# Patient Record
Sex: Male | Born: 1970
Health system: Southern US, Community
[De-identification: ages and names within clinical notes are randomized; demographics above are authoritative.]

## PROBLEM LIST (undated history)

## (undated) DIAGNOSIS — Z8042 Family history of malignant neoplasm of prostate: Secondary | ICD-10-CM

## (undated) DIAGNOSIS — E669 Obesity, unspecified: Secondary | ICD-10-CM

## (undated) DIAGNOSIS — K429 Umbilical hernia without obstruction or gangrene: Secondary | ICD-10-CM

## (undated) DIAGNOSIS — I10 Essential (primary) hypertension: Secondary | ICD-10-CM

## (undated) DIAGNOSIS — U071 COVID-19: Secondary | ICD-10-CM

## (undated) DIAGNOSIS — E7800 Pure hypercholesterolemia, unspecified: Secondary | ICD-10-CM

## (undated) DIAGNOSIS — Z8679 Personal history of other diseases of the circulatory system: Secondary | ICD-10-CM

## (undated) DIAGNOSIS — C20 Malignant neoplasm of rectum: Secondary | ICD-10-CM

## (undated) DIAGNOSIS — E119 Type 2 diabetes mellitus without complications: Secondary | ICD-10-CM

## (undated) DIAGNOSIS — Z803 Family history of malignant neoplasm of breast: Secondary | ICD-10-CM

## (undated) DIAGNOSIS — L4050 Arthropathic psoriasis, unspecified: Secondary | ICD-10-CM

## (undated) HISTORY — DX: Family history of malignant neoplasm of breast: Z80.3

## (undated) HISTORY — DX: Family history of malignant neoplasm of prostate: Z80.42

## (undated) HISTORY — PX: OTHER SURGICAL HISTORY: SHX169

## (undated) HISTORY — DX: Obesity, unspecified: E66.9

## (undated) HISTORY — DX: Essential (primary) hypertension: I10

## (undated) HISTORY — DX: Type 2 diabetes mellitus without complications: E11.9

---

## 2014-09-03 ENCOUNTER — Encounter: Payer: BC Managed Care – PPO | Attending: Physician Assistant

## 2014-09-03 VITALS — Ht 67.5 in | Wt 282.8 lb

## 2014-09-03 DIAGNOSIS — E119 Type 2 diabetes mellitus without complications: Secondary | ICD-10-CM | POA: Insufficient documentation

## 2014-09-03 DIAGNOSIS — Z713 Dietary counseling and surveillance: Secondary | ICD-10-CM | POA: Diagnosis not present

## 2014-09-03 NOTE — Progress Notes (Signed)
Patient was seen on 09/03/14 for the first of a series of three diabetes self-management courses at the [facility].  Patient Education Plan per assessed needs and concerns is to attend four course education program for Diabetes Self Management Education.  The following learning objectives were met by the patient during this class:  Describe diabetes  State some common risk factors for diabetes  Defines the role of glucose and insulin  Identifies type of diabetes and pathophysiology  Describe the relationship between diabetes and cardiovascular risk  State the members of the Healthcare Team  States the rationale for glucose monitoring  State when to test glucose  State their individual Target Range  State the importance of logging glucose readings  Describe how to interpret glucose readings  Identifies A1C target  Explain the correlation between A1c and eAG values  State symptoms and treatment of high blood glucose  State symptoms and treatment of low blood glucose  Explain proper technique for glucose testing  Identifies proper sharps disposal  Handouts given during class include:  Living Well with Diabetes book  Carb Counting and Meal Planning book  Meal Plan Card  Carbohydrate guide  Meal planning worksheet  Low Sodium Flavoring Tips  The diabetes portion plate  G8U to eAG Conversion Chart  Diabetes Medications  Diabetes Recommended Care Schedule  Support Group  Diabetes Success Plan  Core Class Satisfaction Survey  Follow-Up Plan:  Attend core 2

## 2014-09-04 ENCOUNTER — Ambulatory Visit
Admission: RE | Admit: 2014-09-04 | Discharge: 2014-09-04 | Disposition: A | Discharge status: Home / Self Care | Payer: BC Managed Care – PPO | Source: Ambulatory Visit | Attending: Physician Assistant | Admitting: Physician Assistant

## 2014-09-04 ENCOUNTER — Other Ambulatory Visit: Payer: Self-pay | Admitting: Physician Assistant

## 2014-09-04 DIAGNOSIS — R52 Pain, unspecified: Secondary | ICD-10-CM

## 2014-09-04 IMAGING — CR DG KNEE COMPLETE 4+V*R*
4 series · 4 of 4 positions shown · non-contrast
Comparison: None.

CLINICAL DATA: Pain and swelling.  Trauma 1 month prior

EXAM:
RIGHT KNEE - COMPLETE 4+ VIEW

[t knee ap right]
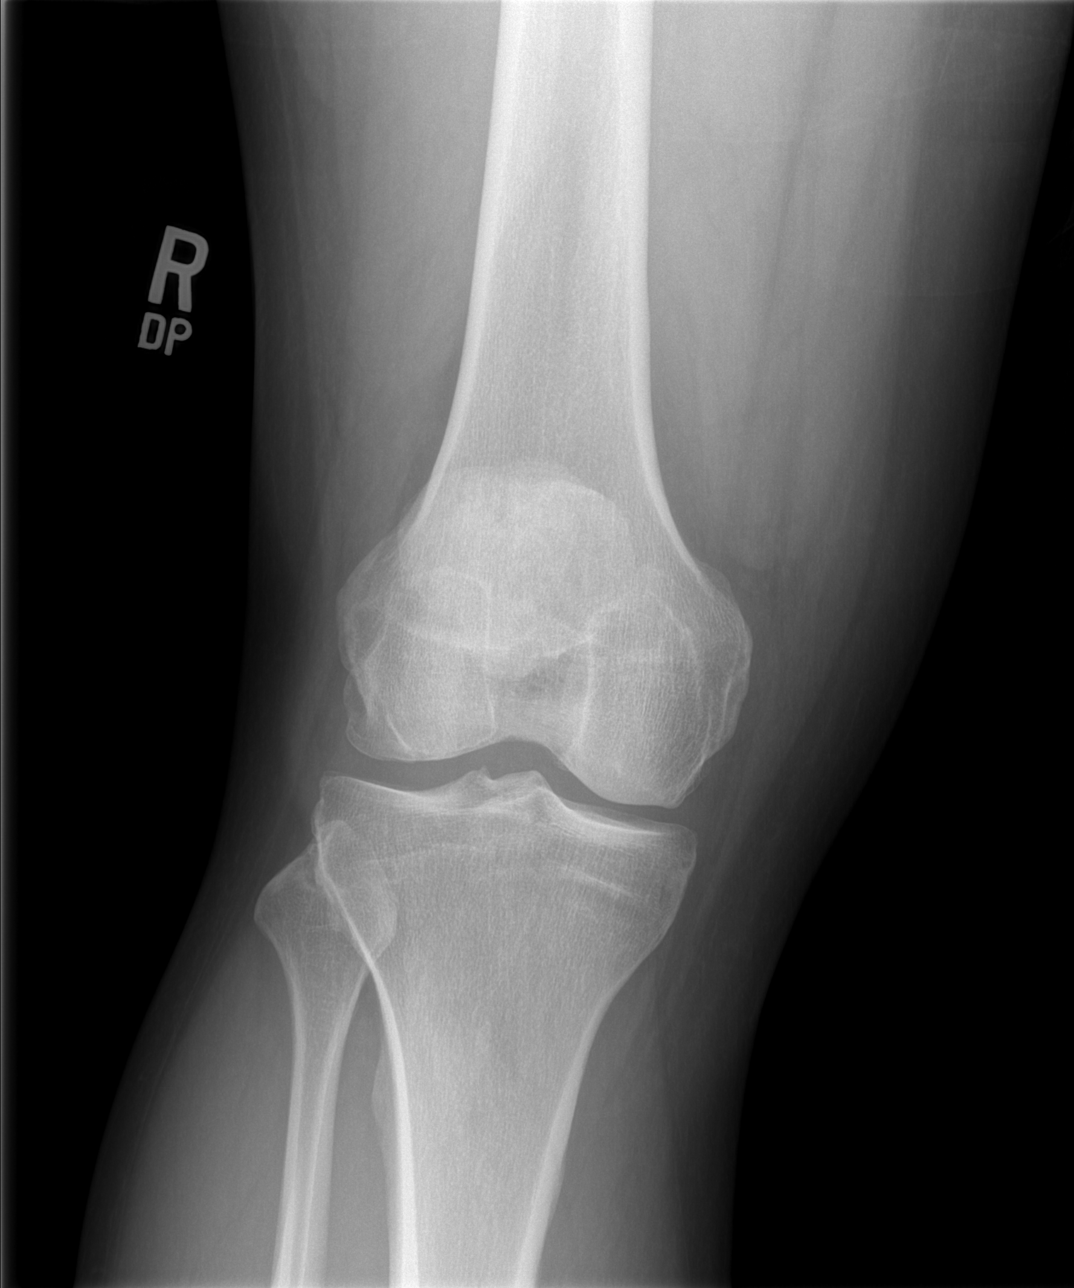

[t knee oblique right (1 of 2)]
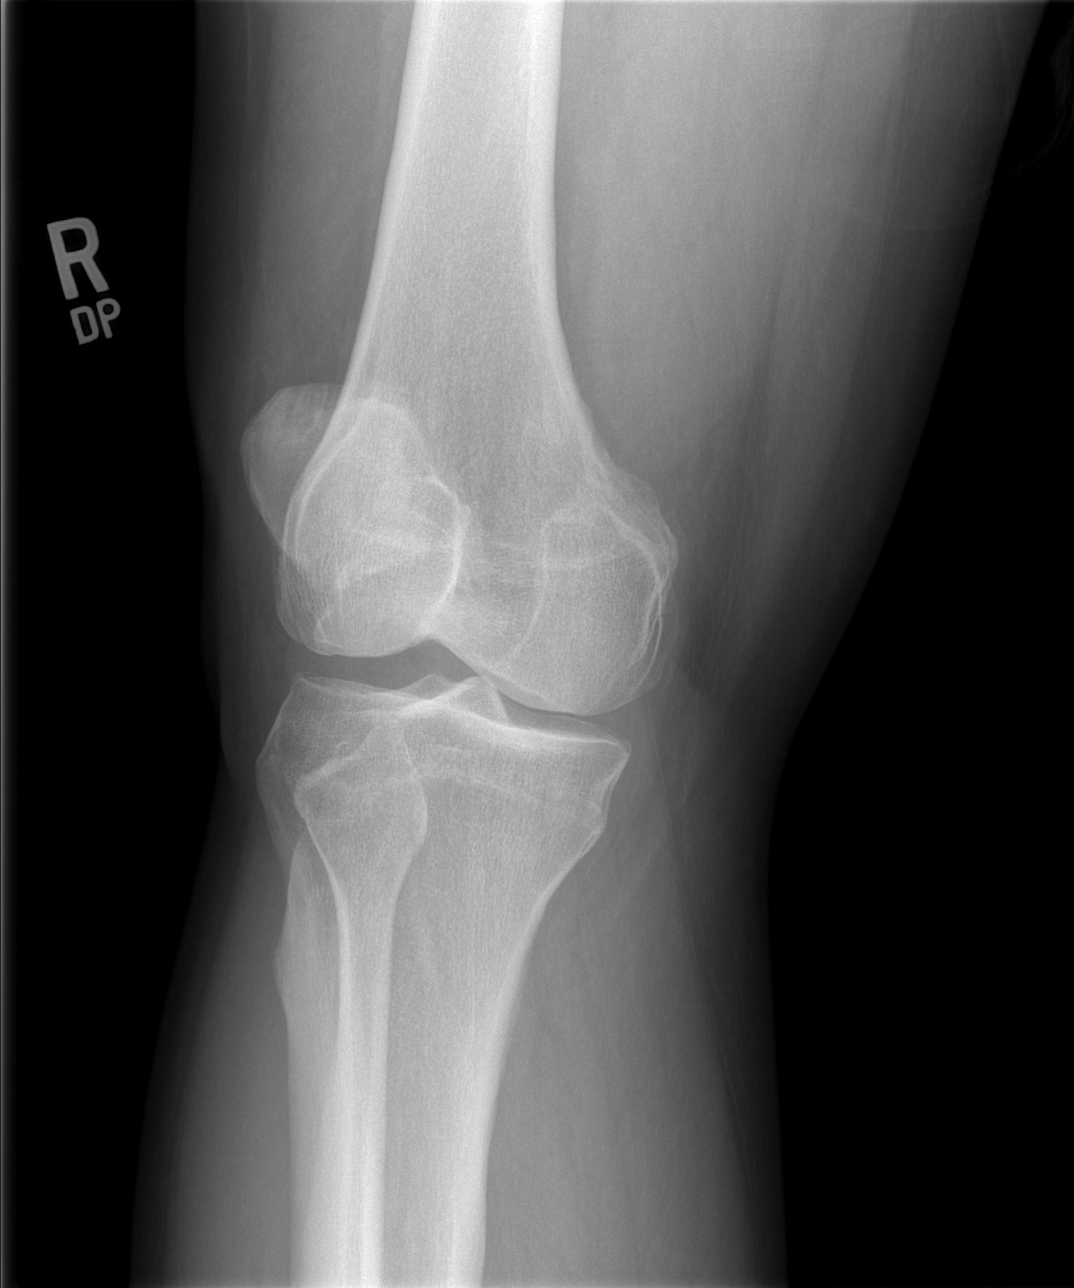

[t knee oblique right (2 of 2)]
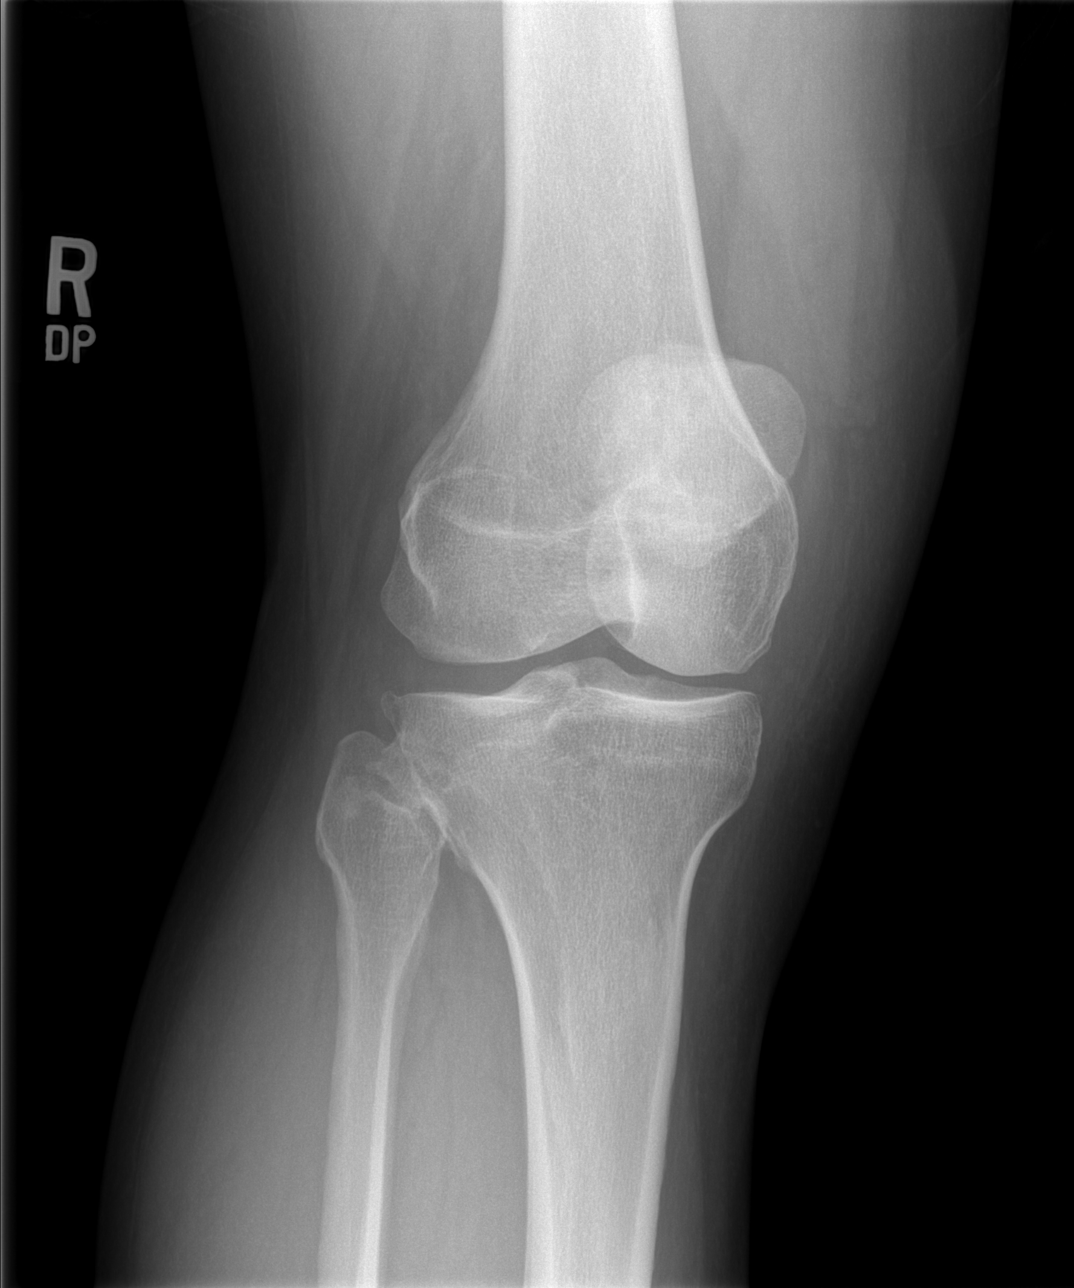

[t knee lat right]
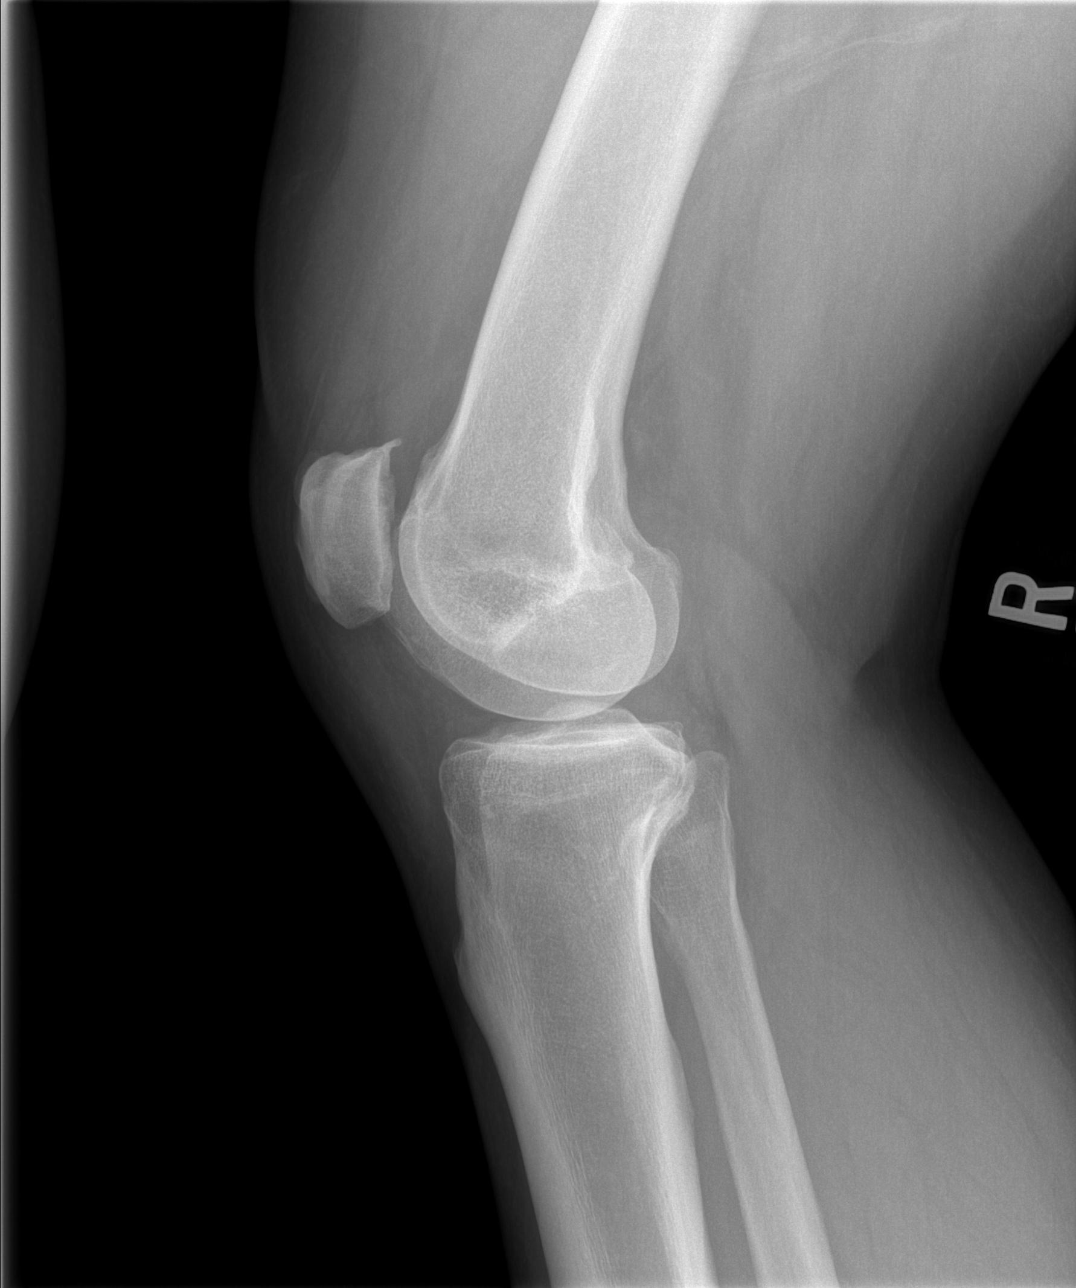

[4 of 4 positions shown; findings below may reference images not displayed]

FINDINGS: Frontal, lateral, and bilateral oblique views were obtained. There
is no fracture or dislocation. No appreciable knee effusion. There
is patellofemoral joint space narrowing with a spur arising from the
posterior superior patella. There is minimal narrowing medially.
There is slight spurring laterally. No erosive change.
IMPRESSION: Osteoarthritic change, primarily in the patellofemoral joint. No
fracture or effusion.

## 2014-09-10 ENCOUNTER — Encounter: Payer: BC Managed Care – PPO | Attending: *Deleted

## 2014-09-10 DIAGNOSIS — Z713 Dietary counseling and surveillance: Secondary | ICD-10-CM | POA: Diagnosis not present

## 2014-09-10 DIAGNOSIS — E119 Type 2 diabetes mellitus without complications: Secondary | ICD-10-CM | POA: Diagnosis present

## 2014-09-10 NOTE — Progress Notes (Signed)

## 2014-09-17 DIAGNOSIS — E119 Type 2 diabetes mellitus without complications: Secondary | ICD-10-CM | POA: Diagnosis not present

## 2014-09-23 NOTE — Progress Notes (Signed)
Patient was seen on 09/17/14 for the third of a series of three diabetes self-management courses at the [facility]. The following learning objectives were met by the patient during this class:  . State the amount of activity recommended for healthy living . Describe activities suitable for individual needs . Identify ways to regularly incorporate activity into daily life . Identify barriers to activity and ways to over come these barriers  Identify diabetes medications being personally used and their primary action for lowering glucose and possible side effects . Describe role of stress on blood glucose and develop strategies to address psychosocial issues . Identify diabetes complications and ways to prevent them  Explain how to manage diabetes during illness . Evaluate success in meeting personal goal . Establish 2-3 goals that they will plan to diligently work on until they return for the  19-monthfollow-up visit  Goals:   I will count my carb choices at most meals and snacks  I will be active 30 minutes or more 5 times a week  I will take my diabetes medications as scheduled  I will eat less unhealthy fats by eating less Fast Food  I will test my glucose at least 1 times a day, 6 days a week  I will look at patterns in my record book at least 4 days a month  Your patient has identified these potential barriers to change:  Motivation  Your patient has identified their diabetes self-care support plan as  Family Support On-line Resources  Plan:  Attend Core 4 in 4 months

## 2014-09-28 ENCOUNTER — Ambulatory Visit (HOSPITAL_BASED_OUTPATIENT_CLINIC_OR_DEPARTMENT_OTHER): Payer: BC Managed Care – PPO | Admitting: Anesthesiology

## 2014-09-28 ENCOUNTER — Ambulatory Visit (HOSPITAL_BASED_OUTPATIENT_CLINIC_OR_DEPARTMENT_OTHER)
Admission: RE | Admit: 2014-09-28 | Discharge: 2014-09-28 | Disposition: A | Discharge status: Home / Self Care | Payer: BC Managed Care – PPO | Source: Ambulatory Visit | Attending: Orthopedic Surgery | Admitting: Orthopedic Surgery

## 2014-09-28 ENCOUNTER — Encounter (HOSPITAL_BASED_OUTPATIENT_CLINIC_OR_DEPARTMENT_OTHER): Payer: Self-pay

## 2014-09-28 ENCOUNTER — Encounter (HOSPITAL_BASED_OUTPATIENT_CLINIC_OR_DEPARTMENT_OTHER)
Admission: RE | Disposition: A | Discharge status: Home / Self Care | Payer: Self-pay | Source: Ambulatory Visit | Attending: Orthopedic Surgery

## 2014-09-28 ENCOUNTER — Other Ambulatory Visit: Payer: Self-pay | Admitting: Orthopedic Surgery

## 2014-09-28 DIAGNOSIS — Y929 Unspecified place or not applicable: Secondary | ICD-10-CM | POA: Insufficient documentation

## 2014-09-28 DIAGNOSIS — Y999 Unspecified external cause status: Secondary | ICD-10-CM | POA: Diagnosis not present

## 2014-09-28 DIAGNOSIS — M25561 Pain in right knee: Secondary | ICD-10-CM | POA: Diagnosis present

## 2014-09-28 DIAGNOSIS — Z79899 Other long term (current) drug therapy: Secondary | ICD-10-CM | POA: Insufficient documentation

## 2014-09-28 DIAGNOSIS — Y939 Activity, unspecified: Secondary | ICD-10-CM | POA: Insufficient documentation

## 2014-09-28 DIAGNOSIS — X58XXXA Exposure to other specified factors, initial encounter: Secondary | ICD-10-CM | POA: Insufficient documentation

## 2014-09-28 DIAGNOSIS — M65861 Other synovitis and tenosynovitis, right lower leg: Secondary | ICD-10-CM | POA: Diagnosis not present

## 2014-09-28 DIAGNOSIS — E669 Obesity, unspecified: Secondary | ICD-10-CM | POA: Diagnosis not present

## 2014-09-28 DIAGNOSIS — E119 Type 2 diabetes mellitus without complications: Secondary | ICD-10-CM | POA: Diagnosis not present

## 2014-09-28 DIAGNOSIS — S83241A Other tear of medial meniscus, current injury, right knee, initial encounter: Secondary | ICD-10-CM | POA: Insufficient documentation

## 2014-09-28 HISTORY — PX: CHONDROPLASTY: SHX5177

## 2014-09-28 HISTORY — PX: SYNOVECTOMY: SHX5180

## 2014-09-28 HISTORY — PX: KNEE ARTHROSCOPY WITH MEDIAL MENISECTOMY: SHX5651

## 2014-09-28 LAB — SYNOVIAL CELL COUNT + DIFF, W/ CRYSTALS
CRYSTALS FLUID: NONE SEEN
Eosinophils-Synovial: 0 % (ref 0–1)
LYMPHOCYTES-SYNOVIAL FLD: 4 % (ref 0–20)
Monocyte-Macrophage-Synovial Fluid: 2 % — ABNORMAL LOW (ref 50–90)
Neutrophil, Synovial: 94 % — ABNORMAL HIGH (ref 0–25)
WBC, Synovial: 7000 /mm3 — ABNORMAL HIGH (ref 0–200)

## 2014-09-28 LAB — GRAM STAIN

## 2014-09-28 LAB — POCT I-STAT, CHEM 8
BUN: 10 mg/dL (ref 6–23)
Calcium, Ion: 1.03 mmol/L — ABNORMAL LOW (ref 1.12–1.23)
Chloride: 104 mEq/L (ref 96–112)
Creatinine, Ser: 0.8 mg/dL (ref 0.50–1.35)
Glucose, Bld: 146 mg/dL — ABNORMAL HIGH (ref 70–99)
HCT: 42 % (ref 39.0–52.0)
HEMOGLOBIN: 14.3 g/dL (ref 13.0–17.0)
Potassium: 3.9 mEq/L (ref 3.7–5.3)
Sodium: 136 mEq/L — ABNORMAL LOW (ref 137–147)
TCO2: 24 mmol/L (ref 0–100)

## 2014-09-28 LAB — GLUCOSE, CAPILLARY: GLUCOSE-CAPILLARY: 139 mg/dL — AB (ref 70–99)

## 2014-09-28 SURGERY — ARTHROSCOPY, KNEE, WITH MEDIAL MENISCECTOMY
Anesthesia: General | Site: Knee | Laterality: Right

## 2014-09-28 MED ORDER — BUPIVACAINE HCL (PF) 0.5 % IJ SOLN
INTRAMUSCULAR | Status: DC | PRN
  Administered 2014-09-28: 20 mL

## 2014-09-28 MED ORDER — LACTATED RINGERS IV SOLN
INTRAVENOUS | Status: DC
  Administered 2014-09-28: 15:00:00 via INTRAVENOUS

## 2014-09-28 MED ORDER — HYDROMORPHONE HCL 1 MG/ML IJ SOLN
INTRAMUSCULAR | Status: AC
  Filled 2014-09-28: qty 1

## 2014-09-28 MED ORDER — LIDOCAINE HCL (CARDIAC) 20 MG/ML IV SOLN
INTRAVENOUS | Status: DC | PRN
  Administered 2014-09-28: 50 mg via INTRAVENOUS

## 2014-09-28 MED ORDER — MIDAZOLAM HCL 5 MG/5ML IJ SOLN
INTRAMUSCULAR | Status: DC | PRN
  Administered 2014-09-28: 2 mg via INTRAVENOUS

## 2014-09-28 MED ORDER — FENTANYL CITRATE 0.05 MG/ML IJ SOLN
INTRAMUSCULAR | Status: DC | PRN
  Administered 2014-09-28 (×2): 50 ug via INTRAVENOUS
  Administered 2014-09-28: 100 ug via INTRAVENOUS

## 2014-09-28 MED ORDER — OXYCODONE HCL 5 MG PO TABS: 5.0000 mg | ORAL_TABLET | Freq: Once | ORAL | Status: DC | PRN

## 2014-09-28 MED ORDER — DEXTROSE 5 % IV SOLN
3.0000 g | INTRAVENOUS | Status: AC
  Administered 2014-09-28: 3 g via INTRAVENOUS

## 2014-09-28 MED ORDER — MIDAZOLAM HCL 2 MG/2ML IJ SOLN: 1.0000 mg | INTRAMUSCULAR | Status: DC | PRN

## 2014-09-28 MED ORDER — PROPOFOL 10 MG/ML IV BOLUS
INTRAVENOUS | Status: DC | PRN
  Administered 2014-09-28: 200 mg via INTRAVENOUS

## 2014-09-28 MED ORDER — DEXAMETHASONE SODIUM PHOSPHATE 10 MG/ML IJ SOLN
INTRAMUSCULAR | Status: DC | PRN
  Administered 2014-09-28: 10 mg via INTRAVENOUS

## 2014-09-28 MED ORDER — CEFAZOLIN SODIUM 1-5 GM-% IV SOLN
INTRAVENOUS | Status: AC
  Filled 2014-09-28: qty 50

## 2014-09-28 MED ORDER — ONDANSETRON HCL 4 MG/2ML IJ SOLN
INTRAMUSCULAR | Status: DC | PRN
  Administered 2014-09-28: 4 mg via INTRAVENOUS

## 2014-09-28 MED ORDER — FENTANYL CITRATE 0.05 MG/ML IJ SOLN
INTRAMUSCULAR | Status: AC
  Filled 2014-09-28: qty 4

## 2014-09-28 MED ORDER — CEFAZOLIN SODIUM-DEXTROSE 2-3 GM-% IV SOLR
INTRAVENOUS | Status: AC
  Filled 2014-09-28: qty 50

## 2014-09-28 MED ORDER — HYDROMORPHONE HCL 1 MG/ML IJ SOLN
0.2500 mg | INTRAMUSCULAR | Status: DC | PRN
  Administered 2014-09-28 (×2): 0.5 mg via INTRAVENOUS

## 2014-09-28 MED ORDER — DEXTROSE 5 % IV SOLN
3.0000 g | Freq: Once | INTRAVENOUS | Status: AC
  Administered 2014-09-28: 3 g via INTRAVENOUS

## 2014-09-28 MED ORDER — SODIUM CHLORIDE 0.9 % IR SOLN
Status: DC | PRN
  Administered 2014-09-28: 1

## 2014-09-28 MED ORDER — CHLORHEXIDINE GLUCONATE 4 % EX LIQD: 60.0000 mL | Freq: Once | CUTANEOUS | Status: DC

## 2014-09-28 MED ORDER — EPINEPHRINE HCL 1 MG/ML IJ SOLN
INTRAMUSCULAR | Status: DC | PRN
  Administered 2014-09-28: 1 mg via SUBCUTANEOUS

## 2014-09-28 MED ORDER — OXYCODONE HCL 5 MG/5ML PO SOLN: 5.0000 mg | Freq: Once | ORAL | Status: DC | PRN

## 2014-09-28 MED ORDER — PROMETHAZINE HCL 25 MG/ML IJ SOLN: 6.2500 mg | INTRAMUSCULAR | Status: DC | PRN

## 2014-09-28 MED ORDER — FENTANYL CITRATE 0.05 MG/ML IJ SOLN: 50.0000 ug | INTRAMUSCULAR | Status: DC | PRN

## 2014-09-28 MED ORDER — MIDAZOLAM HCL 2 MG/2ML IJ SOLN
INTRAMUSCULAR | Status: AC
  Filled 2014-09-28: qty 2

## 2014-09-28 MED ORDER — OXYCODONE-ACETAMINOPHEN 5-325 MG PO TABS: 1.0000 | ORAL_TABLET | Freq: Four times a day (QID) | ORAL | Status: DC | PRN

## 2014-09-28 SURGICAL SUPPLY — 41 items
BANDAGE ELASTIC 6 VELCRO ST LF (GAUZE/BANDAGES/DRESSINGS) ×2 IMPLANT
BLADE 4.2CUDA (BLADE) IMPLANT
BLADE GREAT WHITE 4.2 (BLADE) ×2 IMPLANT
CANISTER SUCT 3000ML (MISCELLANEOUS) IMPLANT
CUFF TOURNIQUET SINGLE 34IN LL (TOURNIQUET CUFF) ×1 IMPLANT
CUTTER MENISCUS  4.2MM (BLADE)
CUTTER MENISCUS 4.2MM (BLADE) IMPLANT
DRAPE ARTHROSCOPY W/POUCH 114 (DRAPES) ×2 IMPLANT
DRSG EMULSION OIL 3X3 NADH (GAUZE/BANDAGES/DRESSINGS) ×2 IMPLANT
DURAPREP 26ML APPLICATOR (WOUND CARE) ×2 IMPLANT
ELECT MENISCUS 165MM 90D (ELECTRODE) IMPLANT
ELECT REM PT RETURN 9FT ADLT (ELECTROSURGICAL)
ELECTRODE REM PT RTRN 9FT ADLT (ELECTROSURGICAL) IMPLANT
GAUZE SPONGE 4X4 12PLY STRL (GAUZE/BANDAGES/DRESSINGS) ×2 IMPLANT
GLOVE BIOGEL PI IND STRL 7.0 (GLOVE) IMPLANT
GLOVE BIOGEL PI IND STRL 8 (GLOVE) ×2 IMPLANT
GLOVE BIOGEL PI INDICATOR 7.0 (GLOVE) ×1
GLOVE BIOGEL PI INDICATOR 8 (GLOVE) ×2
GLOVE ECLIPSE 6.5 STRL STRAW (GLOVE) ×1 IMPLANT
GLOVE ECLIPSE 7.5 STRL STRAW (GLOVE) ×4 IMPLANT
GOWN STRL REUS W/ TWL LRG LVL3 (GOWN DISPOSABLE) ×1 IMPLANT
GOWN STRL REUS W/ TWL XL LVL3 (GOWN DISPOSABLE) ×1 IMPLANT
GOWN STRL REUS W/TWL LRG LVL3 (GOWN DISPOSABLE) ×4
GOWN STRL REUS W/TWL XL LVL3 (GOWN DISPOSABLE) ×4 IMPLANT
HOLDER KNEE FOAM BLUE (MISCELLANEOUS) ×2 IMPLANT
KNEE WRAP E Z 3 GEL PACK (MISCELLANEOUS) ×2 IMPLANT
MANIFOLD NEPTUNE II (INSTRUMENTS) IMPLANT
NDL SAFETY ECLIPSE 18X1.5 (NEEDLE) IMPLANT
NEEDLE HYPO 18GX1.5 SHARP (NEEDLE)
PACK ARTHROSCOPY DSU (CUSTOM PROCEDURE TRAY) ×2 IMPLANT
PACK BASIN DAY SURGERY FS (CUSTOM PROCEDURE TRAY) ×2 IMPLANT
PAD CAST 4YDX4 CTTN HI CHSV (CAST SUPPLIES) ×1 IMPLANT
PADDING CAST COTTON 4X4 STRL (CAST SUPPLIES) ×2
PENCIL BUTTON HOLSTER BLD 10FT (ELECTRODE) IMPLANT
SET ARTHROSCOPY TUBING (MISCELLANEOUS) ×2
SET ARTHROSCOPY TUBING LN (MISCELLANEOUS) ×1 IMPLANT
SUT ETHILON 4 0 PS 2 18 (SUTURE) IMPLANT
SYR 5ML LL (SYRINGE) ×2 IMPLANT
TOWEL OR 17X24 6PK STRL BLUE (TOWEL DISPOSABLE) ×2 IMPLANT
TOWEL OR NON WOVEN STRL DISP B (DISPOSABLE) ×2 IMPLANT
WATER STERILE IRR 1000ML POUR (IV SOLUTION) ×2 IMPLANT

## 2014-09-28 NOTE — H&P (Signed)
PREOPERATIVE H&P  Chief Complaint: r knee pain and swelling  HPI: Eric Lewis is a 43 y.o. male who presents for evaluation of r knee pain and swelling. It has been present for 10 days and has been worsening. He has failed conservative measures. Pt had large effusion after trauma and had 79K wbc in fluid with no crystals after treatment with steroid injection and oral abx effusion persisted and fluid showed 59K wbc with no crystals. Pain is rated as moderate.  Past Medical History  Diagnosis Date  . Diabetes mellitus without complication   . Obesity    History reviewed. No pertinent past surgical history. History   Social History  . Marital Status: Unknown    Spouse Name: N/A    Number of Children: N/A  . Years of Education: N/A   Social History Main Topics  . Smoking status: Never Smoker   . Smokeless tobacco: None  . Alcohol Use: No  . Drug Use: No  . Sexual Activity: No   Other Topics Concern  . None   Social History Narrative   History reviewed. No pertinent family history. No Known Allergies Prior to Admission medications   Medication Sig Start Date End Date Taking? Authorizing Provider  ciprofloxacin (CIPRO) 500 MG tablet Take 500 mg by mouth 2 (two) times daily.   Yes Historical Provider, MD  ibuprofen (ADVIL,MOTRIN) 200 MG tablet Take 800 mg by mouth every 6 (six) hours as needed.   Yes Historical Provider, MD  lisinopril (PRINIVIL,ZESTRIL) 10 MG tablet Take 10 mg by mouth daily.   Yes Historical Provider, MD  meloxicam (MOBIC) 15 MG tablet Take 15 mg by mouth daily.   Yes Historical Provider, MD  metFORMIN (GLUCOPHAGE) 500 MG tablet Take by mouth 2 (two) times daily with a meal.   Yes Historical Provider, MD  terbinafine (LAMISIL) 250 MG tablet Take 250 mg by mouth daily.   Yes Historical Provider, MD  tizanidine (ZANAFLEX) 2 MG capsule Take 2 mg by mouth 3 (three) times daily.   Yes Historical Provider, MD     Positive ROS: none  All other systems have been  reviewed and were otherwise negative with the exception of those mentioned in the HPI and as above.  Physical Exam: Filed Vitals:   09/28/14 1454  BP: 154/93  Pulse: 124  Temp: 97.5 F (36.4 C)  Resp: 20    General: Alert, no acute distress Cardiovascular: No pedal edema Respiratory: No cyanosis, no use of accessory musculature GI: No organomegaly, abdomen is soft and non-tender Skin: No lesions in the area of chief complaint Neurologic: Sensation intact distally Psychiatric: Patient is competent for consent with normal mood and affect Lymphatic: No axillary or cervical lymphadenopathy  MUSCULOSKELETAL: r knee: paiful effusuion with limited rom // no erythema or warmthd  Assessment/Plan: r knee scope Plan for Procedure(s): ARTHROSCOPY KNEE  The risks benefits and alternatives were discussed with the patient including but not limited to the risks of nonoperative treatment, versus surgical intervention including infection, bleeding, nerve injury, malunion, nonunion, hardware prominence, hardware failure, need for hardware removal, blood clots, cardiopulmonary complications, morbidity, mortality, among others, and they were willing to proceed.  Predicted outcome is good, although there will be at least a six to nine month expected recovery.  Alta Corning, MD 09/28/2014 3:40 PM

## 2014-09-28 NOTE — Brief Op Note (Signed)
09/28/2014  5:35 PM  PATIENT:  Eric Lewis  43 y.o. male  PRE-OPERATIVE DIAGNOSIS:  r knee scope  POST-OPERATIVE DIAGNOSIS:  * No post-op diagnosis entered *  PROCEDURE:  Procedure(s): ARTHROSCOPY KNEE (Right)  SURGEON:  Surgeon(s) and Role:    * Alta Corning, MD - Primary  PHYSICIAN ASSISTANT:   ASSISTANTS: bethune   ANESTHESIA:   general  EBL:  Total I/O In: 1200 [I.V.:1200] Out: -   BLOOD ADMINISTERED:none  DRAINS: none   LOCAL MEDICATIONS USED:  MARCAINE     SPECIMEN:  Source of Specimen:  r knee synovium  DISPOSITION OF SPECIMEN:  PATHOLOGY  COUNTS:  YES  TOURNIQUET:    DICTATION: .Other Dictation: Dictation Number [id number]  PLAN OF CARE: Discharge to home after PACU  PATIENT DISPOSITION:  PACU - hemodynamically stable.   Delay start of Pharmacological VTE agent (>24hrs) due to surgical blood loss or risk of bleeding: no

## 2014-09-28 NOTE — Anesthesia Preprocedure Evaluation (Addendum)
Anesthesia Evaluation  Patient identified by MRN, date of birth, ID band Patient awake    Reviewed: Allergy & Precautions, H&P , NPO status , Patient's Chart, lab work & pertinent test results  Airway Mallampati: III  TM Distance: >3 FB Neck ROM: Full    Dental  (+) Teeth Intact, Dental Advisory Given   Pulmonary neg pulmonary ROS,  breath sounds clear to auscultation        Cardiovascular hypertension, Pt. on medications Rhythm:Regular Rate:Tachycardia     Neuro/Psych negative neurological ROS  negative psych ROS   GI/Hepatic negative GI ROS, Neg liver ROS,   Endo/Other  diabetes, Type 2, Oral Hypoglycemic AgentsMorbid obesity  Renal/GU negative Renal ROS     Musculoskeletal negative musculoskeletal ROS (+)   Abdominal   Peds  Hematology negative hematology ROS (+)   Anesthesia Other Findings   Reproductive/Obstetrics                            Anesthesia Physical Anesthesia Plan  ASA: III  Anesthesia Plan: General   Post-op Pain Management:    Induction: Intravenous  Airway Management Planned: LMA  Additional Equipment:   Intra-op Plan:   Post-operative Plan: Extubation in OR  Informed Consent: I have reviewed the patients History and Physical, chart, labs and discussed the procedure including the risks, benefits and alternatives for the proposed anesthesia with the patient or authorized representative who has indicated his/her understanding and acceptance.   Dental advisory given  Plan Discussed with: CRNA  Anesthesia Plan Comments:         Anesthesia Quick Evaluation

## 2014-09-28 NOTE — Transfer of Care (Signed)
Immediate Anesthesia Transfer of Care Note  Patient: Eric Lewis  Procedure(s) Performed: Procedure(s): ARTHROSCOPY KNEE (Right)  Patient Location: PACU  Anesthesia Type:General  Level of Consciousness: awake and patient cooperative  Airway & Oxygen Therapy: Patient Spontanous Breathing and Patient connected to face mask oxygen  Post-op Assessment: Report given to PACU RN and Post -op Vital signs reviewed and stable  Post vital signs: Reviewed and stable  Complications: No apparent anesthesia complications

## 2014-09-28 NOTE — Discharge Instructions (Signed)
POST-OP KNEE ARTHROSCOPY INSTRUCTIONS  Dr. Alain Marion PA-C  Pain You will be expected to have a moderate amount of pain in the affected knee for approximately two weeks. However, the first two days will be the most severe pain. A prescription has been provided to take as needed for the pain. The pain can be reduced by applying ice packs to the knee for the first 1-2 weeks post surgery. Also, keeping the leg elevated on pillows will help alleviate the pain. If you develop any acute pain or swelling in your calf muscle, please call the doctor.  Activity It is preferred that you stay at bed rest for approximately 24 hours. However, you may go to the bathroom with help. Weight bearing as tolerated. You may begin the knee exercises the day of surgery. Discontinue crutches as the knee pain resolves.  Dressing Keep the dressing dry. If the ace bandage should wrinkle or roll up, this can be rewrapped to prevent ridges in the bandage. You may remove all dressings in 48 hours,  apply bandaids to each wound. You may shower on the 4th day after surgery but no tub bath.  Symptoms to report to your doctor Extreme pain Extreme swelling Temperature above 101 degrees Change in the feeling, color, or movement of your toes Redness, heat, or swelling at your incision  Exercise If is preferred that as soon as possible you try to do a straight leg raise without bending the knee and concentrate on bringing the heel of your foot off the bed up to approximately 45 degrees and hold for the count of 10 seconds. Repeat this at least 10 times three or four times per day. Additional exercises are provided below.  You are encouraged to bend the knee as tolerated.  Follow-Up Call to schedule a follow-up appointment in 5-7 days. Our office # is [phone].  POST-OP EXERCISES  Short Arc Quads  1. Lie on back with legs straight. Place towel roll under thigh, just above knee. 2. Tighten thigh muscles to  straighten knee and lift heel off bed. 3. Hold for slow count of five, then lower. 4. Do three sets of ten    Straight Leg Raises  1. Lie on back with operative leg straight and other leg bent. 2. Keeping operative leg completely straight, slowly lift operative leg so foot is 5 inches off bed. 3. Hold for slow count of five, then lower. 4. Do three sets of ten.    DO BOTH EXERCISES 2 TIMES A DAY  Ankle Pumps  Work/move the operative ankle and foot up and down 10 times every hour while awake.POST-OP KNEE ARTHROSCOPY INSTRUCTIONS  Dr. Alain Marion PA-C  Pain You will be expected to have a moderate amount of pain in the affected knee for approximately two weeks. However, the first two days will be the most severe pain. A prescription has been provided to take as needed for the pain. The pain can be reduced by applying ice packs to the knee for the first 1-2 weeks post surgery. Also, keeping the leg elevated on pillows will help alleviate the pain. If you develop any acute pain or swelling in your calf muscle, please call the doctor.  Activity It is preferred that you stay at bed rest for approximately 24 hours. However, you may go to the bathroom with help. Weight bearing as tolerated. You may begin the knee exercises the day of surgery. Discontinue crutches as the knee pain resolves.  Dressing Keep the  dressing dry. If the ace bandage should wrinkle or roll up, this can be rewrapped to prevent ridges in the bandage. You may remove all dressings in 48 hours,  apply bandaids to each wound. You may shower on the 4th day after surgery but no tub bath.  Symptoms to report to your doctor Extreme pain Extreme swelling Temperature above 101 degrees Change in the feeling, color, or movement of your toes Redness, heat, or swelling at your incision  Exercise If is preferred that as soon as possible you try to do a straight leg raise without bending the knee and concentrate on  bringing the heel of your foot off the bed up to approximately 45 degrees and hold for the count of 10 seconds. Repeat this at least 10 times three or four times per day. Additional exercises are provided below.  You are encouraged to bend the knee as tolerated.  Follow-Up Call to schedule a follow-up appointment in 5-7 days. Our office # is [phone].  POST-OP EXERCISES  Short Arc Quads  1. Lie on back with legs straight. Place towel roll under thigh, just above knee. 2. Tighten thigh muscles to straighten knee and lift heel off bed. 3. Hold for slow count of five, then lower. 4. Do three sets of ten    Straight Leg Raises  1. Lie on back with operative leg straight and other leg bent. 2. Keeping operative leg completely straight, slowly lift operative leg so foot is 5 inches off bed. 3. Hold for slow count of five, then lower. 4. Do three sets of ten.    DO BOTH EXERCISES 2 TIMES A DAY  Ankle Pumps  Work/move the operative ankle and foot up and down 10 times every hour while awake.   Post Anesthesia Home Care Instructions  Activity: Get plenty of rest for the remainder of the day. A responsible adult should stay with you for 24 hours following the procedure.  For the next 24 hours, DO NOT: -Drive a car -Paediatric nurse -Drink alcoholic beverages -Take any medication unless instructed by your physician -Make any legal decisions or sign important papers.  Meals: Start with liquid foods such as gelatin or soup. Progress to regular foods as tolerated. Avoid greasy, spicy, heavy foods. If nausea and/or vomiting occur, drink only clear liquids until the nausea and/or vomiting subsides. Call your physician if vomiting continues.  Special Instructions/Symptoms: Your throat may feel dry or sore from the anesthesia or the breathing tube placed in your throat during surgery. If this causes discomfort, gargle with warm salt water. The discomfort should disappear within 24  hours.

## 2014-09-28 NOTE — Progress Notes (Signed)
   09/28/14 1504  OBSTRUCTIVE SLEEP APNEA  Score 4 or greater  Results sent to PCP

## 2014-09-28 NOTE — Anesthesia Procedure Notes (Signed)
Procedure Name: LMA Insertion Date/Time: 09/28/2014 4:52 PM Performed by: Lieutenant Diego Pre-anesthesia Checklist: Patient identified, Emergency Drugs available, Suction available and Patient being monitored Patient Re-evaluated:Patient Re-evaluated prior to inductionOxygen Delivery Method: Circle System Utilized Preoxygenation: Pre-oxygenation with 100% oxygen Intubation Type: IV induction Ventilation: Mask ventilation without difficulty LMA: LMA inserted LMA Size: 5.0 Number of attempts: 1 Airway Equipment and Method: bite block Placement Confirmation: positive ETCO2 and breath sounds checked- equal and bilateral Tube secured with: Tape Dental Injury: Teeth and Oropharynx as per pre-operative assessment

## 2014-09-29 NOTE — Anesthesia Postprocedure Evaluation (Signed)
  Anesthesia Post-op Note  Patient: Eric Lewis  Procedure(s) Performed: Procedure(s): KNEE ARTHROSCOPY WITH MEDIAL MENISECTOMY (Right) CHONDROPLASTY, PATELLA/FEMORAL (Right) SYNOVECTOMY, THREE COMPARTMENT (Right)  Patient Location: PACU  Anesthesia Type:General  Level of Consciousness: awake and alert   Airway and Oxygen Therapy: Patient Spontanous Breathing  Post-op Pain: none  Post-op Assessment: Post-op Vital signs reviewed  Post-op Vital Signs: Reviewed  Last Vitals:  Filed Vitals:   09/28/14 1911  BP: 145/90  Pulse: 110  Temp: 36.8 C  Resp: 16    Complications: No apparent anesthesia complications

## 2014-09-30 ENCOUNTER — Encounter (HOSPITAL_BASED_OUTPATIENT_CLINIC_OR_DEPARTMENT_OTHER): Payer: Self-pay | Admitting: Orthopedic Surgery

## 2014-09-30 ENCOUNTER — Telehealth: Payer: Self-pay | Admitting: *Deleted

## 2014-09-30 ENCOUNTER — Ambulatory Visit (INDEPENDENT_AMBULATORY_CARE_PROVIDER_SITE_OTHER): Payer: BC Managed Care – PPO | Admitting: Internal Medicine

## 2014-09-30 ENCOUNTER — Other Ambulatory Visit: Payer: Self-pay | Admitting: Pharmacist Clinician (PhC)/ Clinical Pharmacy Specialist

## 2014-09-30 VITALS — BP 128/86 | HR 109 | Temp 98.3°F | Ht 68.5 in | Wt 276.0 lb

## 2014-09-30 DIAGNOSIS — E785 Hyperlipidemia, unspecified: Secondary | ICD-10-CM

## 2014-09-30 DIAGNOSIS — E119 Type 2 diabetes mellitus without complications: Secondary | ICD-10-CM

## 2014-09-30 DIAGNOSIS — E669 Obesity, unspecified: Secondary | ICD-10-CM | POA: Diagnosis not present

## 2014-09-30 DIAGNOSIS — M009 Pyogenic arthritis, unspecified: Secondary | ICD-10-CM

## 2014-09-30 LAB — COMPREHENSIVE METABOLIC PANEL
ALK PHOS: 96 U/L (ref 39–117)
ALT: 52 U/L (ref 0–53)
AST: 25 U/L (ref 0–37)
Albumin: 3.7 g/dL (ref 3.5–5.2)
BUN: 11 mg/dL (ref 6–23)
CO2: 28 mEq/L (ref 19–32)
Calcium: 9.1 mg/dL (ref 8.4–10.5)
Chloride: 99 mEq/L (ref 96–112)
Creat: 0.78 mg/dL (ref 0.50–1.35)
GLUCOSE: 161 mg/dL — AB (ref 70–99)
POTASSIUM: 4.7 meq/L (ref 3.5–5.3)
SODIUM: 138 meq/L (ref 135–145)
TOTAL PROTEIN: 6.4 g/dL (ref 6.0–8.3)
Total Bilirubin: 0.3 mg/dL (ref 0.2–1.2)

## 2014-09-30 LAB — CBC
HEMATOCRIT: 37.5 % — AB (ref 39.0–52.0)
HEMOGLOBIN: 12.6 g/dL — AB (ref 13.0–17.0)
MCH: 27.7 pg (ref 26.0–34.0)
MCHC: 33.6 g/dL (ref 30.0–36.0)
MCV: 82.4 fL (ref 78.0–100.0)
MPV: 8.1 fL — ABNORMAL LOW (ref 9.4–12.4)
Platelets: 399 10*3/uL (ref 150–400)
RBC: 4.55 MIL/uL (ref 4.22–5.81)
RDW: 13.6 % (ref 11.5–15.5)
WBC: 10.5 10*3/uL (ref 4.0–10.5)

## 2014-09-30 LAB — C-REACTIVE PROTEIN: CRP: 4.5 mg/dL — ABNORMAL HIGH (ref <0.60)

## 2014-09-30 MED ORDER — ORITAVANCIN DIPHOSPHATE 400 MG IV SOLR
1200.0000 mg | Freq: Once | INTRAVENOUS | Status: AC
  Administered 2014-10-02: 1200 mg via INTRAVENOUS
  Filled 2014-09-30: qty 120

## 2014-09-30 MED ORDER — ORITAVANCIN DIPHOSPHATE 400 MG IV SOLR
1200.0000 mg | Freq: Once | INTRAVENOUS | Status: DC
Start: 2014-09-30 — End: 2014-09-30

## 2014-09-30 MED ORDER — VANCOMYCIN HCL 1000 MG IV SOLR: 1000.0000 mg | Freq: Two times a day (BID) | INTRAVENOUS | Status: DC

## 2014-09-30 NOTE — Op Note (Signed)
NAME:  Eric Lewis, NICOLINI NO.:  [id number]  MEDICAL RECORD NO.:  [id number]  LOCATION:                               FACILITY:  [facility]  PHYSICIAN:  Alta Corning, M.D.   DATE OF BIRTH:  July 11, 1971  DATE OF PROCEDURE:  09/28/2014 DATE OF DISCHARGE:  09/28/2014                              OPERATIVE REPORT   PREOPERATIVE DIAGNOSIS:  Chondral degeneration within the right knee in a patient who is thought to have an inflammatory process and much less likely infectious process.  POSTOPERATIVE DIAGNOSES: 1. Inflammatory process, right knee. 2. Medial meniscal tear. 3. Chondromalacia patellofemoral joint.  PROCEDURE: 1. Chondroplasty patellofemoral joint. 2. Partial posteromedial meniscectomy. 3. Four-compartment synovectomy.  SURGEON:  Alta Corning, M.D.  ASSISTANT:  Gary Fleet, P.A.  ANESTHESIA:  General.  BRIEF HISTORY:  [name] is a 43 year old male with a long history of significant complaints of trauma to his right knee.  Following this, he presented with a large effusion, which we aspirated, it had sort of a yellowish appearance with 79,000 white cells.  We were concerned, thought this was gout, we had injected it with cortisone, he did well, but his reviewed effusion recurred.  A 2nd aspiration showed 59,000 white cells both with 92% neutrophils.  There was no crystals in the second fluid aspirate as well.  My partner saw him over the weekend, he continued to look pretty good.  Repeat aspiration revealed 59,000 white cells as we had outlined and at this point, I had him come in today just to evaluate him.  His knee again had recurrent effusion.  He was bending easily to 90 degrees.  He was again afebrile and we had a long discussion of treatment options, felt probably the most conservative option was to wash him out, redo fluid culture, redo cell count, and just get a sense of what was going on, certainly likely possibility turned out to be an  infection.  He had been placed on oral Cipro after the 2nd aspirate and both of those aspirations had not grown anything positive in the culture arena.  We brought him to the operating room for operative knee arthroscopy.  DESCRIPTION OF PROCEDURE:  The patient was brought to the operating room.  After adequate anesthesia was obtained with general anesthetic, the patient was placed supine on the operating room table.  Right leg was prepped and draped in usual sterile fashion.  Following this, routine arthroscopic examination of the knee revealed there was obvious synovium, which seemed to be proliferative, this synovial tissue was biopsied and sent to Pathology.  We took fluid out of the knee and sent this for Gram stain, culture, crystals, AFB, fungal, and cell counts as well as protein and glucose from the fluid.  Once this was completed, the knee underwent routine arthroscopy, there was some chondromalacia of the patellofemoral joint, but nothing dramatic.  There was some inflamed synovium, but certainly did not appear to be an infectious thing and did not appear to be aggressive into the articular cartilage.  The articular cartilage looked and felt pretty normal.  He did have some fray of the posterior horn of the meniscus,  which was debrided.  Lateral side looked normal.  There was some synovium both medial gutter, lateral gutter, anteriorly, and superiorly; all of this was debrided.  Once that was completed, the knee was copiously and thoroughly lavaged with 6 L of normal saline irrigation and suctioned dry.  At this point, the patient was taken to the recovery room and he was noted to be in a satisfactory condition.  3 g of Ancef was given intraoperatively and I think we will give him another dosage of antibiotics prior to discharge, and I am going to discuss this case with Infectious Disease, but I do not think it is an infectious etiology, but certainly I feel that we will now  wash the knee out, we have given him antibiotics, we have sent again cultures and synovial biopsies, so I think with the aggressive synovectomy and washout, I think he should be fine.  He was taken to the recovery room after sterile compressive dressing was applied.  20 mL of Marcaine was instilled for postoperative anesthesia where he was noted to be in a satisfactory condition.     Alta Corning, M.D.     Corliss Skains  D:  09/28/2014  T:  09/29/2014  Job:  [id number]

## 2014-09-30 NOTE — Telephone Encounter (Signed)
Spoke with Roselyn Reef A at [facility]. Per insurance, no PA required for outpatient infusion of oritavancin (J3490 or J3590).  Per insurance, no PA required for oritavancin either. Faxed orders to short-stay for one time dose of 1200mg  oritavancin via peripheral IV to be given Friday, 11/27 at 8:00.  Patient notified, accepted appointment.  As [facility] will be closed Friday, short stay given Dr. Hale Bogus pager number per his request if there are any issues. Landis Gandy, RN

## 2014-09-30 NOTE — Progress Notes (Signed)
Patient ID: Eric Lewis, male   DOB: [birth year], 43 y.o.   MRN: [id number]         [facility]ious Disease  Reason for Consult: Septic arthritis of right knee Referring Physician: Dr. Dorna Leitz  Patient Active Problem List   Diagnosis Date Noted  . Septic arthritis of knee, right 09/30/2014    Priority: High  . Diabetes mellitus 09/30/2014  . Dyslipidemia 09/30/2014  . Obesity 09/30/2014    Patient's Medications  New Prescriptions   VANCOMYCIN 1,000 MG IN SODIUM CHLORIDE 0.9 % 250 ML    Inject 1,000 mg into the vein every 12 (twelve) hours.  Previous Medications   CIPROFLOXACIN (CIPRO) 500 MG TABLET    Take 500 mg by mouth 2 (two) times daily.   LISINOPRIL (PRINIVIL,ZESTRIL) 10 MG TABLET    Take 10 mg by mouth daily.   MELOXICAM (MOBIC) 15 MG TABLET    Take 1 tablet by mouth daily.   METFORMIN (GLUCOPHAGE) 500 MG TABLET    Take by mouth 2 (two) times daily with a meal.   OXYCODONE-ACETAMINOPHEN (PERCOCET/ROXICET) 5-325 MG PER TABLET    Take 1-2 tablets by mouth every 6 (six) hours as needed for severe pain.   TERBINAFINE (LAMISIL) 250 MG TABLET    Take 250 mg by mouth daily.  Modified Medications   No medications on file  Discontinued Medications   TIZANIDINE (ZANAFLEX) 2 MG CAPSULE    Take 2 mg by mouth 3 (three) times daily.    Recommendations: 1. Arrange a single dose of oritavancin 1200 mg IV at [facility] as soon as possible 2. Continue ciprofloxacin until oritavancin administered 3. Follow-up here in 2 weeks   Assessment: Even though his cultures have been negative I suspect that he has streptococcal or staphylococcal septic arthritis. I would normally start him on IV vancomycin at this time. However, even after numerous phone calls it appears that there will be a four day delay in getting a PICC placed and making arrangements for home IV vancomycin given the upcoming Thanksgiving holiday. An alternative will be to give him a single dose of IV  oritavancin at [facility]. This can be given through a temporary saline lock IV. Oritavancin has the advantage of being very long-acting and should give therapeutic levels for 2-3 weeks which should be sufficient to treat his septic arthritis.   HPI: Eric Lewis is a 43 y.o. male diabetic who developed problems with back pain and spasms in September. One evening in late September he developed severe back spasms while laying in bed. He stood up suddenly and blacked out. He fell between the bed and his bedroom wall. When he woke up he noticed that it his knee had punched a hole in the wall. Several hours later he had intense pain on the medial aspect of his right knee. Several weeks later he began to have swelling and warmth of that knee. He was seen by Dr. Berenice Primas on November 9. Murky fluid was aspirated. The white blood cell count was 79,000. No crystals were seen. There were no organisms on Gram stain and cultures were negative. He was given a steroid injection and felt better for a few days. However the fluid quickly reaccumulated. He had a second aspiration but the fluid was not picked up to go to the lab. He returned a third time on November 18 and had fluid reaspirated. Again, no organisms were seen on Gram stain and cultures were negative. He was  started on ciprofloxacin that day. The fluid reaccumulated and he was taken to surgery 2 days ago. Intense synovitis was noted clinically and on pathologic exam. Gram stain of synovial fluid revealed gram-positive cocci in pairs. Cultures are no growth to date.  Review of Systems: Constitutional: positive for chills and malaise, negative for anorexia, fevers, sweats and weight loss Eyes: negative Ears, nose, mouth, throat, and face: negative Respiratory: negative Cardiovascular: negative Gastrointestinal: negative Genitourinary:negative    Past Medical History  Diagnosis Date  . Diabetes mellitus without complication   . Obesity      History  Substance Use Topics  . Smoking status: Never Smoker   . Smokeless tobacco: Not on file  . Alcohol Use: No    No family history on file. No Known Allergies  OBJECTIVE: Blood pressure 128/86, pulse 109, temperature 98.3 F (36.8 C), temperature source Oral, height 5' 8.5" (1.74 m), weight 276 lb (125.193 kg). General: He is in no distress Skin: No rash Lungs: Clear Cor: Regular S1 and S2 with no murmurs Abdomen: Obese, soft and nontender Right leg: Ace wrap dressing  Microbiology: Recent Results (from the past 240 hour(s))  Anaerobic culture     Status: None (Preliminary result)   Collection Time: 09/28/14  5:55 PM  Result Value Ref Range Status   Specimen Description SYNOVIAL RIGHT KNEE  Final   Special Requests NONE  Final   Gram Stain   Final    MODERATE WBC PRESENT, PREDOMINANTLY PMN NO ORGANISMS SEEN Performed at [facility]    Culture   Final    NO ANAEROBES ISOLATED; CULTURE IN PROGRESS FOR 5 DAYS Performed at [facility]    Report Status PENDING  Incomplete  Fungus Culture with Smear     Status: None (Preliminary result)   Collection Time: 09/28/14  5:55 PM  Result Value Ref Range Status   Specimen Description SYNOVIAL RIGHT KNEE  Final   Special Requests NONE  Final   Fungal Smear   Final    NO YEAST OR FUNGAL ELEMENTS SEEN Performed at [facility]    Culture   Final    CULTURE IN PROGRESS FOR FOUR WEEKS Performed at [facility]    Report Status PENDING  Incomplete  Gram stain     Status: None   Collection Time: 09/28/14  5:55 PM  Result Value Ref Range Status   Specimen Description SYNOVIAL RIGHT KNEE  Final   Special Requests NONE  Final   Gram Stain   Final    ABUNDANT WBC PRESENT,BOTH PMN AND MONONUCLEAR RARE GRAM POSITIVE COCCI IN PAIRS Gram Stain Report Called to,Read Back By and Verified With: DR. Berenice Primas [id number] 09/28/14 M.Rhilynn Preyer    Report Status 09/28/2014 FINAL  Final  Body fluid culture      Status: None (Preliminary result)   Collection Time: 09/28/14  5:55 PM  Result Value Ref Range Status   Specimen Description SYNOVIAL RIGHT KNEE  Final   Special Requests NONE  Final   Gram Stain   Final    MODERATE WBC PRESENT, PREDOMINANTLY PMN NO ORGANISMS SEEN Performed at [facility]    Culture   Final    NO GROWTH 1 DAY Performed at [facility]    Report Status PENDING  Incomplete  AFB culture with smear     Status: None (Preliminary result)   Collection Time: 09/28/14  5:55 PM  Result Value Ref Range Status   Specimen Description SYNOVIAL RIGHT KNEE  Final   Special Requests NONE  Final   Acid Fast Smear   Final    NO ACID FAST BACILLI SEEN Performed at [facility]    Culture   Final    CULTURE WILL BE EXAMINED FOR 6 WEEKS BEFORE ISSUING A FINAL REPORT Performed at [facility]    Report Status PENDING  Incomplete    Michel Bickers, MD [facility] for [place] [facility] [phone] pager   [phone] cell 09/30/2014, 2:28 PM

## 2014-10-01 LAB — SEDIMENTATION RATE: SED RATE: 51 mm/h — AB (ref 0–16)

## 2014-10-02 ENCOUNTER — Ambulatory Visit (HOSPITAL_COMMUNITY)
Admission: RE | Admit: 2014-10-02 | Discharge: 2014-10-02 | Disposition: A | Discharge status: Home / Self Care | Payer: BC Managed Care – PPO | Source: Ambulatory Visit | Attending: Internal Medicine | Admitting: Internal Medicine

## 2014-10-02 DIAGNOSIS — M009 Pyogenic arthritis, unspecified: Secondary | ICD-10-CM | POA: Insufficient documentation

## 2014-10-02 LAB — BODY FLUID CULTURE: Culture: NO GROWTH

## 2014-10-03 LAB — ANAEROBIC CULTURE

## 2014-10-05 LAB — GLUCOSE, SYNOVIAL FLUID: Glucose, Synovial Fluid: 80 mg/dL

## 2014-10-05 LAB — PROTEIN, SYNOVIAL FLUID: PROTEIN, SYNOVIAL FLUID: 5.2 g/dL — AB (ref 1.0–3.0)

## 2014-10-14 ENCOUNTER — Encounter: Payer: Self-pay | Admitting: Internal Medicine

## 2014-10-14 ENCOUNTER — Ambulatory Visit (INDEPENDENT_AMBULATORY_CARE_PROVIDER_SITE_OTHER): Payer: BC Managed Care – PPO | Admitting: Internal Medicine

## 2014-10-14 DIAGNOSIS — M009 Pyogenic arthritis, unspecified: Secondary | ICD-10-CM

## 2014-10-14 LAB — C-REACTIVE PROTEIN: CRP: 4.1 mg/dL — ABNORMAL HIGH (ref <0.60)

## 2014-10-14 MED ORDER — DOXYCYCLINE HYCLATE 100 MG PO TABS
100.0000 mg | ORAL_TABLET | Freq: Two times a day (BID) | ORAL | Status: DC
Start: 2014-10-14 — End: 2014-10-16

## 2014-10-14 MED ORDER — CEPHALEXIN 500 MG PO CAPS: 500.0000 mg | ORAL_CAPSULE | Freq: Four times a day (QID) | ORAL | Status: DC

## 2014-10-14 NOTE — Progress Notes (Signed)
Patient ID: Eric Lewis, male   DOB: 1971/05/21, 43 y.o.   MRN: [id number]         [facility] for Infectious Disease  Patient Active Problem List   Diagnosis Date Noted  . Septic arthritis of knee, right 09/30/2014    Priority: High  . Diabetes mellitus 09/30/2014  . Dyslipidemia 09/30/2014  . Obesity 09/30/2014    Patient's Medications  New Prescriptions   CEPHALEXIN (KEFLEX) 500 MG CAPSULE    Take 1 capsule (500 mg total) by mouth 4 (four) times daily.   DOXYCYCLINE (VIBRA-TABS) 100 MG TABLET    Take 1 tablet (100 mg total) by mouth 2 (two) times daily.  Previous Medications   LISINOPRIL (PRINIVIL,ZESTRIL) 10 MG TABLET    Take 10 mg by mouth daily.   MELOXICAM (MOBIC) 15 MG TABLET    Take 1 tablet by mouth daily.   METFORMIN (GLUCOPHAGE) 500 MG TABLET    Take by mouth 2 (two) times daily with a meal.   OXYCODONE-ACETAMINOPHEN (PERCOCET/ROXICET) 5-325 MG PER TABLET    Take 1-2 tablets by mouth every 6 (six) hours as needed for severe pain.   TERBINAFINE (LAMISIL) 250 MG TABLET    Take 250 mg by mouth daily.  Modified Medications   No medications on file  Discontinued Medications   CIPROFLOXACIN (CIPRO) 500 MG TABLET    Take 500 mg by mouth 2 (two) times daily.    Subjective: Eric Lewis is in for his follow-up visit. He states that he is feeling much better after arthroscopic washout of his right knee and one dose of oritavancin on 10/02/2014. He has had no fever or chills. He has some residual soreness in his right knee but no significant pain. He is not requiring any narcotic pain medication.  Review of Systems: Pertinent items are noted in HPI.  Past Medical History  Diagnosis Date  . Diabetes mellitus without complication   . Obesity     History  Substance Use Topics  . Smoking status: Never Smoker   . Smokeless tobacco: Not on file  . Alcohol Use: No    No family history on file.  No Known Allergies  Objective: Temp: 98.4 F (36.9 C) (12/09  1430) Temp Source: Oral (12/09 1430) BP: 156/98 mmHg (12/09 1430) Pulse Rate: 117 (12/09 1430)  General: He is in good spirits and in no distress Right knee: Arthroscopic incisions are healing well. He has some mild swelling without significant warmth or redness. He has good range of motion without pain.  Right knee synovial fluid Gram stain 09/28/2014: Gram-positive cocci in pairs Right knee synovial fluid culture 09/28/2014: Negative  SED RATE (mm/hr)  Date Value  09/30/2014 51*   CRP (mg/dL)  Date Value  09/30/2014 4.5*      Assessment: Eric Lewis is doing better after arthroscopic washout and empiric antibiotic therapy for presumed strep or staph septic arthritis of his right knee. I will complete therapy with 2 more weeks of oral doxycycline and cephalexin.  Plan: 1. Cephalexin 500 mg 4 times daily for 2 weeks 2. Doxycycline 100 mg twice daily for 2 weeks 3. Follow-up here in 6 weeks   Michel Bickers, MD [facility] for [place] [phone] pager   [phone] cell 10/14/2014, 2:50 PM

## 2014-10-15 LAB — SEDIMENTATION RATE: Sed Rate: 28 mm/hr — ABNORMAL HIGH (ref 0–16)

## 2014-10-16 ENCOUNTER — Other Ambulatory Visit: Payer: Self-pay | Admitting: Licensed Clinical Social Worker

## 2014-10-16 DIAGNOSIS — M009 Pyogenic arthritis, unspecified: Secondary | ICD-10-CM

## 2014-10-16 MED ORDER — DOXYCYCLINE HYCLATE 100 MG PO TABS: 100.0000 mg | ORAL_TABLET | Freq: Two times a day (BID) | ORAL | Status: AC

## 2014-10-16 MED ORDER — CEPHALEXIN 500 MG PO CAPS: 500.0000 mg | ORAL_CAPSULE | Freq: Four times a day (QID) | ORAL | Status: AC

## 2014-10-16 NOTE — Addendum Note (Signed)
Addended by: Reggy Eye on: 10/16/2014 10:05 AM   Modules accepted: Orders

## 2014-10-20 ENCOUNTER — Encounter: Payer: Self-pay | Admitting: Internal Medicine

## 2014-10-27 LAB — FUNGUS CULTURE W SMEAR: Fungal Smear: NONE SEEN

## 2014-11-10 LAB — AFB CULTURE WITH SMEAR (NOT AT ARMC): Acid Fast Smear: NONE SEEN

## 2014-11-26 ENCOUNTER — Ambulatory Visit: Payer: Self-pay | Admitting: Internal Medicine

## 2015-05-03 ENCOUNTER — Other Ambulatory Visit: Payer: Self-pay

## 2016-02-17 DIAGNOSIS — R21 Rash and other nonspecific skin eruption: Secondary | ICD-10-CM | POA: Diagnosis not present

## 2016-02-17 DIAGNOSIS — E7800 Pure hypercholesterolemia, unspecified: Secondary | ICD-10-CM | POA: Diagnosis not present

## 2016-02-17 DIAGNOSIS — E1165 Type 2 diabetes mellitus with hyperglycemia: Secondary | ICD-10-CM | POA: Diagnosis not present

## 2016-03-06 DIAGNOSIS — E119 Type 2 diabetes mellitus without complications: Secondary | ICD-10-CM | POA: Diagnosis not present

## 2016-03-08 DIAGNOSIS — M2550 Pain in unspecified joint: Secondary | ICD-10-CM | POA: Diagnosis not present

## 2016-03-08 DIAGNOSIS — E8881 Metabolic syndrome: Secondary | ICD-10-CM | POA: Diagnosis not present

## 2016-03-08 DIAGNOSIS — M25471 Effusion, right ankle: Secondary | ICD-10-CM | POA: Diagnosis not present

## 2016-03-08 DIAGNOSIS — L409 Psoriasis, unspecified: Secondary | ICD-10-CM | POA: Diagnosis not present

## 2016-03-10 LAB — LAB REPORT - SCANNED: HM Hepatitis Screen: NEGATIVE

## 2016-03-22 DIAGNOSIS — L4050 Arthropathic psoriasis, unspecified: Secondary | ICD-10-CM | POA: Diagnosis not present

## 2016-03-22 DIAGNOSIS — M2550 Pain in unspecified joint: Secondary | ICD-10-CM | POA: Diagnosis not present

## 2016-03-22 DIAGNOSIS — M25471 Effusion, right ankle: Secondary | ICD-10-CM | POA: Diagnosis not present

## 2016-03-22 DIAGNOSIS — E8881 Metabolic syndrome: Secondary | ICD-10-CM | POA: Diagnosis not present

## 2016-05-24 DIAGNOSIS — E8881 Metabolic syndrome: Secondary | ICD-10-CM | POA: Diagnosis not present

## 2016-05-24 DIAGNOSIS — M25471 Effusion, right ankle: Secondary | ICD-10-CM | POA: Diagnosis not present

## 2016-05-24 DIAGNOSIS — M2550 Pain in unspecified joint: Secondary | ICD-10-CM | POA: Diagnosis not present

## 2016-05-24 DIAGNOSIS — L4050 Arthropathic psoriasis, unspecified: Secondary | ICD-10-CM | POA: Diagnosis not present

## 2016-05-29 DIAGNOSIS — J069 Acute upper respiratory infection, unspecified: Secondary | ICD-10-CM | POA: Diagnosis not present

## 2016-05-29 DIAGNOSIS — R509 Fever, unspecified: Secondary | ICD-10-CM | POA: Diagnosis not present

## 2016-05-29 DIAGNOSIS — R05 Cough: Secondary | ICD-10-CM | POA: Diagnosis not present

## 2016-05-30 ENCOUNTER — Emergency Department (HOSPITAL_COMMUNITY)
Admission: EM | Admit: 2016-05-30 | Discharge: 2016-05-30 | Disposition: A | Discharge status: Home / Self Care | Payer: BLUE CROSS/BLUE SHIELD | Attending: Emergency Medicine | Admitting: Emergency Medicine

## 2016-05-30 ENCOUNTER — Encounter (HOSPITAL_COMMUNITY): Payer: Self-pay | Admitting: Vascular Surgery

## 2016-05-30 ENCOUNTER — Emergency Department (HOSPITAL_COMMUNITY): Payer: BLUE CROSS/BLUE SHIELD

## 2016-05-30 DIAGNOSIS — R112 Nausea with vomiting, unspecified: Secondary | ICD-10-CM | POA: Diagnosis not present

## 2016-05-30 DIAGNOSIS — R509 Fever, unspecified: Secondary | ICD-10-CM | POA: Diagnosis not present

## 2016-05-30 DIAGNOSIS — R000 Tachycardia, unspecified: Secondary | ICD-10-CM | POA: Diagnosis not present

## 2016-05-30 DIAGNOSIS — Z7984 Long term (current) use of oral hypoglycemic drugs: Secondary | ICD-10-CM | POA: Insufficient documentation

## 2016-05-30 DIAGNOSIS — R05 Cough: Secondary | ICD-10-CM | POA: Diagnosis not present

## 2016-05-30 DIAGNOSIS — R1110 Vomiting, unspecified: Secondary | ICD-10-CM | POA: Diagnosis present

## 2016-05-30 DIAGNOSIS — E1165 Type 2 diabetes mellitus with hyperglycemia: Secondary | ICD-10-CM | POA: Insufficient documentation

## 2016-05-30 HISTORY — DX: Pure hypercholesterolemia, unspecified: E78.00

## 2016-05-30 HISTORY — DX: Arthropathic psoriasis, unspecified: L40.50

## 2016-05-30 LAB — URINALYSIS, ROUTINE W REFLEX MICROSCOPIC
BILIRUBIN URINE: NEGATIVE
Glucose, UA: NEGATIVE mg/dL
Hgb urine dipstick: NEGATIVE
Ketones, ur: 15 mg/dL — AB
LEUKOCYTES UA: NEGATIVE
NITRITE: NEGATIVE
PH: 5.5 (ref 5.0–8.0)
PROTEIN: NEGATIVE mg/dL
Specific Gravity, Urine: 1.016 (ref 1.005–1.030)

## 2016-05-30 LAB — COMPREHENSIVE METABOLIC PANEL
ALT: 45 U/L (ref 17–63)
AST: 19 U/L (ref 15–41)
Albumin: 3.6 g/dL (ref 3.5–5.0)
Alkaline Phosphatase: 104 U/L (ref 38–126)
Anion gap: 11 (ref 5–15)
BILIRUBIN TOTAL: 1.6 mg/dL — AB (ref 0.3–1.2)
BUN: 6 mg/dL (ref 6–20)
CALCIUM: 9.1 mg/dL (ref 8.9–10.3)
CO2: 25 mmol/L (ref 22–32)
Chloride: 99 mmol/L — ABNORMAL LOW (ref 101–111)
Creatinine, Ser: 1.03 mg/dL (ref 0.61–1.24)
GFR calc Af Amer: >60 mL/min (ref >60)
GFR calc non Af Amer: >60 mL/min (ref >60)
Glucose, Bld: 193 mg/dL — ABNORMAL HIGH (ref 65–99)
POTASSIUM: 4 mmol/L (ref 3.5–5.1)
Sodium: 135 mmol/L (ref 135–145)
TOTAL PROTEIN: 7.4 g/dL (ref 6.5–8.1)

## 2016-05-30 LAB — CBC
HCT: 44.6 % (ref 39.0–52.0)
Hemoglobin: 14.5 g/dL (ref 13.0–17.0)
MCH: 28.1 pg (ref 26.0–34.0)
MCHC: 32.5 g/dL (ref 30.0–36.0)
MCV: 86.4 fL (ref 78.0–100.0)
Platelets: 331 10*3/uL (ref 150–400)
RBC: 5.16 MIL/uL (ref 4.22–5.81)
RDW: 13.1 % (ref 11.5–15.5)
WBC: 15.3 10*3/uL — ABNORMAL HIGH (ref 4.0–10.5)

## 2016-05-30 LAB — LIPASE, BLOOD: Lipase: 15 U/L (ref 11–51)

## 2016-05-30 IMAGING — DX DG CHEST 2V
2 series · 2 of 2 positions shown · non-contrast
Comparison: None.

CLINICAL DATA: Cough and fever

EXAM:
CHEST  2 VIEW

[chest pa]
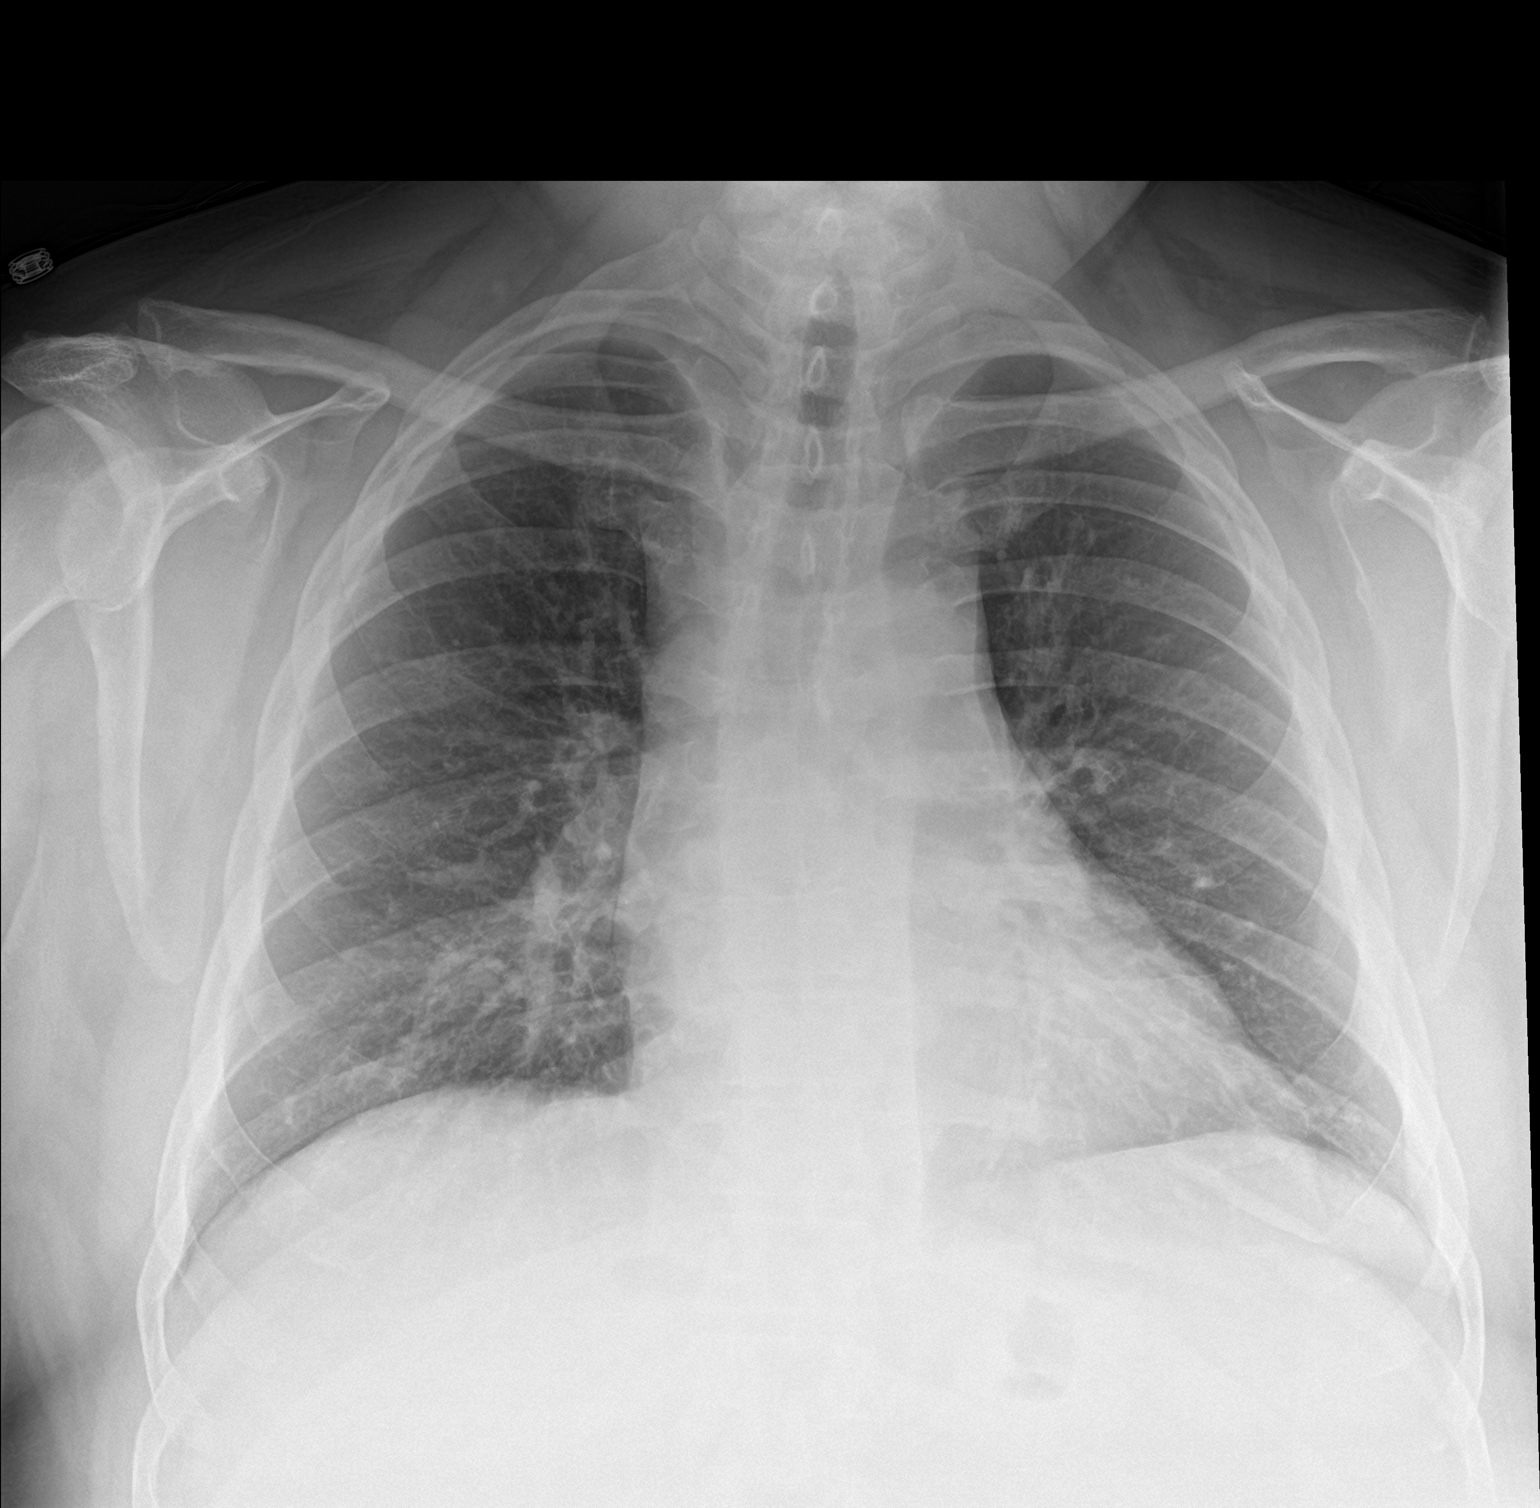

[chest lat]
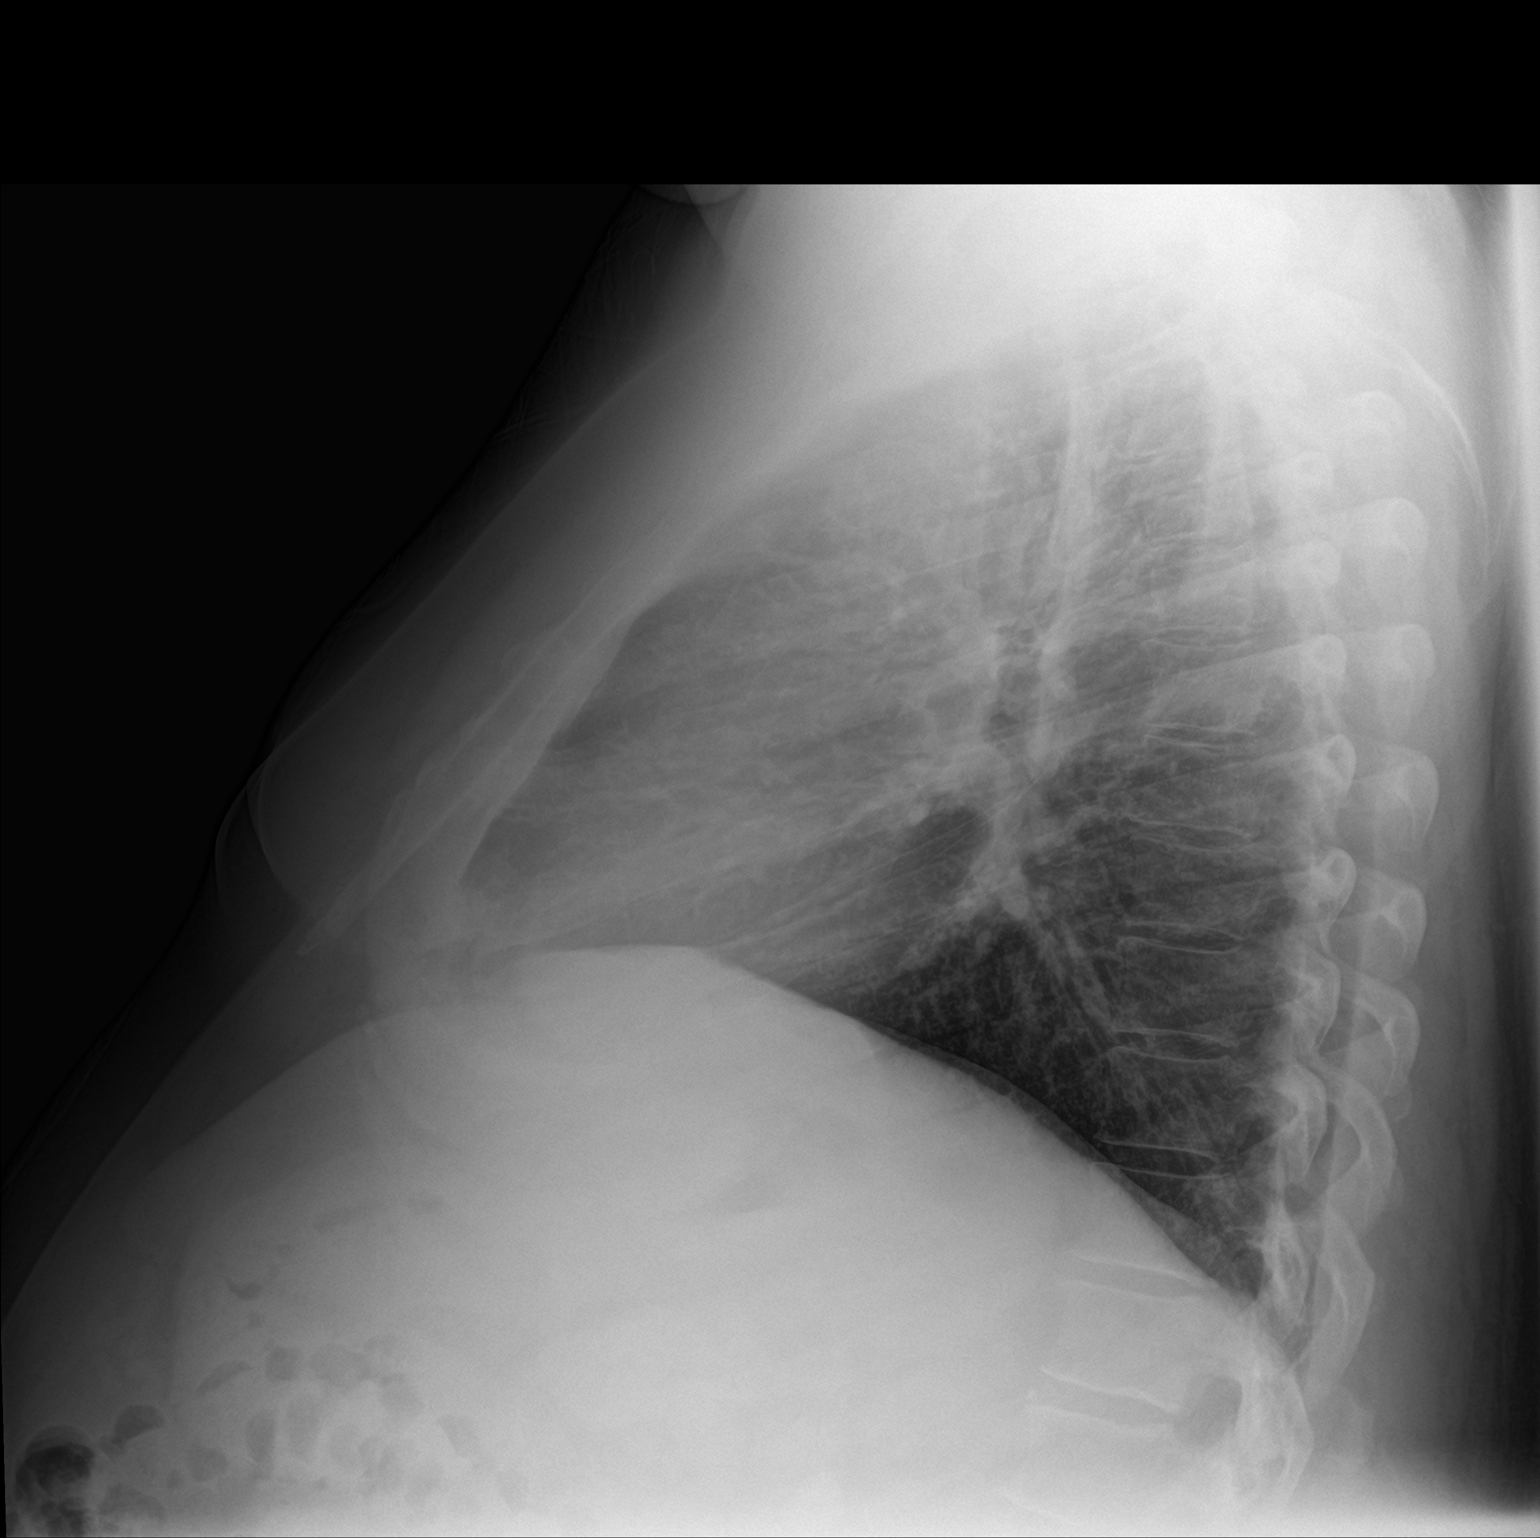

[2 of 2 positions shown; findings below may reference images not displayed]

FINDINGS: There is no edema or consolidation. The heart size and pulmonary
vascularity are normal. No adenopathy. No bone lesions.
IMPRESSION: No edema or consolidation.

## 2016-05-30 MED ORDER — ACETAMINOPHEN 325 MG PO TABS
650.0000 mg | ORAL_TABLET | Freq: Once | ORAL | Status: AC
  Administered 2016-05-30: 650 mg via ORAL

## 2016-05-30 MED ORDER — ONDANSETRON HCL 4 MG PO TABS: 4.0000 mg | ORAL_TABLET | Freq: Four times a day (QID) | ORAL | 0 refills | Status: DC

## 2016-05-30 MED ORDER — SODIUM CHLORIDE 0.9 % IV BOLUS (SEPSIS)
1000.0000 mL | Freq: Once | INTRAVENOUS | Status: AC
  Administered 2016-05-30: 1000 mL via INTRAVENOUS

## 2016-05-30 MED ORDER — ACETAMINOPHEN 325 MG PO TABS
ORAL_TABLET | ORAL | Status: AC
  Filled 2016-05-30: qty 2

## 2016-05-30 NOTE — ED Notes (Signed)
Pt returned from xray

## 2016-05-30 NOTE — ED Triage Notes (Signed)
Pt reports to the ED for eval of fevers and  emesis. He reports he was dx with laryngitis and he has been feeling worse ever since. He also has a continued cough. Today he had one episode of water character emesis. Pt A&Ox4, resp e/u, and skin warm and dry. Has not had any Tylenol today.

## 2016-05-30 NOTE — ED Provider Notes (Signed)
[facility] DEPT Provider Note   CSN: [id number] Arrival date & time: 05/30/16  1007  First Provider Contact:  First MD Initiated Contact with Patient 05/30/16 1116        History   Chief Complaint Chief Complaint  Patient presents with  . Fever  . Emesis    HPI Eric Lewis is a 45 y.o. male.  He presents today with cough and emesis.  He went to urgent care yesterday who put him on hydrocodone syrup for the cough and an abx to take if sx did not improve by tomorrow.  Pt developed a fever today.  Pt said that he was at [facility] getting tylenol when he threw up.  He no longer feels nauseous.  Pt said cough has improved.  He denies any other pain.  The history is provided by the patient.  Fever   Associated symptoms include vomiting and cough.  Emesis   Associated symptoms include cough and a fever.    Past Medical History:  Diagnosis Date  . Diabetes mellitus without complication ([place])   . Hypercholesteremia   . Obesity   . Psoriatic arthritis [facility])     Patient Active Problem List   Diagnosis Date Noted  . Septic arthritis of knee, right ([place]) 09/30/2014  . Diabetes mellitus ([place]) 09/30/2014  . Dyslipidemia 09/30/2014  . Obesity 09/30/2014    Past Surgical History:  Procedure Laterality Date  . CHONDROPLASTY Right 09/28/2014   Procedure: CHONDROPLASTY, PATELLA/FEMORAL;  Surgeon: Alta Corning, MD;  Location: [place];  Service: Orthopedics;  Laterality: Right;  . KNEE ARTHROSCOPY WITH MEDIAL MENISECTOMY Right 09/28/2014   Procedure: KNEE ARTHROSCOPY WITH MEDIAL MENISECTOMY;  Surgeon: Alta Corning, MD;  Location: [place];  Service: Orthopedics;  Laterality: Right;  . SYNOVECTOMY Right 09/28/2014   Procedure: SYNOVECTOMY, THREE COMPARTMENT;  Surgeon: Alta Corning, MD;  Location: [place];  Service: Orthopedics;  Laterality: Right;       Home Medications    Prior to Admission medications   Medication Sig  Start Date End Date Taking? Authorizing Provider  lisinopril (PRINIVIL,ZESTRIL) 10 MG tablet Take 10 mg by mouth daily.    Historical Provider, MD  meloxicam (MOBIC) 15 MG tablet Take 1 tablet by mouth daily. 09/27/14   Historical Provider, MD  metFORMIN (GLUCOPHAGE) 500 MG tablet Take by mouth 2 (two) times daily with a meal.    Historical Provider, MD  ondansetron (ZOFRAN) 4 MG tablet Take 1 tablet (4 mg total) by mouth every 6 (six) hours. 05/30/16   Isla Pence, MD  oxyCODONE-acetaminophen (PERCOCET/ROXICET) 5-325 MG per tablet Take 1-2 tablets by mouth every 6 (six) hours as needed for severe pain. 09/28/14   Gary Fleet, PA-C  terbinafine (LAMISIL) 250 MG tablet Take 250 mg by mouth daily.    Historical Provider, MD    Family History No family history on file.  Social History Social History  Substance Use Topics  . Smoking status: Never Smoker  . Smokeless tobacco: Never Used  . Alcohol use No     Allergies   Review of patient's allergies indicates no known allergies.   Review of Systems Review of Systems  Constitutional: Positive for fever.  Respiratory: Positive for cough.   Gastrointestinal: Positive for vomiting.  All other systems reviewed and are negative.    Physical Exam Updated Vital Signs BP 134/88 (BP Location: Right Arm)   Pulse 97   Temp 99 F (37.2 C) (Oral)   Resp  18   Wt 276 lb 5 oz (125.3 kg)   SpO2 94%   BMI 42.01 kg/m   Physical Exam  Constitutional: He is oriented to person, place, and time. He appears well-developed and well-nourished.  HENT:  Head: Normocephalic and atraumatic.  Right Ear: External ear normal.  Left Ear: External ear normal.  Nose: Nose normal.  Mouth/Throat: Oropharynx is clear and moist.  Eyes: Conjunctivae and EOM are normal. Pupils are equal, round, and reactive to light.  Neck: Normal range of motion. Neck supple.  Cardiovascular: Regular rhythm, normal heart sounds and intact distal pulses.  Tachycardia  present.   Pulmonary/Chest: Effort normal and breath sounds normal.  Abdominal: Soft. Bowel sounds are normal.  Musculoskeletal: Normal range of motion.  Neurological: He is alert and oriented to person, place, and time.  Skin: Skin is warm and dry.  Psychiatric: He has a normal mood and affect. His behavior is normal. Judgment and thought content normal.  Nursing note and vitals reviewed.    ED Treatments / Results  Labs (all labs ordered are listed, but only abnormal results are displayed) Labs Reviewed  COMPREHENSIVE METABOLIC PANEL - Abnormal; Notable for the following:       Result Value   Chloride 99 (*)    Glucose, Bld 193 (*)    Total Bilirubin 1.6 (*)    All other components within normal limits  CBC - Abnormal; Notable for the following:    WBC 15.3 (*)    All other components within normal limits  URINALYSIS, ROUTINE W REFLEX MICROSCOPIC (NOT AT [facility]) - Abnormal; Notable for the following:    Color, Urine AMBER (*)    Ketones, ur 15 (*)    All other components within normal limits  LIPASE, BLOOD    EKG  EKG Interpretation None       Radiology Dg Chest 2 View  Result Date: 05/30/2016 CLINICAL DATA:  Cough and fever EXAM: CHEST  2 VIEW COMPARISON:  None. FINDINGS: There is no edema or consolidation. The heart size and pulmonary vascularity are normal. No adenopathy. No bone lesions. IMPRESSION: No edema or consolidation. Electronically Signed   By: Lowella Grip III M.D.   On: 05/30/2016 12:16   Procedures Procedures (including critical care time)  Medications Ordered in ED Medications  acetaminophen (TYLENOL) 325 MG tablet (not administered)  acetaminophen (TYLENOL) tablet 650 mg (650 mg Oral Given 05/30/16 1021)  sodium chloride 0.9 % bolus 1,000 mL (1,000 mLs Intravenous New Bag/Given 05/30/16 1145)     Initial Impression / Assessment and Plan / ED Course  I have reviewed the triage vital signs and the nursing notes.  Pertinent labs & imaging  results that were available during my care of the patient were reviewed by me and considered in my medical decision making (see chart for details).  Clinical Course    Pt is feeling better.  He is able to tolerate po fluids.  Pt will be discharged home and instr to return if worse and to f/u with pcp.  Final Clinical Impressions(s) / ED Diagnoses   Final diagnoses:  Non-intractable vomiting with nausea, vomiting of unspecified type  Fever, unspecified fever cause  Type 2 diabetes mellitus with hyperglycemia, without long-term current use of insulin (HCC)    New Prescriptions New Prescriptions   ONDANSETRON (ZOFRAN) 4 MG TABLET    Take 1 tablet (4 mg total) by mouth every 6 (six) hours.     Isla Pence, MD 05/30/16 [phone]

## 2016-06-14 DIAGNOSIS — L4050 Arthropathic psoriasis, unspecified: Secondary | ICD-10-CM | POA: Diagnosis not present

## 2016-06-14 DIAGNOSIS — E1165 Type 2 diabetes mellitus with hyperglycemia: Secondary | ICD-10-CM | POA: Diagnosis not present

## 2016-06-14 DIAGNOSIS — Z79899 Other long term (current) drug therapy: Secondary | ICD-10-CM | POA: Diagnosis not present

## 2016-06-19 DIAGNOSIS — L4050 Arthropathic psoriasis, unspecified: Secondary | ICD-10-CM | POA: Diagnosis not present

## 2016-06-19 DIAGNOSIS — K429 Umbilical hernia without obstruction or gangrene: Secondary | ICD-10-CM | POA: Diagnosis not present

## 2016-06-19 DIAGNOSIS — R0683 Snoring: Secondary | ICD-10-CM | POA: Diagnosis not present

## 2016-06-19 DIAGNOSIS — E1165 Type 2 diabetes mellitus with hyperglycemia: Secondary | ICD-10-CM | POA: Diagnosis not present

## 2016-08-24 DIAGNOSIS — L4050 Arthropathic psoriasis, unspecified: Secondary | ICD-10-CM | POA: Diagnosis not present

## 2016-08-24 DIAGNOSIS — E8881 Metabolic syndrome: Secondary | ICD-10-CM | POA: Diagnosis not present

## 2016-08-24 DIAGNOSIS — M2550 Pain in unspecified joint: Secondary | ICD-10-CM | POA: Diagnosis not present

## 2016-08-24 DIAGNOSIS — M25471 Effusion, right ankle: Secondary | ICD-10-CM | POA: Diagnosis not present

## 2016-09-15 DIAGNOSIS — E1165 Type 2 diabetes mellitus with hyperglycemia: Secondary | ICD-10-CM | POA: Diagnosis not present

## 2016-09-19 DIAGNOSIS — E119 Type 2 diabetes mellitus without complications: Secondary | ICD-10-CM | POA: Diagnosis not present

## 2016-12-25 DIAGNOSIS — M2550 Pain in unspecified joint: Secondary | ICD-10-CM | POA: Diagnosis not present

## 2016-12-25 DIAGNOSIS — L409 Psoriasis, unspecified: Secondary | ICD-10-CM | POA: Diagnosis not present

## 2016-12-25 DIAGNOSIS — M25471 Effusion, right ankle: Secondary | ICD-10-CM | POA: Diagnosis not present

## 2016-12-25 DIAGNOSIS — L4050 Arthropathic psoriasis, unspecified: Secondary | ICD-10-CM | POA: Diagnosis not present

## 2017-01-17 DIAGNOSIS — E785 Hyperlipidemia, unspecified: Secondary | ICD-10-CM | POA: Diagnosis not present

## 2017-01-17 DIAGNOSIS — E119 Type 2 diabetes mellitus without complications: Secondary | ICD-10-CM | POA: Diagnosis not present

## 2017-01-17 DIAGNOSIS — R0683 Snoring: Secondary | ICD-10-CM | POA: Diagnosis not present

## 2017-04-23 DIAGNOSIS — M25471 Effusion, right ankle: Secondary | ICD-10-CM | POA: Diagnosis not present

## 2017-04-23 DIAGNOSIS — L4050 Arthropathic psoriasis, unspecified: Secondary | ICD-10-CM | POA: Diagnosis not present

## 2017-04-23 DIAGNOSIS — M2550 Pain in unspecified joint: Secondary | ICD-10-CM | POA: Diagnosis not present

## 2017-04-23 DIAGNOSIS — L409 Psoriasis, unspecified: Secondary | ICD-10-CM | POA: Diagnosis not present

## 2017-06-21 DIAGNOSIS — E1165 Type 2 diabetes mellitus with hyperglycemia: Secondary | ICD-10-CM | POA: Diagnosis not present

## 2017-06-21 DIAGNOSIS — E7800 Pure hypercholesterolemia, unspecified: Secondary | ICD-10-CM | POA: Diagnosis not present

## 2017-06-21 DIAGNOSIS — L4050 Arthropathic psoriasis, unspecified: Secondary | ICD-10-CM | POA: Diagnosis not present

## 2017-08-29 DIAGNOSIS — L4050 Arthropathic psoriasis, unspecified: Secondary | ICD-10-CM | POA: Diagnosis not present

## 2017-08-29 DIAGNOSIS — Z79899 Other long term (current) drug therapy: Secondary | ICD-10-CM | POA: Diagnosis not present

## 2017-08-29 DIAGNOSIS — L409 Psoriasis, unspecified: Secondary | ICD-10-CM | POA: Diagnosis not present

## 2017-10-22 DIAGNOSIS — E1165 Type 2 diabetes mellitus with hyperglycemia: Secondary | ICD-10-CM | POA: Diagnosis not present

## 2017-10-22 DIAGNOSIS — E7800 Pure hypercholesterolemia, unspecified: Secondary | ICD-10-CM | POA: Diagnosis not present

## 2017-10-22 DIAGNOSIS — Z7984 Long term (current) use of oral hypoglycemic drugs: Secondary | ICD-10-CM | POA: Diagnosis not present

## 2018-01-28 DIAGNOSIS — L4050 Arthropathic psoriasis, unspecified: Secondary | ICD-10-CM | POA: Diagnosis not present

## 2018-01-28 DIAGNOSIS — L409 Psoriasis, unspecified: Secondary | ICD-10-CM | POA: Diagnosis not present

## 2018-01-28 DIAGNOSIS — M25471 Effusion, right ankle: Secondary | ICD-10-CM | POA: Diagnosis not present

## 2018-01-28 DIAGNOSIS — Z79899 Other long term (current) drug therapy: Secondary | ICD-10-CM | POA: Diagnosis not present

## 2018-02-20 DIAGNOSIS — L4050 Arthropathic psoriasis, unspecified: Secondary | ICD-10-CM | POA: Diagnosis not present

## 2018-02-20 DIAGNOSIS — E7800 Pure hypercholesterolemia, unspecified: Secondary | ICD-10-CM | POA: Diagnosis not present

## 2018-02-20 DIAGNOSIS — E1165 Type 2 diabetes mellitus with hyperglycemia: Secondary | ICD-10-CM | POA: Diagnosis not present

## 2018-02-20 DIAGNOSIS — E119 Type 2 diabetes mellitus without complications: Secondary | ICD-10-CM | POA: Diagnosis not present

## 2018-06-17 DIAGNOSIS — E119 Type 2 diabetes mellitus without complications: Secondary | ICD-10-CM | POA: Diagnosis not present

## 2018-07-31 DIAGNOSIS — L4050 Arthropathic psoriasis, unspecified: Secondary | ICD-10-CM | POA: Diagnosis not present

## 2018-07-31 DIAGNOSIS — Z79899 Other long term (current) drug therapy: Secondary | ICD-10-CM | POA: Diagnosis not present

## 2018-07-31 DIAGNOSIS — L409 Psoriasis, unspecified: Secondary | ICD-10-CM | POA: Diagnosis not present

## 2018-10-14 DIAGNOSIS — E785 Hyperlipidemia, unspecified: Secondary | ICD-10-CM | POA: Diagnosis not present

## 2018-10-14 DIAGNOSIS — E1165 Type 2 diabetes mellitus with hyperglycemia: Secondary | ICD-10-CM | POA: Diagnosis not present

## 2018-10-17 DIAGNOSIS — L509 Urticaria, unspecified: Secondary | ICD-10-CM | POA: Diagnosis not present

## 2019-01-29 DIAGNOSIS — L409 Psoriasis, unspecified: Secondary | ICD-10-CM | POA: Diagnosis not present

## 2019-01-29 DIAGNOSIS — L4050 Arthropathic psoriasis, unspecified: Secondary | ICD-10-CM | POA: Diagnosis not present

## 2019-01-29 DIAGNOSIS — Z79899 Other long term (current) drug therapy: Secondary | ICD-10-CM | POA: Diagnosis not present

## 2019-02-17 DIAGNOSIS — E1165 Type 2 diabetes mellitus with hyperglycemia: Secondary | ICD-10-CM | POA: Diagnosis not present

## 2019-02-17 DIAGNOSIS — Z79899 Other long term (current) drug therapy: Secondary | ICD-10-CM | POA: Diagnosis not present

## 2019-02-19 DIAGNOSIS — L4050 Arthropathic psoriasis, unspecified: Secondary | ICD-10-CM | POA: Diagnosis not present

## 2019-02-19 DIAGNOSIS — E1165 Type 2 diabetes mellitus with hyperglycemia: Secondary | ICD-10-CM | POA: Diagnosis not present

## 2019-03-14 DIAGNOSIS — Z79899 Other long term (current) drug therapy: Secondary | ICD-10-CM | POA: Diagnosis not present

## 2019-06-24 DIAGNOSIS — E7800 Pure hypercholesterolemia, unspecified: Secondary | ICD-10-CM | POA: Diagnosis not present

## 2019-06-24 DIAGNOSIS — E1165 Type 2 diabetes mellitus with hyperglycemia: Secondary | ICD-10-CM | POA: Diagnosis not present

## 2019-08-04 DIAGNOSIS — Z6841 Body Mass Index (BMI) 40.0 and over, adult: Secondary | ICD-10-CM | POA: Diagnosis not present

## 2019-08-04 DIAGNOSIS — L409 Psoriasis, unspecified: Secondary | ICD-10-CM | POA: Diagnosis not present

## 2019-08-04 DIAGNOSIS — L4050 Arthropathic psoriasis, unspecified: Secondary | ICD-10-CM | POA: Diagnosis not present

## 2019-08-04 DIAGNOSIS — Z79899 Other long term (current) drug therapy: Secondary | ICD-10-CM | POA: Diagnosis not present

## 2019-10-29 DIAGNOSIS — R030 Elevated blood-pressure reading, without diagnosis of hypertension: Secondary | ICD-10-CM | POA: Diagnosis not present

## 2019-10-29 DIAGNOSIS — E1165 Type 2 diabetes mellitus with hyperglycemia: Secondary | ICD-10-CM | POA: Diagnosis not present

## 2019-12-30 DIAGNOSIS — E1165 Type 2 diabetes mellitus with hyperglycemia: Secondary | ICD-10-CM | POA: Diagnosis not present

## 2020-03-31 DIAGNOSIS — E1165 Type 2 diabetes mellitus with hyperglycemia: Secondary | ICD-10-CM | POA: Diagnosis not present

## 2020-03-31 DIAGNOSIS — E7800 Pure hypercholesterolemia, unspecified: Secondary | ICD-10-CM | POA: Diagnosis not present

## 2020-03-31 DIAGNOSIS — I10 Essential (primary) hypertension: Secondary | ICD-10-CM | POA: Diagnosis not present

## 2020-08-03 DIAGNOSIS — L409 Psoriasis, unspecified: Secondary | ICD-10-CM | POA: Diagnosis not present

## 2020-08-03 DIAGNOSIS — Z79899 Other long term (current) drug therapy: Secondary | ICD-10-CM | POA: Diagnosis not present

## 2020-08-03 DIAGNOSIS — L4050 Arthropathic psoriasis, unspecified: Secondary | ICD-10-CM | POA: Diagnosis not present

## 2020-10-12 DIAGNOSIS — E1165 Type 2 diabetes mellitus with hyperglycemia: Secondary | ICD-10-CM | POA: Diagnosis not present

## 2020-10-12 DIAGNOSIS — E7800 Pure hypercholesterolemia, unspecified: Secondary | ICD-10-CM | POA: Diagnosis not present

## 2020-10-12 DIAGNOSIS — E119 Type 2 diabetes mellitus without complications: Secondary | ICD-10-CM | POA: Diagnosis not present

## 2020-10-12 DIAGNOSIS — I10 Essential (primary) hypertension: Secondary | ICD-10-CM | POA: Diagnosis not present

## 2020-10-12 DIAGNOSIS — Z0000 Encounter for general adult medical examination without abnormal findings: Secondary | ICD-10-CM | POA: Diagnosis not present

## 2020-10-13 ENCOUNTER — Other Ambulatory Visit: Payer: Self-pay | Admitting: Family Medicine

## 2020-10-13 DIAGNOSIS — R1319 Other dysphagia: Secondary | ICD-10-CM

## 2020-10-22 ENCOUNTER — Ambulatory Visit
Admission: RE | Admit: 2020-10-22 | Discharge: 2020-10-22 | Disposition: A | Discharge status: Home / Self Care | Payer: BC Managed Care – PPO | Source: Ambulatory Visit | Attending: Family Medicine | Admitting: Family Medicine

## 2020-10-22 DIAGNOSIS — R1319 Other dysphagia: Secondary | ICD-10-CM

## 2020-10-22 DIAGNOSIS — K225 Diverticulum of esophagus, acquired: Secondary | ICD-10-CM | POA: Diagnosis not present

## 2020-10-22 IMAGING — RF DG ESOPHAGUS
11 series · 14 of 24 positions shown · non-contrast
Comparison: None

CLINICAL DATA: Esophageal dysphagia.

EXAM:
ESOPHOGRAM / BARIUM SWALLOW / BARIUM TABLET STUDY
TECHNIQUE: Combined double contrast and single contrast examination performed
using effervescent crystals, thick barium liquid, and thin barium
liquid. The patient was observed with fluoroscopy swallowing a 13 mm
barium sulphate tablet.
FLUOROSCOPY TIME:  Fluoroscopy Time:  2 minutes and 36 seconds
Radiation Exposure Index (if provided by the fluoroscopic device):
610 mGy
Number of Acquired Spot Images: 8

[Series 1: sequence · 0.31mm/px · 1 of 13 frames shown (1 of 10)]
[frame 2/13]
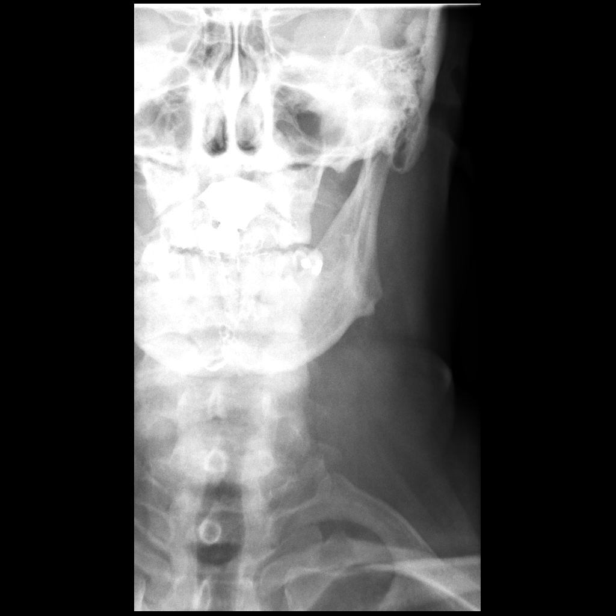

[Series 2: sequence · 0.31mm/px · 2 of 12 frames shown (2 of 10)]
[frame 2/12]
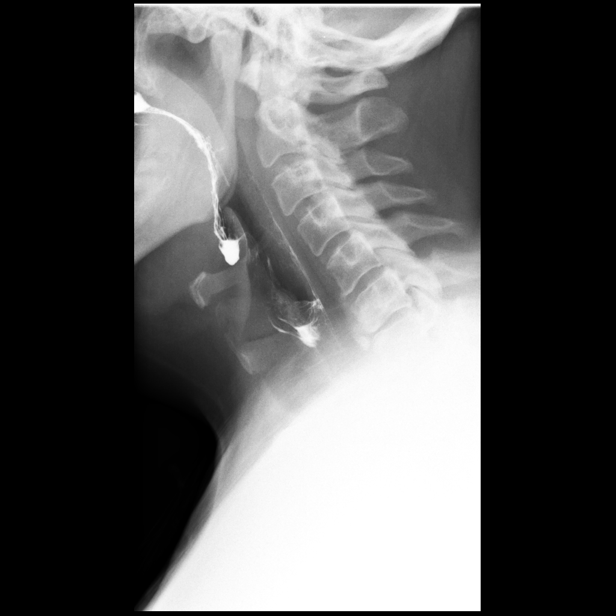
[frame 11/12]
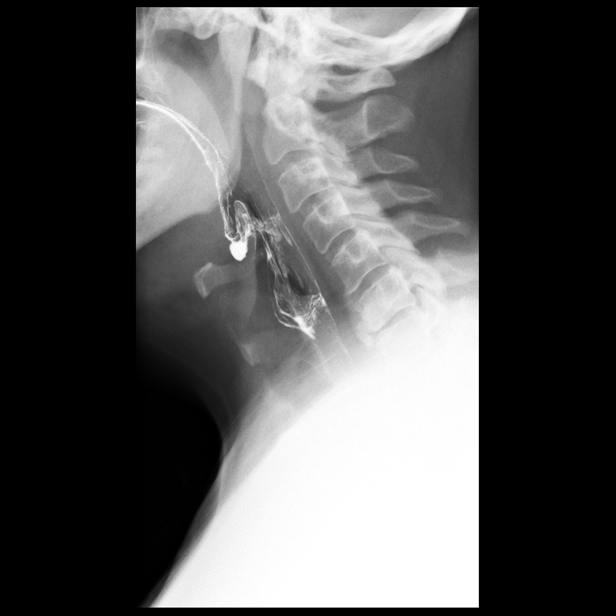

[Series 3: sequence · 0.28mm/px · 1 of 15 frames shown (3 of 10)]
[frame 13/15]
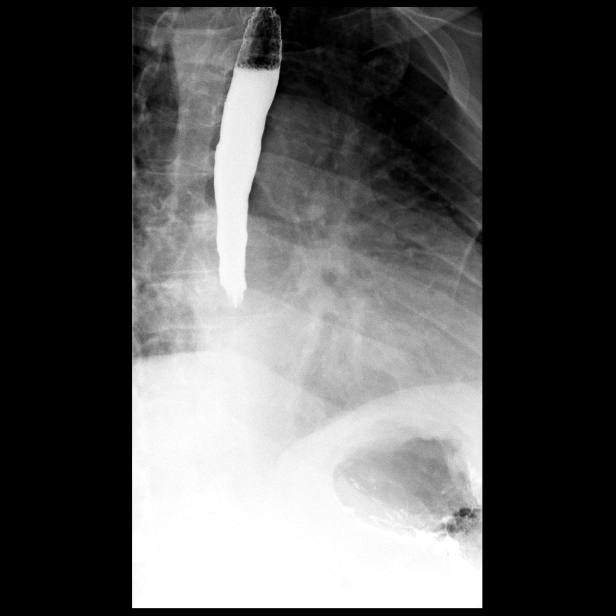

[Series 4: sequence · 0.28mm/px · 1 of 9 frames shown (4 of 10)]
[frame 2/9]
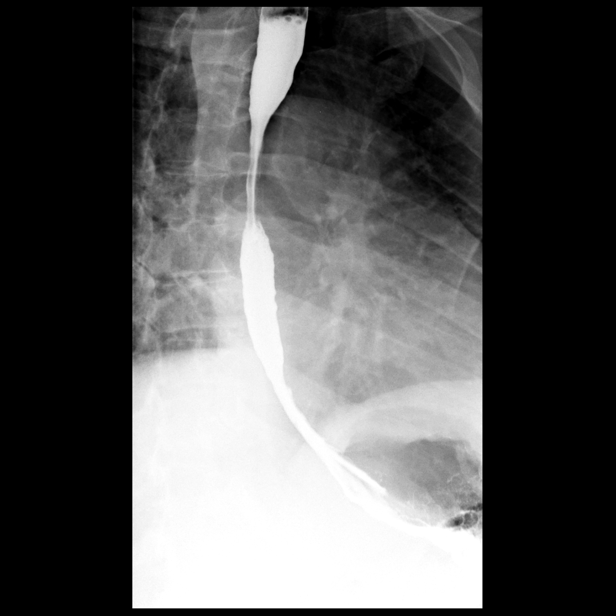

[Series 5: one shot · 1 of 2 slices shown]
[im 1/2]
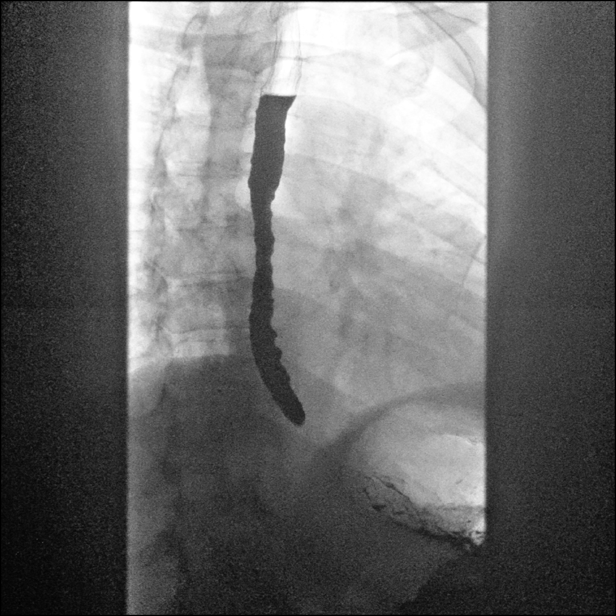

[Series 6: sequence · 2 of 14 frames shown (5 of 10)]
[frame 12/14]
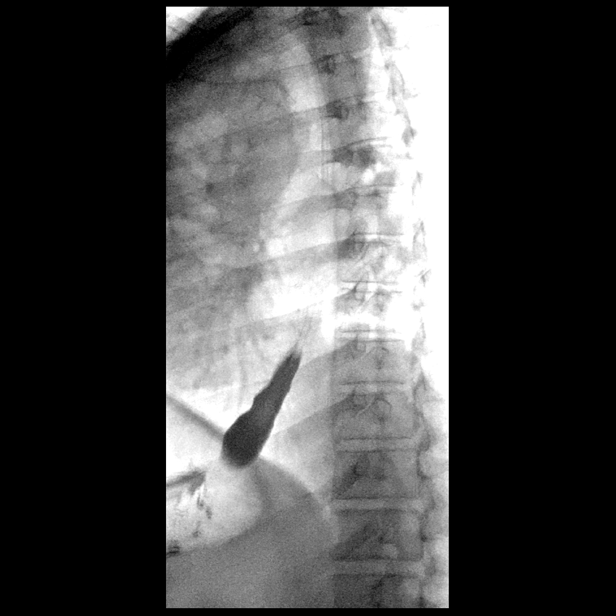
[frame 14/14]
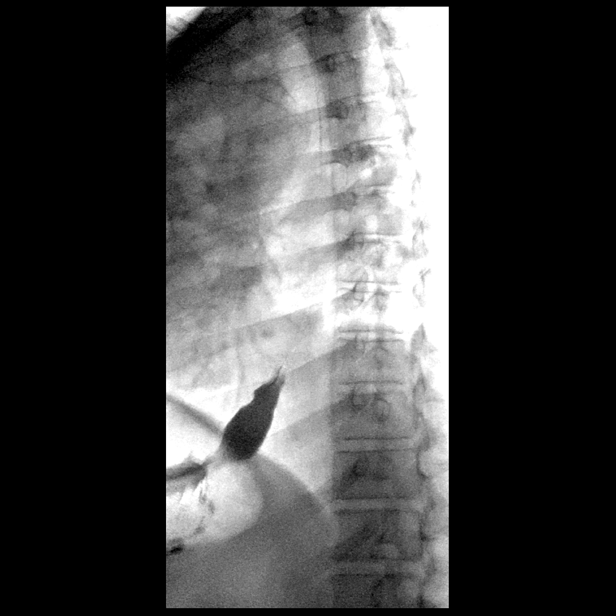

[Series 7: sequence · 1 of 12 frames shown (6 of 10)]
[frame 11/12]
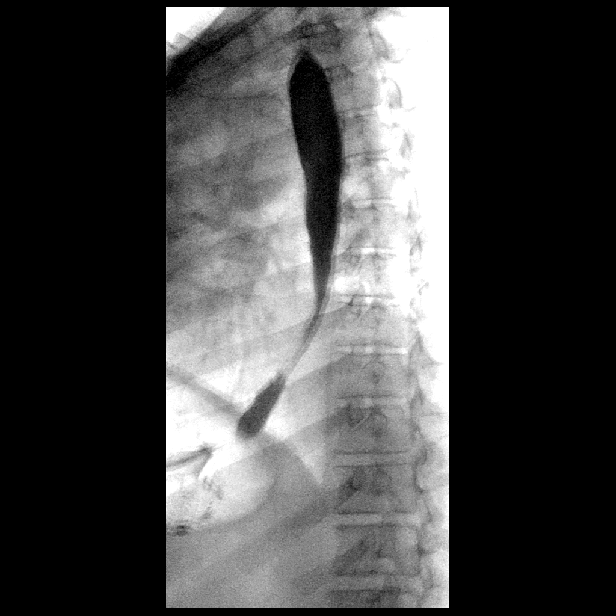

[Series 8: sequence · 1 of 9 frames shown (7 of 10)]
[frame 9/9]
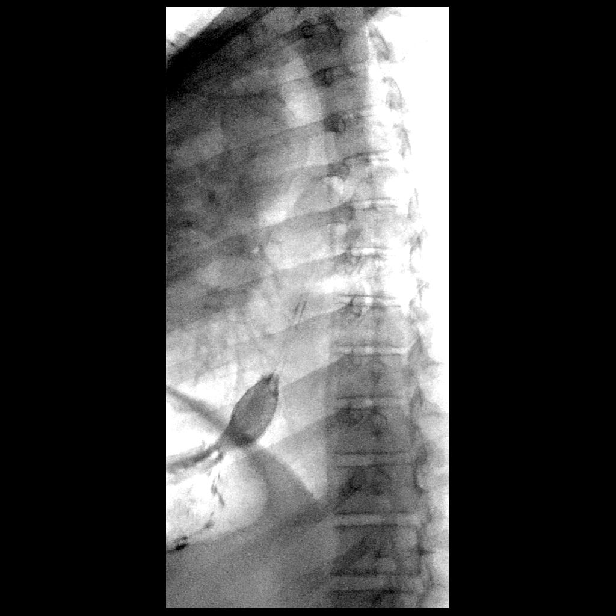

[Series 9: sequence · 1 of 20 frames shown (8 of 10)]
[frame 18/20]
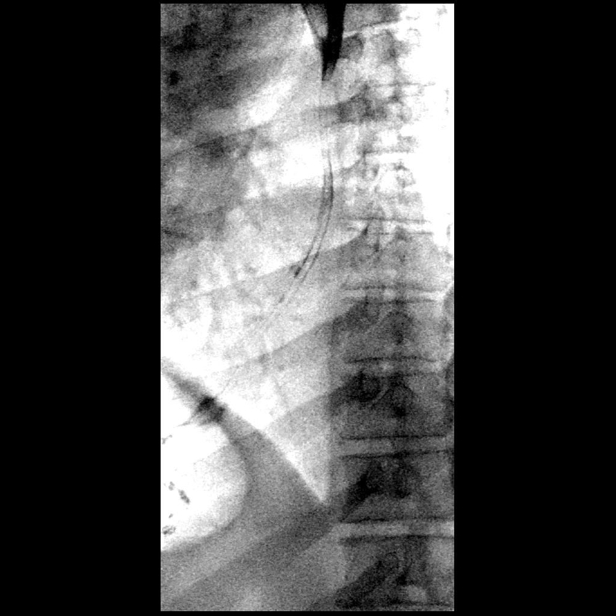

[Series 11: sequence · 2 of 20 frames shown (9 of 10)]
[frame 4/20]
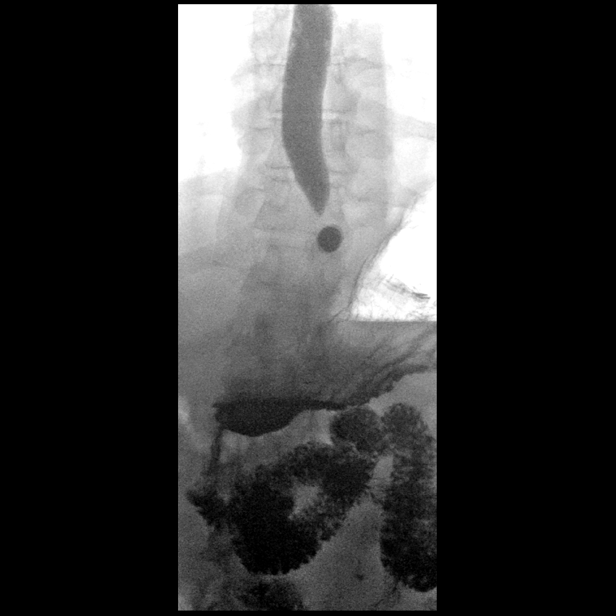
[frame 18/20]
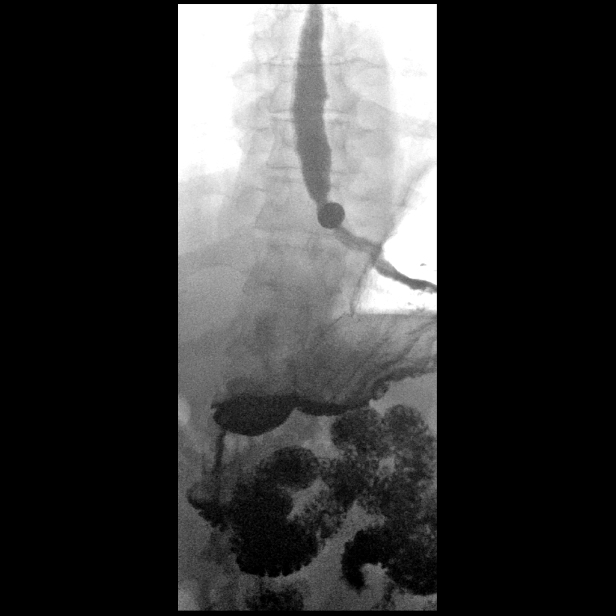

[Series 12: sequence · 1 of 17 frames shown (10 of 10)]
[frame 15/17]
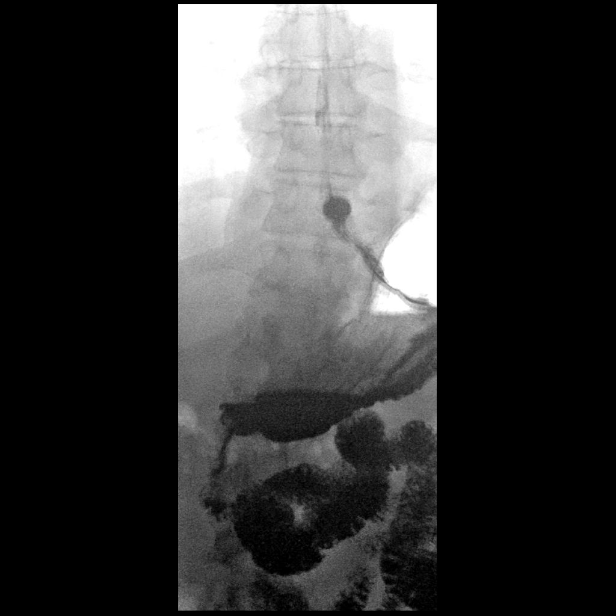

[14 of 24 positions shown; findings below may reference images not displayed]

FINDINGS: Initial barium swallows demonstrate normal pharyngeal motion with
swallowing. No laryngeal penetration or aspiration. No upper
esophageal webs, strictures or diverticuli.

Nonspecific esophageal motility disorder with occasional disruption
of the primary peristaltic wave and occasional tertiary
contractions.

Small sliding-type hiatal hernia. No demonstrable GE reflux. Mild
smooth narrowing at the GE junction. The 13 mm barium pill became
lodged tear. Findings suggest a reflux stricture. Recommend
endoscopic evaluation and potential treatment.
IMPRESSION: 1. Nonspecific esophageal motility disorder.
2. Small sliding-type hiatal hernia without demonstrable GE reflux.
3. Mild smooth strictured narrowing at the GE junction. The 13 mm
barium pill would not pass through this area. Recommend endoscopic
evaluation and potential treatment.

## 2020-11-30 DIAGNOSIS — Z1211 Encounter for screening for malignant neoplasm of colon: Secondary | ICD-10-CM | POA: Diagnosis not present

## 2020-11-30 DIAGNOSIS — R933 Abnormal findings on diagnostic imaging of other parts of digestive tract: Secondary | ICD-10-CM | POA: Diagnosis not present

## 2020-11-30 DIAGNOSIS — R1310 Dysphagia, unspecified: Secondary | ICD-10-CM | POA: Diagnosis not present

## 2020-12-21 DIAGNOSIS — Z79899 Other long term (current) drug therapy: Secondary | ICD-10-CM | POA: Diagnosis not present

## 2021-02-15 ENCOUNTER — Other Ambulatory Visit: Payer: Self-pay | Admitting: Surgery

## 2021-02-15 DIAGNOSIS — K6289 Other specified diseases of anus and rectum: Secondary | ICD-10-CM | POA: Diagnosis not present

## 2021-02-15 DIAGNOSIS — D125 Benign neoplasm of sigmoid colon: Secondary | ICD-10-CM | POA: Diagnosis not present

## 2021-02-15 DIAGNOSIS — K5730 Diverticulosis of large intestine without perforation or abscess without bleeding: Secondary | ICD-10-CM | POA: Diagnosis not present

## 2021-02-15 DIAGNOSIS — Z1211 Encounter for screening for malignant neoplasm of colon: Secondary | ICD-10-CM | POA: Diagnosis not present

## 2021-02-15 DIAGNOSIS — C20 Malignant neoplasm of rectum: Secondary | ICD-10-CM | POA: Diagnosis not present

## 2021-02-15 DIAGNOSIS — D122 Benign neoplasm of ascending colon: Secondary | ICD-10-CM | POA: Diagnosis not present

## 2021-02-15 DIAGNOSIS — K648 Other hemorrhoids: Secondary | ICD-10-CM | POA: Diagnosis not present

## 2021-02-15 DIAGNOSIS — K621 Rectal polyp: Secondary | ICD-10-CM | POA: Diagnosis not present

## 2021-02-17 ENCOUNTER — Ambulatory Visit
Admission: RE | Admit: 2021-02-17 | Discharge: 2021-02-17 | Disposition: A | Discharge status: Home / Self Care | Payer: BC Managed Care – PPO | Source: Ambulatory Visit | Attending: Gastroenterology | Admitting: Gastroenterology

## 2021-02-17 ENCOUNTER — Ambulatory Visit: Payer: BC Managed Care – PPO

## 2021-02-17 ENCOUNTER — Other Ambulatory Visit: Payer: Self-pay | Admitting: Gastroenterology

## 2021-02-17 DIAGNOSIS — R921 Mammographic calcification found on diagnostic imaging of breast: Secondary | ICD-10-CM | POA: Diagnosis not present

## 2021-02-17 DIAGNOSIS — C189 Malignant neoplasm of colon, unspecified: Secondary | ICD-10-CM | POA: Diagnosis not present

## 2021-02-17 DIAGNOSIS — N202 Calculus of kidney with calculus of ureter: Secondary | ICD-10-CM | POA: Diagnosis not present

## 2021-02-17 IMAGING — CT CT CHEST-ABD-PELV W/ CM
2 of 5 series · 14 of 46 positions shown, 16 images · IV contrast (iopamidol)
Comparison: Chest radiograph [DATE].

CLINICAL DATA: Adenocarcinoma of the colon.

EXAM:
CT CHEST, ABDOMEN, AND PELVIS WITH CONTRAST
TECHNIQUE: Multidetector CT imaging of the chest, abdomen and pelvis was
performed following the standard protocol during bolus
administration of intravenous contrast.
CONTRAST:  100mL [UD] IOPAMIDOL ([UD]) INJECTION 61%
Creatinine was obtained on site at [HOSPITAL] at [REDACTED].
Results: Creatinine 0.8 mg/dL.

[Series 2: cap with 5.00 br40 s3 axial · axial · 0.83mm/px · z∈[+1230,+1760]mm · 11 of 126 slices shown, 13 images]
[im 10/126  soft-tissue]
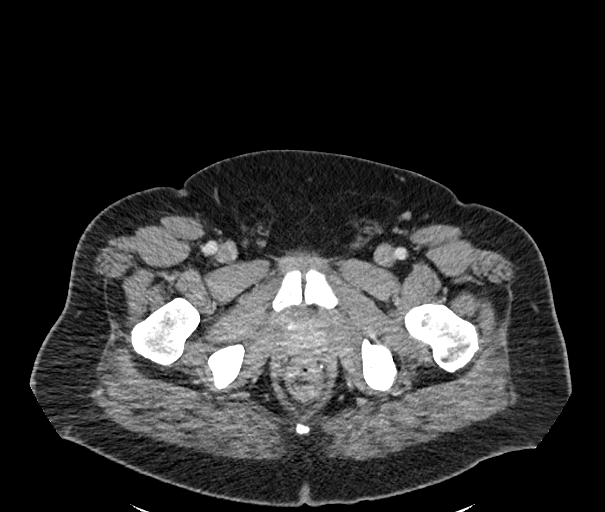
[im 10/126  bone]
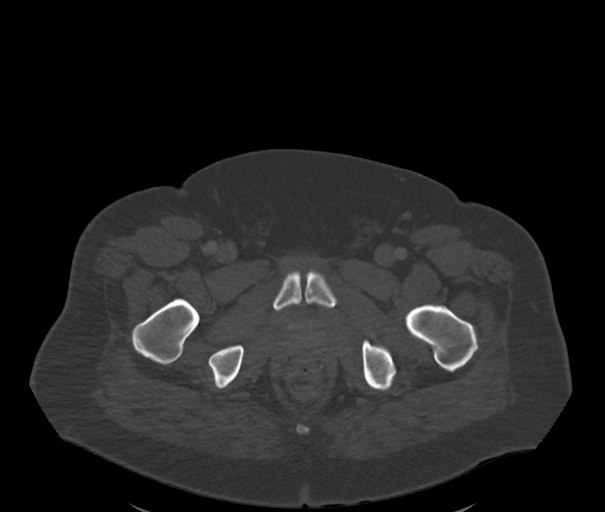
[im 20/126  soft-tissue]
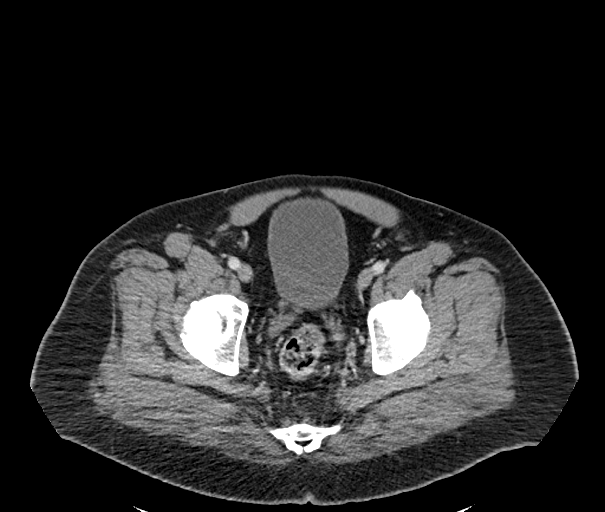
[im 29/126  soft-tissue]
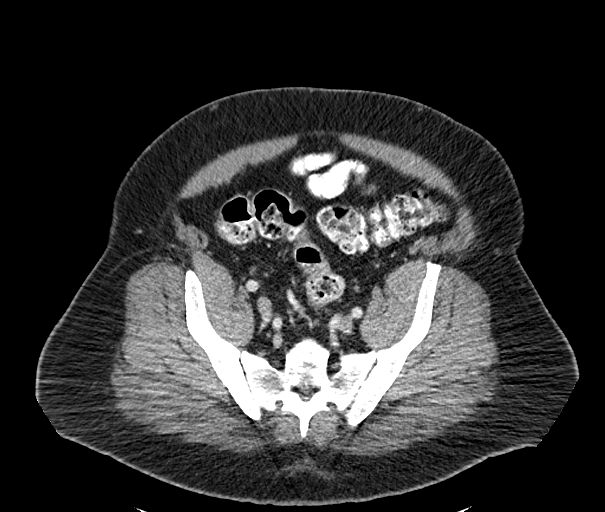
[im 39/126  soft-tissue]
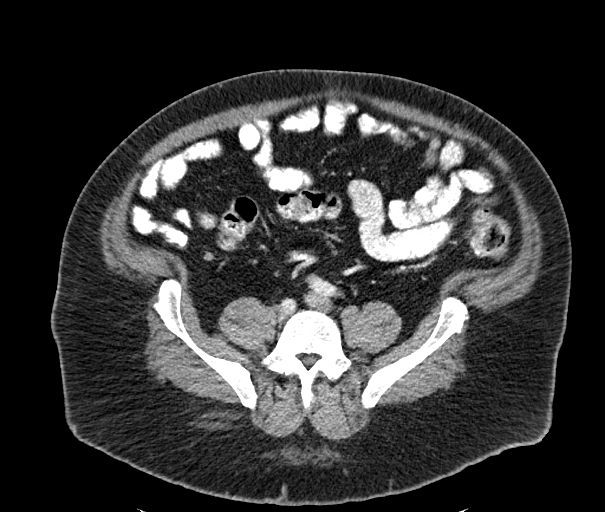
[im 49/126  soft-tissue]
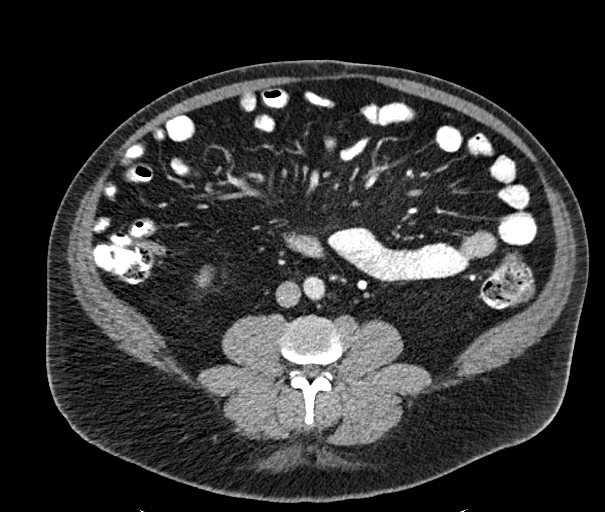
[im 68/126  soft-tissue]
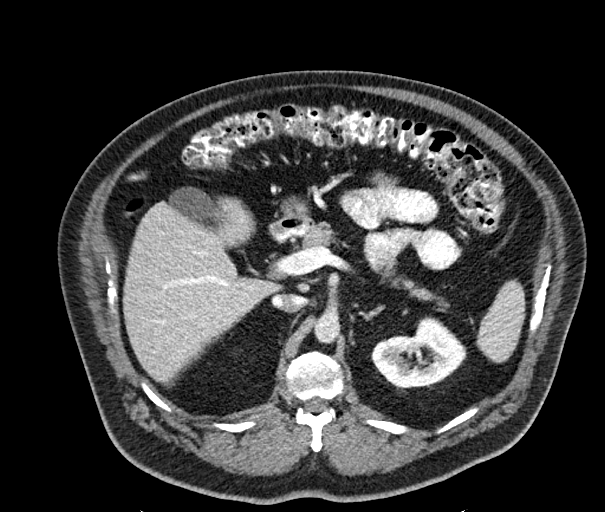
[im 77/126  soft-tissue]
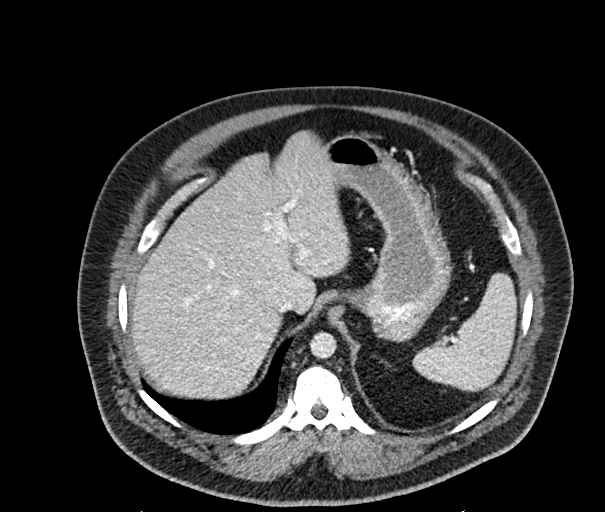
[im 87/126  soft-tissue]
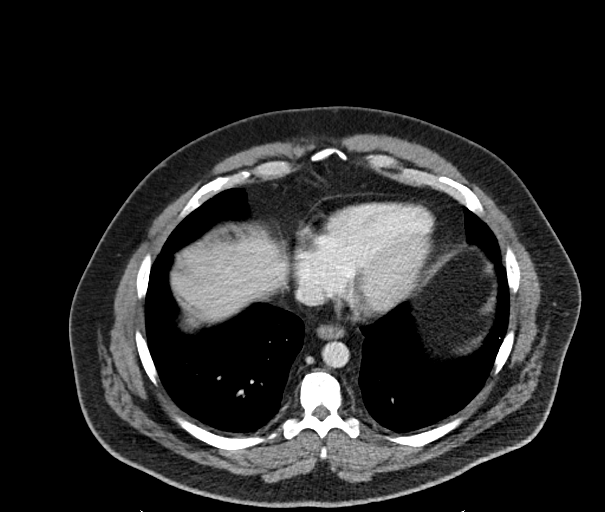
[im 97/126  soft-tissue]
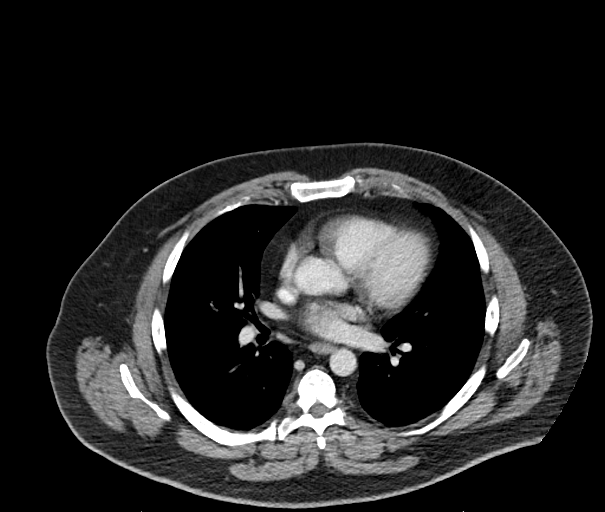
[im 97/126  bone]
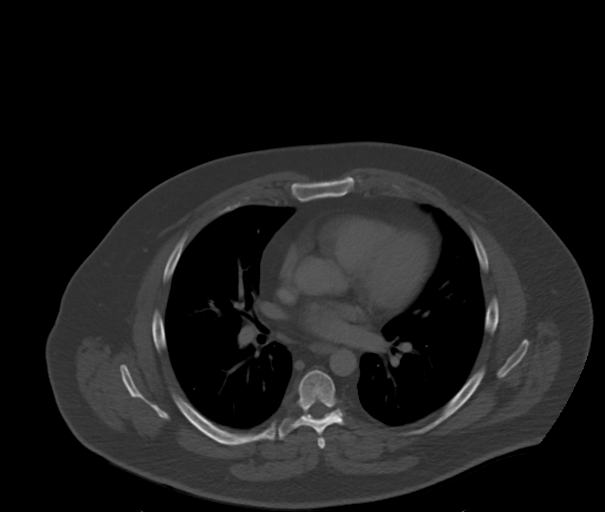
[im 106/126  soft-tissue]
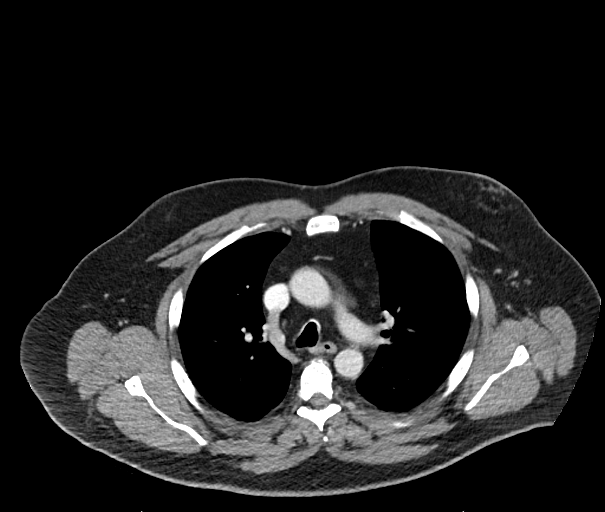
[im 116/126  soft-tissue]
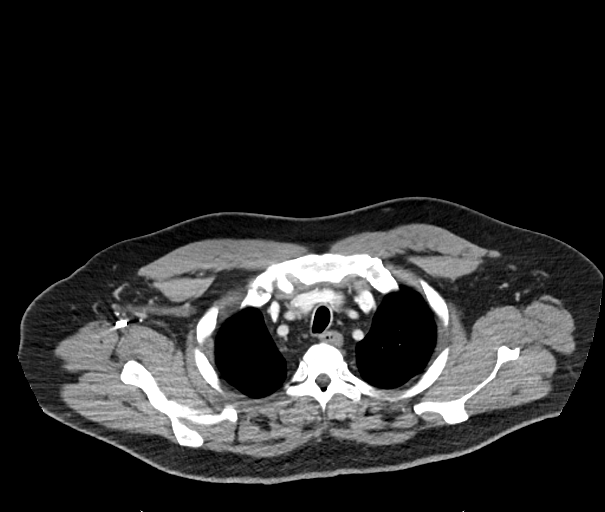

[Series 6: cap with 2.00 br40 s3 cor · coronal · 1.09mm/px · 3 of 227 slices shown]
[im 76/227  soft-tissue]
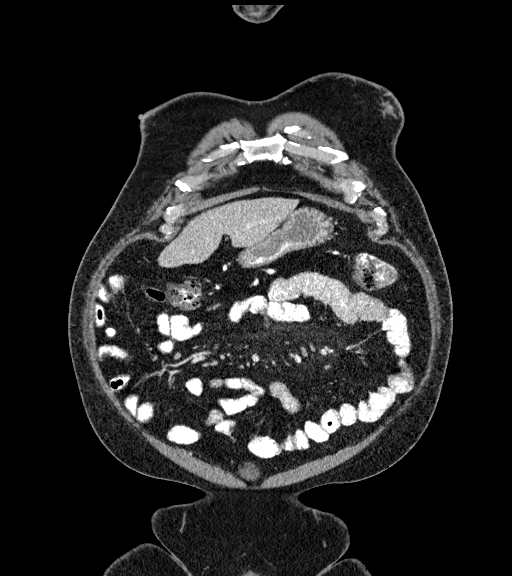
[im 101/227  soft-tissue]
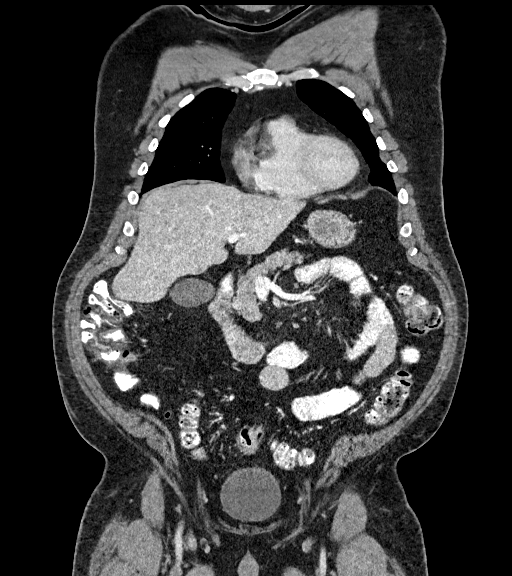
[im 126/227  soft-tissue]
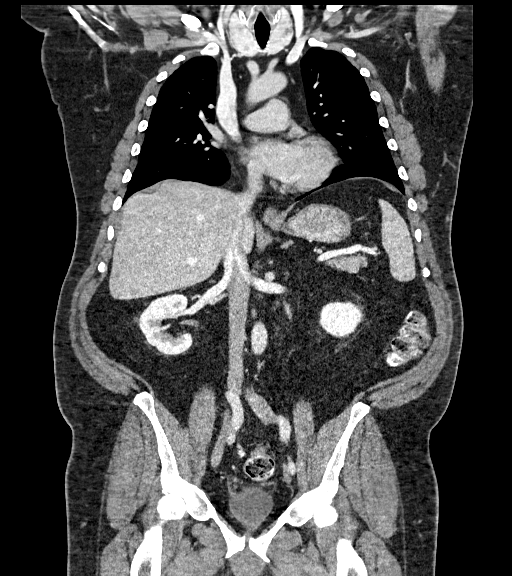

[14 of 46 positions shown; findings below may reference images not displayed]

FINDINGS: CT CHEST FINDINGS

Cardiovascular: Normal aortic caliber. Mild cardiomegaly, without
pericardial effusion. No central pulmonary embolism, on this
non-dedicated study.

Mediastinum/Nodes: No supraclavicular adenopathy. Tiny bilateral
hypoattenuating thyroid nodules. Not clinically significant; no
follow-up imaging recommended (ref: [HOSPITAL]. [DATE]): 143-50).

No mediastinal or hilar adenopathy.

Lungs/Pleura: No pleural fluid. Multiple tiny left greater than
right lower lobe calcified granulomas.

2 mm Perifissural left upper lobe pulmonary nodule on 61/4 is most
likely a subpleural lymph node.

Musculoskeletal: No acute osseous abnormality. Moderate left and
mild right sided gynecomastia.

CT ABDOMEN PELVIS FINDINGS

Hepatobiliary: Normal liver. Normal gallbladder, without biliary
ductal dilatation.

Pancreas: Normal, without mass or ductal dilatation.

Spleen: Normal in size, without focal abnormality.

Adrenals/Urinary Tract: Normal adrenal glands. Normal left kidney,
without hydronephrosis. Punctate right renal collecting system
calculus. Normal urinary bladder.

Stomach/Bowel: Normal stomach, without wall thickening. There are
areas of apparent mild rectal wall thickening including on image 110
and 115 of series 2 which may be due to underdistention. Otherwise,
no primary colonic mass identified. No obstruction. Normal terminal
ileum and appendix. Normal small bowel.

Vascular/Lymphatic: Normal caliber of the aorta and branch vessels.
Upper normal portal caval node of 1.2 cm is most likely reactive. No
pelvic sidewall adenopathy. No perirectal or sigmoid mesocolon
adenopathy.

Reproductive: Normal prostate.

Other: No significant free fluid. No evidence of omental or
peritoneal disease. Fat containing ventral abdominal wall laxity or
hernia is small.

Musculoskeletal: No acute osseous abnormality.
IMPRESSION: 1. No acute process or evidence of metastatic disease within the
chest, abdomen, or pelvis.
2. Areas of apparent mild rectal wall thickening could be due to
underdistention. Otherwise, no primary colonic mass identified. No
obstruction or other acute complication.
3. Right nephrolithiasis

## 2021-02-17 MED ORDER — IOPAMIDOL (ISOVUE-300) INJECTION 61%
100.0000 mL | Freq: Once | INTRAVENOUS | Status: AC | PRN
  Administered 2021-02-17: 100 mL via INTRAVENOUS

## 2021-02-18 ENCOUNTER — Telehealth: Payer: Self-pay | Admitting: Oncology

## 2021-02-18 NOTE — Telephone Encounter (Signed)
I received a new pt referral from Dr. Therisa Doyne at [facility] for rectal mass. Mr. More has been cld and scheduled to see Dr. Benay Spice on 4/22 at 1:30pm. Pt aware to arrive 15 minutes early.

## 2021-02-24 ENCOUNTER — Encounter: Payer: Self-pay | Admitting: *Deleted

## 2021-02-24 DIAGNOSIS — C20 Malignant neoplasm of rectum: Secondary | ICD-10-CM | POA: Insufficient documentation

## 2021-02-24 NOTE — Progress Notes (Signed)
Spoke with patient and provided directions to office, arrival time and what will occur during his visit. Informed him he may bring one visitor to the appointment if wishes. He denies any s/s of covid or recent exposure. Reports he has had covid in January 2022--mild case.

## 2021-02-25 ENCOUNTER — Inpatient Hospital Stay: Payer: BC Managed Care – PPO | Attending: Oncology | Admitting: Oncology

## 2021-02-25 ENCOUNTER — Telehealth: Payer: Self-pay | Admitting: Oncology

## 2021-02-25 ENCOUNTER — Other Ambulatory Visit: Payer: Self-pay

## 2021-02-25 VITALS — BP 135/90 | HR 93 | Temp 97.8°F | Resp 18 | Ht 68.0 in | Wt 248.2 lb

## 2021-02-25 DIAGNOSIS — L4050 Arthropathic psoriasis, unspecified: Secondary | ICD-10-CM

## 2021-02-25 DIAGNOSIS — Z8616 Personal history of COVID-19: Secondary | ICD-10-CM

## 2021-02-25 DIAGNOSIS — E119 Type 2 diabetes mellitus without complications: Secondary | ICD-10-CM

## 2021-02-25 DIAGNOSIS — C20 Malignant neoplasm of rectum: Secondary | ICD-10-CM | POA: Diagnosis not present

## 2021-02-25 DIAGNOSIS — Z803 Family history of malignant neoplasm of breast: Secondary | ICD-10-CM

## 2021-02-25 DIAGNOSIS — Z8042 Family history of malignant neoplasm of prostate: Secondary | ICD-10-CM

## 2021-02-25 NOTE — Telephone Encounter (Signed)
A genetic counseling appt has been scheduled for Mr. Coutts to see Denton Ar on 5/2 at 10am. Letter mailed.

## 2021-02-25 NOTE — Progress Notes (Signed)
[facility] New Patient Consult   Requesting NI:DPOE Eric Lewis 50 y.o.  [birth year]    Reason for Consult: Rectal cancer   HPI: Eric Lewis was referred to Dr. Therisa Doyne for his for screening colonoscopy.  A mass was palpated on rectal exam.  A nonobstructing mass was noted at 2-3 cm from the anal verge.  The mass was removed piecemeal.  The area was tattooed.  Polyps were removed from the ascending and sigmoid colon.  The polyp was removed from the rectum. The pathology revealed a tubular adenoma of the ascending colon.  The sigmoid and rectal polyps returned as hyperplastic polyps.  The biopsy from the rectum revealed invasive moderately differentiated adenocarcinoma.  He reports an episode of bleeding when wiping earlier this year.  This has not recurred.  He was referred for CTs of the chest, abdomen, and pelvis on 02/17/2021.  There is a 2 mm perifissural left upper lobe nodule, likely a subpleural lymph node.  Normal liver.  There is mild rectal wall thickening.  No primary colonic mass.  No obstruction.  Upper normal portacaval node measured 1.2 cm.  No perirectal or sigmoid mesocolon adenopathy.  Fat-containing ventral abdominal wall hernia.  Past Medical History:  Diagnosis Date  . Diabetes mellitus without complication ([place])   . Hypercholesteremia   . Hypertension   . Obesity   . Psoriatic arthritis ([name])     .  COVID-24 November 2020   .  History of dysphagia- barium swallow 10/22/2020- mild smooth stricture at the GE junction  Past Surgical History:  Procedure Laterality Date  . CHONDROPLASTY Right 09/28/2014   Procedure: CHONDROPLASTY, PATELLA/FEMORAL;  Surgeon: Alta Corning, MD;  Location: [facility];  Service: Orthopedics;  Laterality: Right;  . KNEE ARTHROSCOPY WITH MEDIAL MENISECTOMY Right 09/28/2014   Procedure: KNEE ARTHROSCOPY WITH MEDIAL MENISECTOMY;  Surgeon: Alta Corning, MD;  Location: [facility];   Service: Orthopedics;  Laterality: Right;  . SYNOVECTOMY Right 09/28/2014   Procedure: SYNOVECTOMY, THREE COMPARTMENT;  Surgeon: Alta Corning, MD;  Location: [facility];  Service: Orthopedics;  Laterality: Right;    Medications: Reviewed  Allergies:  Allergies  Allergen Reactions  . Lisinopril Other (See Comments)    Patient is unsure of this allergy    Family history: His paternal grandmother had breast cancer.  A paternal uncle has prostate cancer.  Social History:   He lives alone in [place].  He works in Therapist, occupational.  He does not use cigarettes or alcohol.  No transfusion history.  No risk factor for HIV or hepatitis.  ROS:   Positives include: Negative  A complete ROS was otherwise negative.  Physical Exam:  Blood pressure 135/90, pulse 93, temperature 97.8 F (36.6 C), temperature source Tympanic, resp. rate 18, height $RemoveBe'5\' 8"'KlVjDuUbI$  (1.727 m), weight 248 lb 3.2 oz (112.6 kg), SpO2 96 %.  HEENT: Oropharynx without visible mass, neck without mass Lungs: Clear bilaterally Cardiac: Regular rate and rhythm Abdomen: No hepatosplenomegaly, nontender, reducible umbilical hernia GU: Testes without mass Vascular: No leg edema Lymph nodes: No cervical, supraclavicular, axillary, or inguinal nodes Neurologic: Alert and oriented, motor exam appears intact in the upper and lower extremities bilaterally Skin: No rash Musculoskeletal: No spine tenderness, arthritic changes at the DIP joint of the left fifth finger      Imaging: As per HPI, CT images from 02/17/2021 reviewed   Assessment/Plan:   1. Rectal cancer  Colonoscopy 02/15/2021- nonobstructing  mass at 2-3 cm in the anal verge, removed in a piecemeal fashion, pathology confirmed moderately differentiated adenocarcinoma  CTs chest, abdomen, and pelvis on 02/17/2021- no evidence of metastatic disease, no clear mass identified, areas of mild rectal wall thickening  2. History of dysphagia-status post a  barium swallow 10/22/2020- mild smooth stricture at the GE junction 3. Psoriatic arthritis 4. Diabetes 5. COVID-19 January 22   Disposition:   Eric Lewis has been diagnosed with rectal cancer.  He appears to have early stage disease based on the staging evaluation to date.  I discussed the recommended staging evaluation and treatment of rectal cancer with Eric Lewis.  He will be referred for a staging pelvic MRI.  He is scheduled to see Dr. Dema Severin next week.  We will make a referral to Dr. Lisbeth Renshaw.  He may be a candidate for upfront surgery if Dr. Dema Severin feels the tumor is resectable and the MRI reveals stage I disease.  If the MRI is consistent with more advanced disease we will recommend neoadjuvant therapy.  He will be referred to the genetics counselor.  His history does not suggest hereditary nonpolyposis colon cancer.  We will follow up on mismatch repair protein testing on the rectal biopsy.  I will present his case at the GI tumor conference.  Eric Lewis will return for an office visit in approximately 2 weeks.  We discussed diet and exercise maneuvers that may decrease the risk of developing polyps and colorectal cancer.  Betsy Coder, MD  02/25/2021, 2:03 PM

## 2021-02-28 ENCOUNTER — Other Ambulatory Visit: Payer: Self-pay

## 2021-02-28 DIAGNOSIS — C20 Malignant neoplasm of rectum: Secondary | ICD-10-CM

## 2021-03-01 ENCOUNTER — Telehealth: Payer: Self-pay | Admitting: Genetic Counselor

## 2021-03-01 NOTE — Telephone Encounter (Signed)
Received a call from Mr. Ryans to reschedule his genetic counseling appt to 5/3 at 1pm.

## 2021-03-02 ENCOUNTER — Other Ambulatory Visit: Payer: Self-pay

## 2021-03-02 ENCOUNTER — Ambulatory Visit (HOSPITAL_COMMUNITY)
Admission: RE | Admit: 2021-03-02 | Discharge: 2021-03-02 | Disposition: A | Discharge status: Home / Self Care | Payer: BC Managed Care – PPO | Source: Ambulatory Visit | Attending: Oncology | Admitting: Oncology

## 2021-03-02 ENCOUNTER — Other Ambulatory Visit: Payer: Self-pay | Admitting: Oncology

## 2021-03-02 DIAGNOSIS — C20 Malignant neoplasm of rectum: Secondary | ICD-10-CM | POA: Insufficient documentation

## 2021-03-02 DIAGNOSIS — R590 Localized enlarged lymph nodes: Secondary | ICD-10-CM | POA: Diagnosis not present

## 2021-03-02 DIAGNOSIS — I82890 Acute embolism and thrombosis of other specified veins: Secondary | ICD-10-CM | POA: Diagnosis not present

## 2021-03-02 IMAGING — MR MR PELVIS W/O CM
1 series · 18 of 40 positions shown · non-contrast
Comparison: CT chest abdomen and pelvis of [DATE]

CLINICAL DATA: Rectal cancer.

EXAM:
MRI PELVIS WITHOUT CONTRAST
TECHNIQUE: Multiplanar multisequence MR imaging of the pelvis was performed. No
intravenous contrast was administered. Ultrasound gel was
administered per rectum to optimize tumor evaluation.

[Series 2: T2 · sagittal · 3.0mm · 0.74mm/px · 18 of 40 slices shown]
[im 1/40]
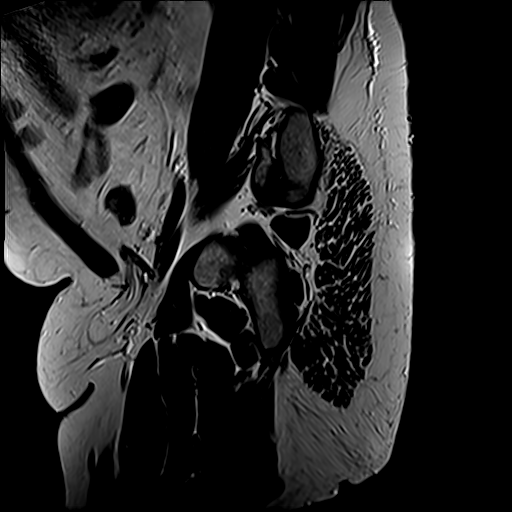
[im 2/40]
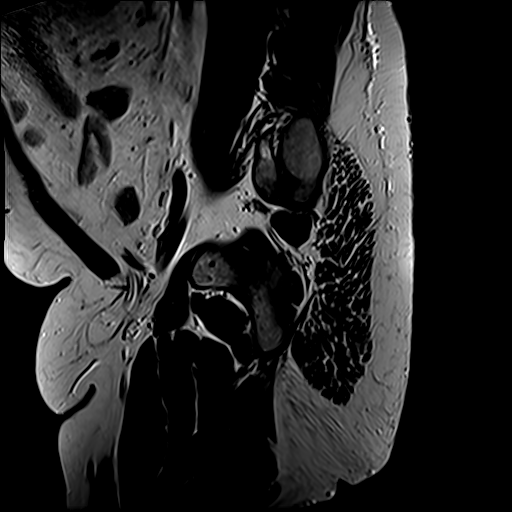
[im 3/40]
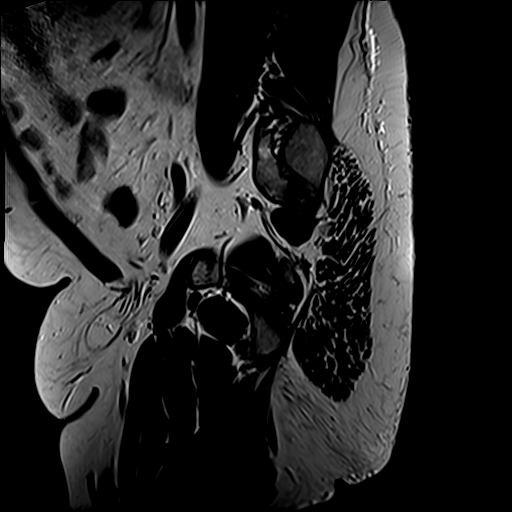
[im 4/40]
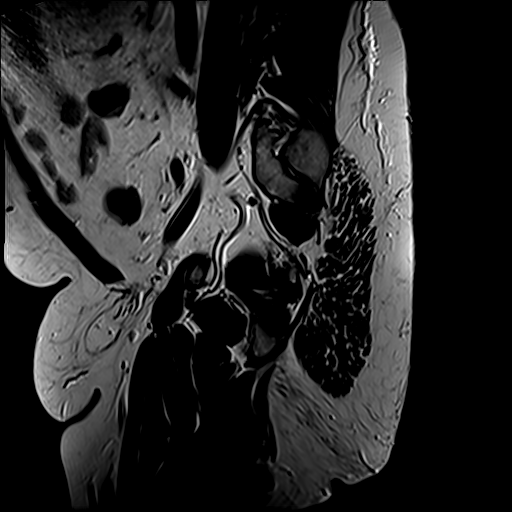
[im 5/40]
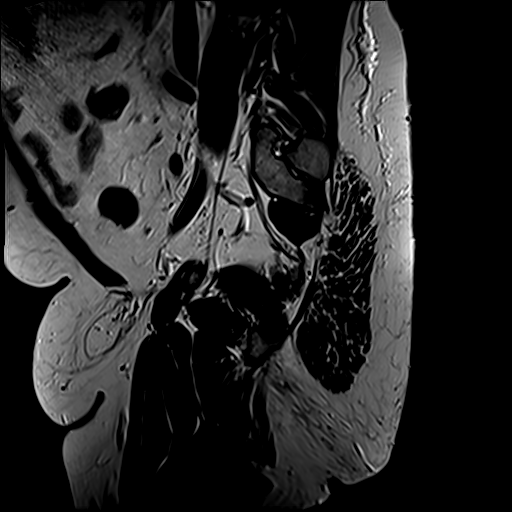
[im 6/40]
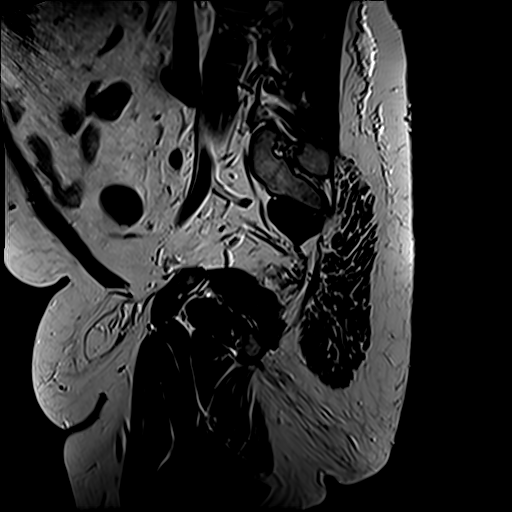
[im 7/40]
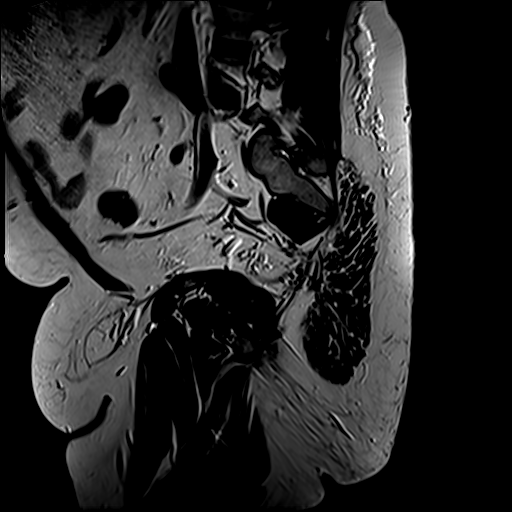
[im 8/40]
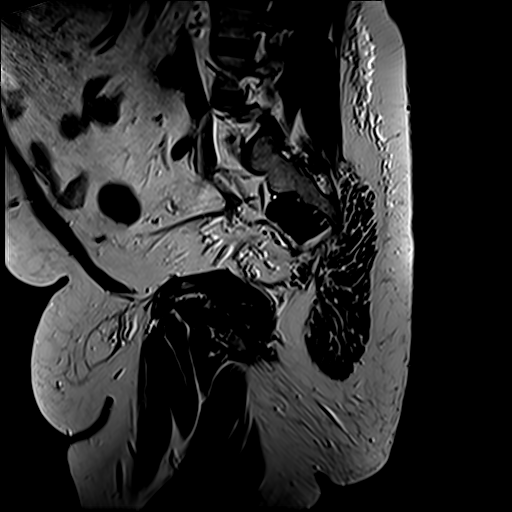
[im 9/40]
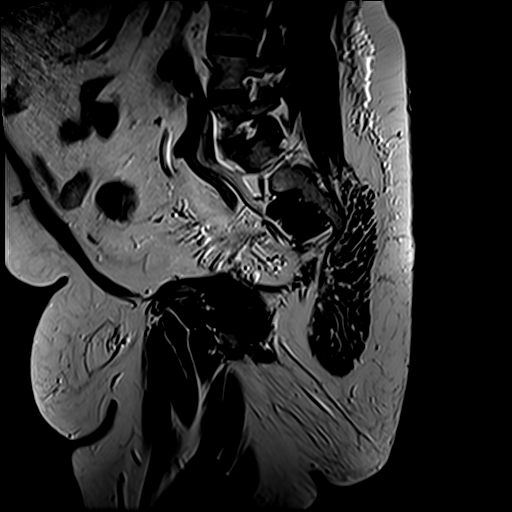
[im 10/40]
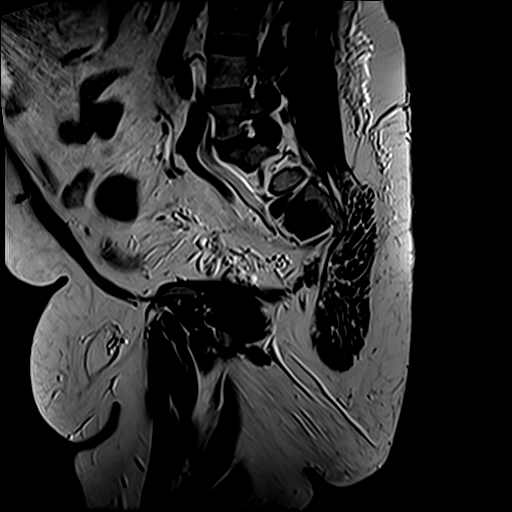
[im 13/40]
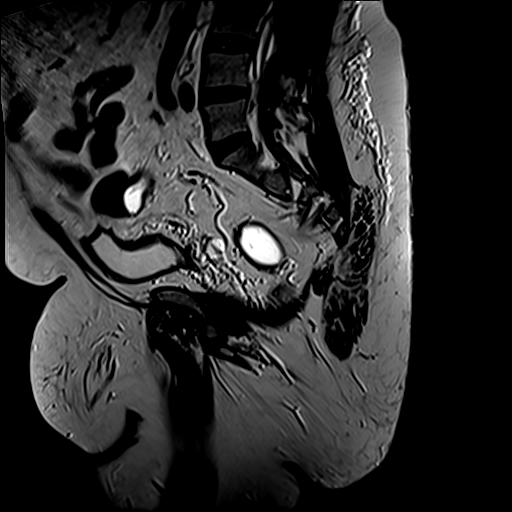
[im 18/40]
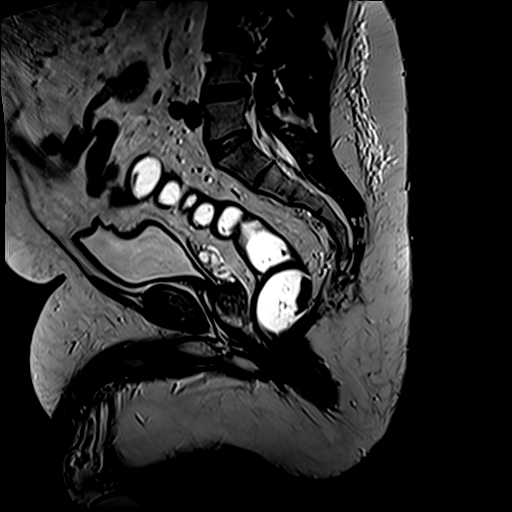
[im 21/40]
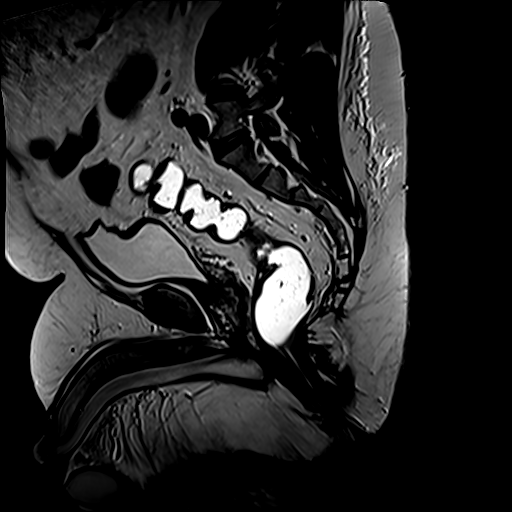
[im 23/40]
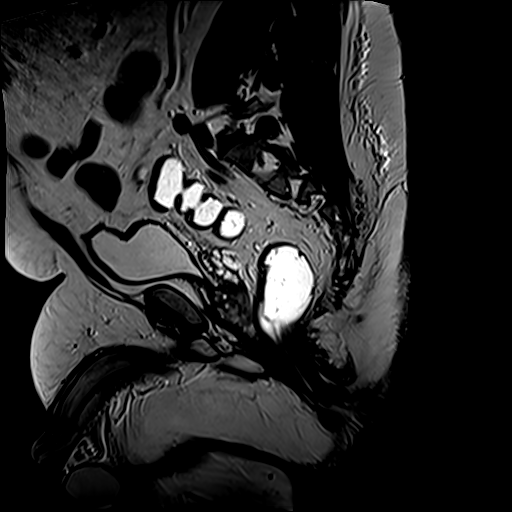
[im 28/40]
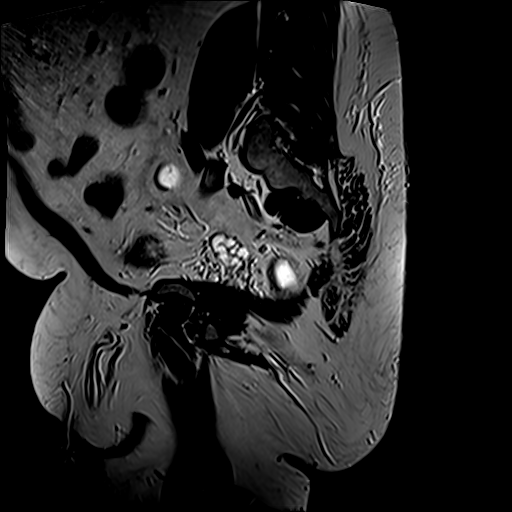
[im 33/40]
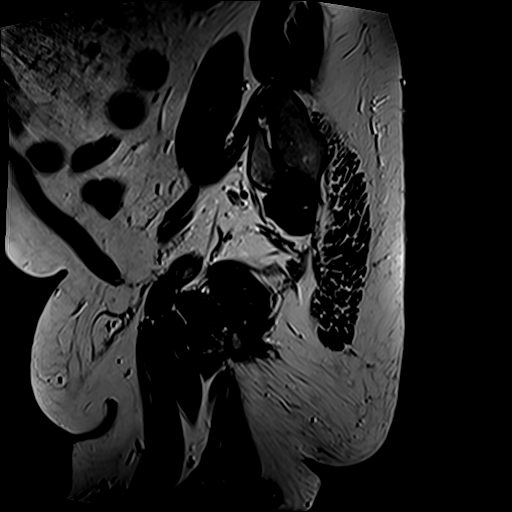
[im 34/40]
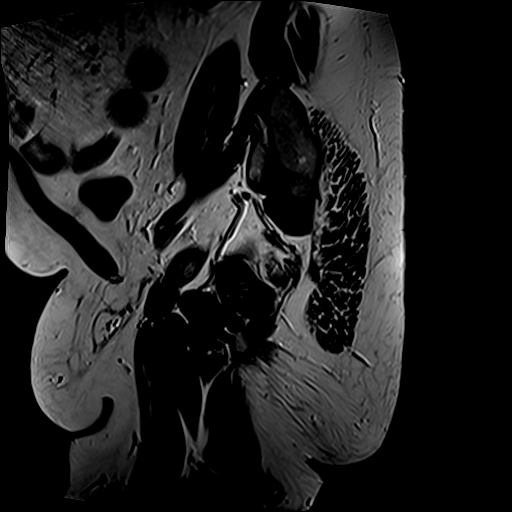
[im 38/40]
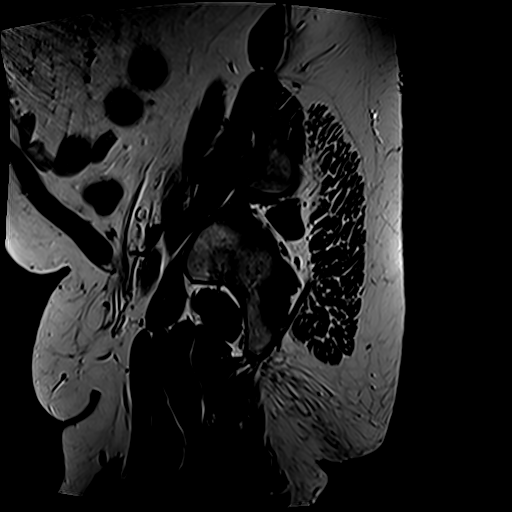

[18 of 40 positions shown; findings below may reference images not displayed]

FINDINGS: TUMOR LOCATION

Tumor distance from Anal Verge/Skin Surface: Tumor is not clearly
visible on the current exam. By report resected endoscopically and
near the anal verge though the dedicated colonoscopy report is not
available. There is mild irregularity along the LEFT anterolateral
wall of the rectum just above a the sphincter complex on image 13 of
series 9 it is not clear whether this represents the site of tumor.

distance to Internal Anal Sphincter: As above. The sphincter complex
has a normal appearance.

TUMOR DESCRIPTION

Circumferential Extent: Mild rectal wall irregularity just above the
sphincter complex and pelvic floor. No discrete tumor visible.

Tumor Length: Not applicable

T - CATEGORY

Extension through Muscularis Propria: No signs of extension beyond
muscularis propria but primary lesion is not well seen.

Shortest Distance of any tumor/node from Mesorectal Fascia: 3 mm, 7
mm LEFT mesorectal lymph node on image 2 of series 9 mm

Extramural Vascular Invasion/Tumor Thrombus: No

Invasion of Anterior Peritoneal Reflection: No

Involvement of Adjacent Organs or Pelvic Sidewall: No

Levator Ani Involvement: No

N - CATEGORY

Mesorectal Lymph Nodes >=5mm: LEFT mesorectal lymph node at 7 mm.
Other less than 5 mm lymph nodes, approximately 3 along the LEFT
anterolateral rectum. An additional 5 mm lymph node immediately
adjacent to this 7 mm lymph node, N1 disease.

Extra-mesorectal Lymphadenopathy: Lymph node just beyond the
mesorectum, technically potentially hypogastric lymph node best seen
on image 24 of series 6 measuring 7 mm. This displaced similar
signal characteristics to the mesorectal lymph node, dominant
mesorectal lymph node described above.

Other:  None.
IMPRESSION: Rectal adenocarcinoma T stage: No extension beyond muscularis is
identified of the rectum though the reported tumor is not well seen.

Rectal adenocarcinoma N stage: N1 disease with respect to mesorectal
lymph nodes but with mildly suspicious lymph node just beyond the
mesorectum along the LEFT hypogastric distribution. Could consider
PET scan for further assessment as warranted.

Distance from tumor to the internal anal sphincter is difficult to
assess. Some irregularity is just above the sphincter complex but it
is not clear that this represents the site of tumor. This is in the
LEFT anterolateral rectum [REDACTED] be within 5-6 mm of the sphincter
complex.

## 2021-03-03 ENCOUNTER — Ambulatory Visit
Admission: RE | Admit: 2021-03-03 | Discharge: 2021-03-03 | Disposition: A | Discharge status: Home / Self Care | Payer: BC Managed Care – PPO | Source: Ambulatory Visit | Attending: Radiation Oncology | Admitting: Radiation Oncology

## 2021-03-03 ENCOUNTER — Other Ambulatory Visit: Payer: Self-pay

## 2021-03-03 VITALS — BP 143/90 | HR 89 | Temp 97.1°F | Resp 18 | Ht 68.0 in | Wt 248.1 lb

## 2021-03-03 DIAGNOSIS — I10 Essential (primary) hypertension: Secondary | ICD-10-CM | POA: Diagnosis not present

## 2021-03-03 DIAGNOSIS — E041 Nontoxic single thyroid nodule: Secondary | ICD-10-CM | POA: Diagnosis not present

## 2021-03-03 DIAGNOSIS — E119 Type 2 diabetes mellitus without complications: Secondary | ICD-10-CM | POA: Diagnosis not present

## 2021-03-03 DIAGNOSIS — E7800 Pure hypercholesterolemia, unspecified: Secondary | ICD-10-CM | POA: Diagnosis not present

## 2021-03-03 DIAGNOSIS — N200 Calculus of kidney: Secondary | ICD-10-CM | POA: Insufficient documentation

## 2021-03-03 DIAGNOSIS — C20 Malignant neoplasm of rectum: Secondary | ICD-10-CM | POA: Diagnosis not present

## 2021-03-03 DIAGNOSIS — L4050 Arthropathic psoriasis, unspecified: Secondary | ICD-10-CM | POA: Insufficient documentation

## 2021-03-03 DIAGNOSIS — E669 Obesity, unspecified: Secondary | ICD-10-CM | POA: Insufficient documentation

## 2021-03-03 DIAGNOSIS — Z803 Family history of malignant neoplasm of breast: Secondary | ICD-10-CM | POA: Diagnosis not present

## 2021-03-03 DIAGNOSIS — N62 Hypertrophy of breast: Secondary | ICD-10-CM | POA: Diagnosis not present

## 2021-03-03 DIAGNOSIS — Z7984 Long term (current) use of oral hypoglycemic drugs: Secondary | ICD-10-CM | POA: Diagnosis not present

## 2021-03-03 DIAGNOSIS — Z79899 Other long term (current) drug therapy: Secondary | ICD-10-CM | POA: Insufficient documentation

## 2021-03-03 NOTE — Progress Notes (Signed)
GI Location of Tumor / Histology: Rectal Cancer  Gregery Na presented with bright red blood per rectum for several months.  He was seen for evaluation by Dr. Therisa Doyne in GI.  MRI Pelvis 03/02/2021:  CT CAP 02/17/2021:No acute process or evidence of metastatic disease within the chest, abdomen, or pelvis.  Areas of apparent mild rectal wall thickening could be due to underdistention. Otherwise, no primary colonic mass identified. No obstruction or other acute complication.  Colonoscopy 02/15/2021: Several polyps that were tubular adenomatous and/or hyperplastic in nature of the ascending and sigmoid colon.  Hyperplastic polyp in the rectum and an invasive adenocarcinoma in the rectum.  Biopsies of   Past/Anticipated interventions by surgeon, if any:  Dr. Dema Severin 03/02/2021 -We discussed a lot of decision making is pending completion of his MRI. -We will plan to see him back in out office next week on Tuesday for further discussions and plans of care as we will then have the results of his MRI at that time. -We discussed that if he had a T1-type lesion, potential for a minimally invasive transanal resection.  If a later local stage is found on his MRI, a more nuanced approach based on findings- potential for surgery versus neoadjuvant chemotherapy/chemoradiation. -Follow-up 03/08/2021  Past/Anticipated interventions by medical oncology, if any:  Dr. Benay Spice 02/25/2021 -Mr. David has been diagnosed with rectal cancer.  He appears to have early stage disease based on the staging evaluation to date - He will be referred for a staging pelvic MRI. -He is scheduled to see Dr. Dema Severin next week.  We will make a referral to Dr. Lisbeth Renshaw. -He may be a candidate for upfront surgery if Dr. Dema Severin feels the tumor is resectable and the MRI reveals stage I disease.  If the MRI is consistent with more advanced disease we will recommend neoadjuvant therapy. -He will be referred to the genetics  counselor. -Follow-up 03/11/2021  Weight changes, if any: steady  Bowel/Bladder complaints, if any: No  Nausea / Vomiting, if any: No  Pain issues, if any:  No  Any blood per rectum: no  SAFETY ISSUES:  Prior radiation? No  Pacemaker/ICD? No  Possible current pregnancy? n/a  Is the patient on methotrexate? No  Current Complaints/Details:

## 2021-03-03 NOTE — Progress Notes (Signed)
Radiation Oncology         [phone]) [phone] ________________________________  Name: Eric Lewis        MRN: [id number]  Date of Service: 03/03/2021 DOB: 10/16/1971  UU:VOZDGUY, Dibas, MD  Lujean Amel, MD     REFERRING PHYSICIAN: Koirala, Dibas, MD   DIAGNOSIS: The encounter diagnosis was Adenocarcinoma of rectum ([place]).   HISTORY OF PRESENT ILLNESS: Eric Lewis is a 50 y.o. male seen at the request of Dr. Learta Codding for newly diagnosed rectal cancer.  The patient had experienced bright red blood per rectum for several weeks a few months ago. Given the new guidelines, his PCP referred him to see Dr. Therisa Doyne in GI.  On colonoscopy on 02/15/2021 was performed by and revealed several polyps that were tubular adenomatous and/or hyperplastic in nature of the ascending and sigmoid colon however there was a hyperplastic polyp in the rectum and an invasive adenocarcinoma in the rectum as well.  CT chest abdomen and pelvis on 02/17/2021 did not show evidence of metastatic disease, there was apparent mild rectal wall thickening.  He has met Dr. Dema Severin and Dr. Benay Spice.  He proceeded with an MRI scan yesterday, which was performed but not read by radiology yet.  He is seen today to discuss treatment recommendations of his cancer.     PREVIOUS RADIATION THERAPY: No   PAST MEDICAL HISTORY:  Past Medical History:  Diagnosis Date  . Diabetes mellitus without complication ([place])   . Hypercholesteremia   . Hypertension   . Obesity   . Psoriatic arthritis ([place])        PAST SURGICAL HISTORY: Past Surgical History:  Procedure Laterality Date  . CHONDROPLASTY Right 09/28/2014   Procedure: CHONDROPLASTY, PATELLA/FEMORAL;  Surgeon: Alta Corning, MD;  Location: [facility];  Service: Orthopedics;  Laterality: Right;  . KNEE ARTHROSCOPY WITH MEDIAL MENISECTOMY Right 09/28/2014   Procedure: KNEE ARTHROSCOPY WITH MEDIAL MENISECTOMY;  Surgeon: Alta Corning, MD;  Location: [facility];  Service: Orthopedics;  Laterality: Right;  . SYNOVECTOMY Right 09/28/2014   Procedure: SYNOVECTOMY, THREE COMPARTMENT;  Surgeon: Alta Corning, MD;  Location: [facility];  Service: Orthopedics;  Laterality: Right;     FAMILY HISTORY:  Family History  Problem Relation Age of Onset  . Emphysema Maternal Grandmother   . Breast cancer Paternal Grandmother      SOCIAL HISTORY:  reports that he has never smoked. He has never used smokeless tobacco. He reports that he does not drink alcohol and does not use drugs. The patient is single and lives in [place]. 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 He is active in his church.   ALLERGIES: Lisinopril   MEDICATIONS:  Current Outpatient Medications  Medication Sig Dispense Refill  . Continuous Blood Gluc Receiver (FREESTYLE LIBRE 14 DAY READER) DEVI See admin instructions.    Marland Kitchen FARXIGA 5 MG TABS tablet Take 5 mg by mouth daily.    Marland Kitchen loratadine (CLARITIN) 10 MG tablet Take 10 mg by mouth daily.    . metFORMIN (GLUCOPHAGE) 500 MG tablet Take 2 tablets by mouth 2 (two) times daily.    Marland Kitchen OTEZLA 30 MG TABS Take 1 tablet by mouth 2 (two) times daily.    . simvastatin (ZOCOR) 40 MG tablet Take 40 mg by mouth at bedtime.    . Continuous Blood Gluc Sensor (FREESTYLE LIBRE 14 DAY SENSOR) MISC USE AS DIRECTED EVERY 14 DAYS     No current  facility-administered medications for this encounter.     REVIEW OF SYSTEMS: On review of systems, the patient reports that he is doing well overall. He denies any bowel or bladder changes. He has not noticed recent bleeding or pain with bowel movements. He denies abdominal or pelvic pain or pressure. No other complaints are noted.    PHYSICAL EXAM:  Wt Readings from Last 3 Encounters:  03/03/21 248 lb 2 oz (112.5 kg)  02/25/21 248 lb 3.2 oz (112.6 kg)  05/30/16 276 lb 5 oz (125.3 kg)   Temp Readings from Last 3 Encounters:  03/03/21 (!) 97.1 F (36.2 C) (Temporal)   02/25/21 97.8 F (36.6 C) (Tympanic)  05/30/16 99.1 F (37.3 C) (Oral)   BP Readings from Last 3 Encounters:  03/03/21 (!) 143/90  02/25/21 135/90  05/30/16 118/62   Pulse Readings from Last 3 Encounters:  03/03/21 89  02/25/21 93  05/30/16 98   Pain Assessment Pain Score: 0-No pain/10  In general this is a well appearing caucasian male in no acute distress. He's alert and oriented x4 and appropriate throughout the examination. Cardiopulmonary assessment is negative for acute distress and he exhibits normal effort.    ECOG = 0  0 - Asymptomatic (Fully active, able to carry on all predisease activities without restriction)  1 - Symptomatic but completely ambulatory (Restricted in physically strenuous activity but ambulatory and able to carry out work of a light or sedentary nature. For example, light housework, office work)  2 - Symptomatic, <50% in bed during the day (Ambulatory and capable of all self care but unable to carry out any work activities. Up and about more than 50% of waking hours)  3 - Symptomatic, >50% in bed, but not bedbound (Capable of only limited self-care, confined to bed or chair 50% or more of waking hours)  4 - Bedbound (Completely disabled. Cannot carry on any self-care. Totally confined to bed or chair)  5 - Death   Eustace Pen MM, Creech RH, Tormey DC, et al. [phone]). "Toxicity and response criteria of the [facility]". [place] Oncol. 5 (6): 649-55    LABORATORY DATA:  Lab Results  Component Value Date   WBC 15.3 (H) 05/30/2016   HGB 14.5 05/30/2016   HCT 44.6 05/30/2016   MCV 86.4 05/30/2016   PLT 331 05/30/2016   Lab Results  Component Value Date   NA 135 05/30/2016   K 4.0 05/30/2016   CL 99 (L) 05/30/2016   CO2 25 05/30/2016   Lab Results  Component Value Date   ALT 45 05/30/2016   AST 19 05/30/2016   ALKPHOS 104 05/30/2016   BILITOT 1.6 (H) 05/30/2016      RADIOGRAPHY: CT CHEST ABDOMEN PELVIS W  CONTRAST  Result Date: 02/17/2021 CLINICAL DATA:  Adenocarcinoma of the colon. EXAM: CT CHEST, ABDOMEN, AND PELVIS WITH CONTRAST TECHNIQUE: Multidetector CT imaging of the chest, abdomen and pelvis was performed following the standard protocol during bolus administration of intravenous contrast. CONTRAST:  144mL ISOVUE-300 IOPAMIDOL (ISOVUE-300) INJECTION 61% Creatinine was obtained on site at [facility] at [address] Results: Creatinine 0.8 mg/dL. COMPARISON:  Chest radiograph 05/30/2016. FINDINGS: CT CHEST FINDINGS Cardiovascular: Normal aortic caliber. Mild cardiomegaly, without pericardial effusion. No central pulmonary embolism, on this non-dedicated study. Mediastinum/Nodes: No supraclavicular adenopathy. Tiny bilateral hypoattenuating thyroid nodules. Not clinically significant; no follow-up imaging recommended (ref: J Am Coll Radiol. 2015 Feb;12(2): 143-50). No mediastinal or hilar adenopathy. Lungs/Pleura: No pleural fluid. Multiple tiny  left greater than right lower lobe calcified granulomas. 2 mm Perifissural left upper lobe pulmonary nodule on 61/4 is most likely a subpleural lymph node. Musculoskeletal: No acute osseous abnormality. Moderate left and mild right sided gynecomastia. CT ABDOMEN PELVIS FINDINGS Hepatobiliary: Normal liver. Normal gallbladder, without biliary ductal dilatation. Pancreas: Normal, without mass or ductal dilatation. Spleen: Normal in size, without focal abnormality. Adrenals/Urinary Tract: Normal adrenal glands. Normal left kidney, without hydronephrosis. Punctate right renal collecting system calculus. Normal urinary bladder. Stomach/Bowel: Normal stomach, without wall thickening. There are areas of apparent mild rectal wall thickening including on image 110 and 115 of series 2 which may be due to underdistention. Otherwise, no primary colonic mass identified. No obstruction. Normal terminal ileum and appendix. Normal small bowel. Vascular/Lymphatic: Normal  caliber of the aorta and branch vessels. Upper normal portal caval node of 1.2 cm is most likely reactive. No pelvic sidewall adenopathy. No perirectal or sigmoid mesocolon adenopathy. Reproductive: Normal prostate. Other: No significant free fluid. No evidence of omental or peritoneal disease. Fat containing ventral abdominal wall laxity or hernia is small. Musculoskeletal: No acute osseous abnormality. IMPRESSION: 1. No acute process or evidence of metastatic disease within the chest, abdomen, or pelvis. 2. Areas of apparent mild rectal wall thickening could be due to underdistention. Otherwise, no primary colonic mass identified. No obstruction or other acute complication. 3. Right nephrolithiasis Electronically Signed   By: Abigail Miyamoto M.D.   On: 02/17/2021 13:26       IMPRESSION/PLAN: 1. Adenocarcinoma of the Rectum. Dr. Lisbeth Renshaw discusses the pathology findings and reviews the nature of rectal carcinomas. We reviewed the rationale for chemoRT if he has T2+ or N1+ disease. We will follow up with the results of his MRI as soon as available. We discussed the risks, benefits, short, and long term effects of radiotherapy, as well as the curative intent, and the patient is interested in proceeding if indicated. Dr. Lisbeth Renshaw discusses the delivery and logistics of radiotherapy and anticipates a course of 5 1/2 weeks of radiotherapy. We will reach out once a more concrete plan is made based on his MRI results.  In a visit lasting 60 minutes, greater than 50% of the time was spent face to face with Dr. Lisbeth Renshaw and myself (via Jackquline Denmark) discussing the patient's condition, in preparation for the discussion, and coordinating the patient's care.   The above documentation reflects my direct findings during this shared patient visit. Please see the separate note by Dr. Lisbeth Renshaw on this date for the remainder of the patient's plan of care.    Carola Rhine, [facility]   **Disclaimer: This note was dictated with voice  recognition software. Similar sounding words can inadvertently be transcribed and this note may contain transcription errors which may not have been corrected upon publication of note.**

## 2021-03-04 LAB — SURGICAL PATHOLOGY

## 2021-03-07 ENCOUNTER — Telehealth: Payer: Self-pay | Admitting: Genetic Counselor

## 2021-03-07 ENCOUNTER — Encounter: Payer: BC Managed Care – PPO | Admitting: Licensed Clinical Social Worker

## 2021-03-07 ENCOUNTER — Other Ambulatory Visit: Payer: BC Managed Care – PPO

## 2021-03-07 NOTE — Telephone Encounter (Signed)
Discussed insurance coverage/cost of genetic counseling and genetic testing.

## 2021-03-08 ENCOUNTER — Telehealth: Payer: Self-pay | Admitting: Radiation Oncology

## 2021-03-08 ENCOUNTER — Inpatient Hospital Stay: Payer: BC Managed Care – PPO | Attending: Genetic Counselor | Admitting: Genetic Counselor

## 2021-03-08 ENCOUNTER — Encounter: Payer: Self-pay | Admitting: Genetic Counselor

## 2021-03-08 ENCOUNTER — Inpatient Hospital Stay: Payer: BC Managed Care – PPO

## 2021-03-08 ENCOUNTER — Other Ambulatory Visit: Payer: Self-pay

## 2021-03-08 ENCOUNTER — Other Ambulatory Visit: Payer: Self-pay | Admitting: Genetic Counselor

## 2021-03-08 DIAGNOSIS — Z8042 Family history of malignant neoplasm of prostate: Secondary | ICD-10-CM | POA: Diagnosis not present

## 2021-03-08 DIAGNOSIS — C20 Malignant neoplasm of rectum: Secondary | ICD-10-CM

## 2021-03-08 DIAGNOSIS — Z803 Family history of malignant neoplasm of breast: Secondary | ICD-10-CM | POA: Diagnosis not present

## 2021-03-08 LAB — GENETIC SCREENING ORDER

## 2021-03-08 NOTE — Progress Notes (Signed)
REFERRING PROVIDER: Ladell Pier, MD [address] [place],  [place] [address]  PRIMARY PROVIDER:  Lujean Amel, MD  PRIMARY REASON FOR VISIT:  1. Adenocarcinoma of rectum ([place])   2. Family history of breast cancer   3. Family history of prostate cancer       HISTORY OF PRESENT ILLNESS:   Eric Lewis, a 50 y.o. male, was seen for a [place] cancer genetics consultation at the request of Dr. Benay Spice due to a personal and family history of cancer.  Eric Lewis presents to clinic today to discuss the possibility of a hereditary predisposition to cancer, genetic testing, and to further clarify his future cancer risks, as well as potential cancer risks for family members.   In April of 2022, at the age of 68, Eric Lewis was diagnosed with rectal adenocarcinoma. The tumor had normal MMR protein IHC.   Past Medical History:  Diagnosis Date  . Diabetes mellitus without complication ([place])   . Family history of breast cancer   . Family history of prostate cancer   . Hypercholesteremia   . Hypertension   . Obesity   . Psoriatic arthritis [facility])     Past Surgical History:  Procedure Laterality Date  . CHONDROPLASTY Right 09/28/2014   Procedure: CHONDROPLASTY, PATELLA/FEMORAL;  Surgeon: Alta Corning, MD;  Location: [facility];  Service: Orthopedics;  Laterality: Right;  . KNEE ARTHROSCOPY WITH MEDIAL MENISECTOMY Right 09/28/2014   Procedure: KNEE ARTHROSCOPY WITH MEDIAL MENISECTOMY;  Surgeon: Alta Corning, MD;  Location: [place] [facility];  Service: Orthopedics;  Laterality: Right;  . SYNOVECTOMY Right 09/28/2014   Procedure: SYNOVECTOMY, THREE COMPARTMENT;  Surgeon: Alta Corning, MD;  Location: [place];  Service: Orthopedics;  Laterality: Right;    Social History   Socioeconomic History  . Marital status: Single    Spouse name: Not on file  . Number of children: Not on file  . Years of education: Not on file  . Highest  education level: Not on file  Occupational History  . Occupation: Secondary school teacher    Comment: Works from home  Tobacco Use  . Smoking status: Never Smoker  . Smokeless tobacco: Never Used  Substance and Sexual Activity  . Alcohol use: No    Alcohol/week: 0.0 standard drinks  . Drug use: No  . Sexual activity: Not on file  Other Topics Concern  . Not on file  Social History Narrative   Reports he is the middle child of two other siblings. 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   Social Determinants of Health   Financial Resource Strain: Not on file  Food Insecurity: Not on file  Transportation Needs: Not on file  Physical Activity: Not on file  Stress: Not on file  Social Connections: Not on file     FAMILY HISTORY:  We obtained a detailed, 4-generation family history.  Significant diagnoses are listed below: Family History  Problem Relation Age of Onset  . Emphysema Maternal Grandmother   . Breast cancer Paternal Grandmother        dx 59s  . Prostate cancer Paternal Uncle 39   Eric Lewis does not have children. He has two sisters (ages 61 and 98), neither of whom have had cancer.   Eric Lewis mother is alive at age 72 without cancer. There are two maternal aunts. There is no known cancer among maternal aunts/uncles or maternal cousins. Eric Lewis maternal grandmother died in her  late 25s without cancer. He does not have information about his maternal grandfather.   Eric Lewis father is alive at age 43 without cancer. There is one paternal uncle, who was diagnosed with prostate cancer around age 49. There is no known cancer among paternal aunts/uncles or paternal cousins. Eric Lewis paternal grandmother died in her 41s with breast cancer. His paternal grandfather died at age 41 without cancer.  Eric Lewis is unaware of previous family history of genetic testing for hereditary cancer risks. Patient's ancestors are of unknown descent. There is no reported  Ashkenazi Jewish ancestry. There is no known consanguinity.  GENETIC COUNSELING ASSESSMENT: Eric Lewis is a 50 y.o. male with a personal history of young-onset rectal cancer and a family history of breast and prostate cancer, which is somewhat suggestive of a hereditary cancer syndrome and predisposition to cancer. We, therefore, discussed and recommended the following at today's visit.   DISCUSSION: We discussed that approximately 5-10% of cancer is hereditary. Most cases of hereditary colorectal cancer are associated with the Lynch syndrome genes (EPCAM, MLH1, MSH2, MSH6, and PMS2), although there are other genes that can be associated with hereditary colorectal cancer syndromes. These include APC, MUTYH, CHEK2, PTEN, STK11, etc. We discussed that testing is beneficial for several reasons, including knowing about other cancer risks, identifying potential screening and risk-reduction options that may be appropriate, and to understand if other family members could be at risk for cancer and allow them to undergo genetic testing.  We reviewed the characteristics, features and inheritance patterns of hereditary cancer syndromes. We also discussed genetic testing, including the appropriate family members to test, the process of testing, insurance coverage and turn-around-time for results. We discussed the implications of a negative, positive and/or variant of uncertain significant result. We recommended Eric Lewis pursue genetic testing for the [employer] CancerNext-Expanded + RNAinsight gene panel.   The CancerNext-Expanded + RNAinsight gene panel offered by [employer] and includes sequencing and rearrangement analysis for the following 77 genes: AIP, ALK, APC, ATM, AXIN2, BAP1, BARD1, BLM, BMPR1A, BRCA1, BRCA2, BRIP1, CDC73, CDH1, CDK4, CDKN1B, CDKN2A, CHEK2, CTNNA1, DICER1, FANCC, FH, FLCN, GALNT12, KIF1B, LZTR1, MAX, MEN1, MET, MLH1, MSH2, MSH3, MSH6, MUTYH, NBN, NF1, NF2, NTHL1, PALB2, PHOX2B, PMS2, POT1,  PRKAR1A, PTCH1, PTEN, RAD51C, RAD51D, RB1, RECQL, RET, SDHA, SDHAF2, SDHB, SDHC, SDHD, SMAD4, SMARCA4, SMARCB1, SMARCE1, STK11, SUFU, TMEM127, TP53, TSC1, TSC2, VHL and XRCC2 (sequencing and deletion/duplication); EGFR, EGLN1, HOXB13, KIT, MITF, PDGFRA, POLD1 and POLE (sequencing only); EPCAM and GREM1 (deletion/duplication only). RNA data is routinely analyzed for use in variant interpretation for all genes.  Based on Eric Lewis's personal and family history of cancer, he meets medical criteria for genetic testing. Despite that he meets criteria, there may still be an out of pocket cost. We discussed that if his out of pocket cost for testing is over $100, the laboratory will reach out to let him know. If the out of pocket cost of testing is less than $100 he will be billed by the genetic testing laboratory.   PLAN: After considering the risks, benefits, and limitations, Eric Lewis provided informed consent to pursue genetic testing and the blood sample was sent to [facility] for analysis of the CancerNext-Expanded + RNAinsight panel. Results should be available within approximately two-three weeks' time, at which point they will be disclosed by telephone to Eric Lewis, as will any additional recommendations warranted by these results. Eric Lewis will receive a summary of his genetic counseling visit and a copy of  his results once available. This information will also be available in Epic.   Eric Lewis questions were answered to his satisfaction today. Our contact information was provided should additional questions or concerns arise. Thank you for the referral and allowing [place] to share in the care of your patient.   Clint Guy, [place], [facility] Licensed, Certified Dispensing [email or web address] Phone: [phone]  The patient was seen for a total of 25 minutes in face-to-face genetic counseling.  This patient was discussed with Drs. Magrinat, Lindi Adie and/or Burr Medico who  agrees with the above.    _______________________________________________________________________ For Office Staff:  Number of people involved in session: 1 Was an Intern/ student involved with case: no

## 2021-03-08 NOTE — Telephone Encounter (Signed)
I called the patient to let him know we'd reviewed his case and that given the nodal involvement, he would benefit from total neoadjuvant chemotherapy followed by chemoRT. He met with Dr. Dema Severin today as well and he recommends that his surgery be at an academic facility given the nodal involvement in the pelvic side wall after completion of chemoRT. He meets with Dr. Benay Spice on Friday and we will plan to follow up with him in about 2 1/2 months to make more definitive plans for chemoRT.

## 2021-03-11 ENCOUNTER — Other Ambulatory Visit: Payer: Self-pay

## 2021-03-11 ENCOUNTER — Inpatient Hospital Stay: Payer: BC Managed Care – PPO | Attending: Oncology | Admitting: Oncology

## 2021-03-11 VITALS — BP 134/90 | HR 85 | Temp 98.1°F | Resp 18 | Ht 68.0 in | Wt 249.0 lb

## 2021-03-11 DIAGNOSIS — C20 Malignant neoplasm of rectum: Secondary | ICD-10-CM | POA: Diagnosis not present

## 2021-03-11 DIAGNOSIS — Z5111 Encounter for antineoplastic chemotherapy: Secondary | ICD-10-CM | POA: Insufficient documentation

## 2021-03-11 DIAGNOSIS — Z452 Encounter for adjustment and management of vascular access device: Secondary | ICD-10-CM | POA: Insufficient documentation

## 2021-03-11 DIAGNOSIS — E119 Type 2 diabetes mellitus without complications: Secondary | ICD-10-CM

## 2021-03-11 DIAGNOSIS — Z8616 Personal history of COVID-19: Secondary | ICD-10-CM | POA: Insufficient documentation

## 2021-03-11 NOTE — Progress Notes (Signed)
[facility] OFFICE PROGRESS NOTE   Diagnosis: Rectal cancer  INTERVAL HISTORY:   Eric Lewis returns as scheduled.  No complaint.  He saw Dr. Dema Severin earlier this week.  He referred Eric Lewis to Dr. Jonni Sanger due to the probable iliac lymph node involvement.  He is scheduled for an appointment at [facility] on 03/16/2021.  A staging MRI of the pelvis on 03/02/2021 revealed mild irregularity at the left anterolateral rectal wall without a clearly visible tumor.  No signs of extension beyond the muscularis propria.  A 7 mm left mesorectal node and 3 less than 5 mm nodes along the left anterolateral rectum.  An additional 5 mm node is adjacent to the 7 mm node.  A lymph node just behind the mesorectum measures 7 mm.  The tumor was staged as having N1 disease, but there is a mildly suspicious lymph node beyond the mesorectum at the left hypogastric distribution.  Objective:  Vital signs in last 24 hours:  Blood pressure 134/90, pulse 85, temperature 98.1 F (36.7 C), temperature source Oral, resp. rate 18, height 5\' 8"  (1.727 m), weight 249 lb (112.9 kg), SpO2 97 %.   Physical examination-not performed today  Lab Results:  Lab Results  Component Value Date   WBC 15.3 (H) 05/30/2016   HGB 14.5 05/30/2016   HCT 44.6 05/30/2016   MCV 86.4 05/30/2016   PLT 331 05/30/2016    CMP  Lab Results  Component Value Date   NA 135 05/30/2016   K 4.0 05/30/2016   CL 99 (L) 05/30/2016   CO2 25 05/30/2016   GLUCOSE 193 (H) 05/30/2016   BUN 6 05/30/2016   CREATININE 1.03 05/30/2016   CALCIUM 9.1 05/30/2016   PROT 7.4 05/30/2016   ALBUMIN 3.6 05/30/2016   AST 19 05/30/2016   ALT 45 05/30/2016   ALKPHOS 104 05/30/2016   BILITOT 1.6 (H) 05/30/2016   GFRNONAA >60 05/30/2016   GFRAA >60 05/30/2016    No results found for: CEA1  No results found for: INR  Imaging:  No results found.  Medications: I have reviewed the patient's current medications.   Assessment/Plan: 1. Rectal  cancer  Colonoscopy 02/15/2021- nonobstructing mass at 2-3 cm in the anal verge, removed in a piecemeal fashion, pathology confirmed moderately differentiated adenocarcinoma  CTs chest, abdomen, and pelvis on 02/17/2021- no evidence of metastatic disease, no clear mass identified, areas of mild rectal wall thickening  MRI 03/02/2021- rectal tumor not identified, no extension beyond the muscularis identified.  In 1 disease with multiple left mesorectal nodes and a 7 mm node beyond the mesorectum at the hypogastric region  2. History of dysphagia-status post a barium swallow 10/22/2020- mild smooth stricture at the GE junction 3. Psoriatic arthritis 4. Diabetes 5. COVID-19 January 22    Disposition: Eric Lewis has been diagnosed with rectal cancer.  He has clinical stage III disease based on the staging MRI.  No remaining rectal tumor was clearly identified on the staging MRI.  I reviewed the MRI images with him.  We discussed treatment options.  His case will be presented at the GI tumor conference on 03/16/2021.  Dr. Dema Severin recommends proceeding with total neoadjuvant therapy.  He has been referred to [facility]. Ronita Hipps at [facility] for surgery.  I recommend proceeding with neoadjuvant FOLFOX to be followed by capecitabine/radiation if Dr. Ronita Hipps agrees. We reviewed potential toxicities associated with the FOLFOX regimen including the chance of nausea/vomiting, mucositis, diarrhea, alopecia, and hematologic toxicity.  We reviewed the sun  sensitivity, rash, hyperpigmentation, and hand/foot syndrome seen with 5-fluorouracil.  We discussed the allergic reaction and various types of neuropathy associated with oxaliplatin.  He agrees to proceed.  Eric Lewis will be referred for Port-A-Cath placement with the plan to begin FOLFOX on 03/22/2021.  A chemotherapy plan was entered today.  Betsy Coder, MD  03/11/2021  11:23 AM

## 2021-03-11 NOTE — Progress Notes (Signed)
START ON PATHWAY REGIMEN - Colorectal     A cycle is every 14 days:     Oxaliplatin      Leucovorin      Fluorouracil      Fluorouracil   **Always confirm dose/schedule in your pharmacy ordering system**  Patient Characteristics: Preoperative or Nonsurgical Candidate (Clinical Staging), Rectal, cT3 - cT4, cN0 or Any cT, cN+ Tumor Location: Rectal Therapeutic Status: Preoperative or Nonsurgical Candidate (Clinical Staging) AJCC T Category: cTX AJCC N Category: cN1 AJCC M Category: cM0 AJCC 8 Stage Grouping: Unknown Intent of Therapy: Curative Intent, Discussed with Patient

## 2021-03-14 ENCOUNTER — Other Ambulatory Visit: Payer: Self-pay

## 2021-03-14 ENCOUNTER — Inpatient Hospital Stay: Payer: BC Managed Care – PPO

## 2021-03-14 ENCOUNTER — Inpatient Hospital Stay: Payer: BC Managed Care – PPO | Admitting: Oncology

## 2021-03-14 MED ORDER — ONDANSETRON HCL 8 MG PO TABS: 8.0000 mg | ORAL_TABLET | Freq: Three times a day (TID) | ORAL | 1 refills | Status: DC | PRN

## 2021-03-14 MED ORDER — LIDOCAINE-PRILOCAINE 2.5-2.5 % EX CREA: 1.0000 "application " | TOPICAL_CREAM | CUTANEOUS | 2 refills | Status: DC

## 2021-03-14 MED ORDER — PROCHLORPERAZINE MALEATE 10 MG PO TABS: 10.0000 mg | ORAL_TABLET | Freq: Four times a day (QID) | ORAL | 1 refills | Status: DC | PRN

## 2021-03-14 NOTE — Progress Notes (Signed)
Chemo education session completed and home anti-emetics and EMLA escribed to pharmacy. Port will be placed on 5/13 with treatment start date 5/17.

## 2021-03-15 ENCOUNTER — Other Ambulatory Visit (HOSPITAL_COMMUNITY)
Admission: RE | Admit: 2021-03-15 | Discharge: 2021-03-15 | Disposition: A | Discharge status: Home / Self Care | Payer: BC Managed Care – PPO | Source: Ambulatory Visit | Attending: Surgery | Admitting: Surgery

## 2021-03-15 DIAGNOSIS — K635 Polyp of colon: Secondary | ICD-10-CM | POA: Diagnosis not present

## 2021-03-15 DIAGNOSIS — Z8616 Personal history of COVID-19: Secondary | ICD-10-CM | POA: Diagnosis not present

## 2021-03-15 DIAGNOSIS — E669 Obesity, unspecified: Secondary | ICD-10-CM | POA: Diagnosis not present

## 2021-03-15 DIAGNOSIS — C20 Malignant neoplasm of rectum: Secondary | ICD-10-CM | POA: Diagnosis not present

## 2021-03-15 DIAGNOSIS — D122 Benign neoplasm of ascending colon: Secondary | ICD-10-CM | POA: Diagnosis not present

## 2021-03-15 DIAGNOSIS — Z7984 Long term (current) use of oral hypoglycemic drugs: Secondary | ICD-10-CM | POA: Diagnosis not present

## 2021-03-15 DIAGNOSIS — Z79899 Other long term (current) drug therapy: Secondary | ICD-10-CM | POA: Diagnosis not present

## 2021-03-15 DIAGNOSIS — E119 Type 2 diabetes mellitus without complications: Secondary | ICD-10-CM | POA: Diagnosis not present

## 2021-03-15 DIAGNOSIS — Z20822 Contact with and (suspected) exposure to covid-19: Secondary | ICD-10-CM | POA: Insufficient documentation

## 2021-03-15 DIAGNOSIS — Z01812 Encounter for preprocedural laboratory examination: Secondary | ICD-10-CM | POA: Insufficient documentation

## 2021-03-15 DIAGNOSIS — Z6838 Body mass index (BMI) 38.0-38.9, adult: Secondary | ICD-10-CM | POA: Diagnosis not present

## 2021-03-15 DIAGNOSIS — E785 Hyperlipidemia, unspecified: Secondary | ICD-10-CM | POA: Diagnosis not present

## 2021-03-15 LAB — SARS CORONAVIRUS 2 (TAT 6-24 HRS): SARS Coronavirus 2: NEGATIVE

## 2021-03-15 NOTE — Progress Notes (Signed)
Spoke with dr Zenon Mayo [facility] and pt must have driver/caregiver dos, called t [facility] triage and made aware pt needs driver/caregiver dos .

## 2021-03-16 ENCOUNTER — Encounter (HOSPITAL_BASED_OUTPATIENT_CLINIC_OR_DEPARTMENT_OTHER): Payer: Self-pay | Admitting: Surgery

## 2021-03-16 ENCOUNTER — Other Ambulatory Visit: Payer: Self-pay

## 2021-03-16 ENCOUNTER — Ambulatory Visit: Payer: Self-pay | Admitting: Surgery

## 2021-03-16 DIAGNOSIS — C20 Malignant neoplasm of rectum: Secondary | ICD-10-CM | POA: Diagnosis not present

## 2021-03-16 NOTE — Progress Notes (Signed)
Spoke with Eric Lewis at [facility] and pt has overnight caregiver.

## 2021-03-16 NOTE — Progress Notes (Signed)
The proposed treatment discussed in conference is for discussion purposes only and is not a binding recommendation.  The patients have not been physically examined, or presented with their treatment options.  Therefore, final treatment plans cannot be decided.   

## 2021-03-16 NOTE — H&P (View-Only) (Signed)
CC: Referred by Dr. Sherrill/Eric Lewis for newly diagnosed distal rectal adenocarcinoma  HPI: [name] is a very pleasant 50yoM with hx of DM, HLD, obesity, presents to office for evaluation of newly diagnosed rectal cancer. He reports hx of occasional BRBPR in months preceeding his colonoscopy. He underwent scope with Dr. Karki. He is found to have a "polyp" in the rectum that was removed. This was centrally umbilicated by the endoscopic photo. Pathology returned with invasive moderately differentiated adenocarcinoma. He was referred to medical oncology and our office for further evaluation. He had a staging CT chest/abdomen/pelvis 02/17/21 demonstrated no evidence of metastatic disease. He is scheduled for a pelvic MRI later today.  He denies any pelvic/anal pain or rectal bleeding.  INTERVAL HX He underwent pelvic MRI 03/03/21 that demonstrated no evident extension beyond the muscularis of the rectal lesion but they also do not readily identified tumor in the vicinity of his prior endoscopic resection [id number]. Left knee rectal lymph node 7 mm in size. Additional extra-mesorectal lymphadenopathy noted along the left internal iliac nodal basin. This had similar signal characteristic to the suspicious lymph nodes in the mesorectum as well. He has met with both medical and radiation oncology with ultimate treatment plans pending. He denies any complaints today.    PMH: DM, HLD  PSH: He denies any prior abdominal or pelvic surgical history. Prior right knee surgery.  FHx: Denies FHx of colorectal, endometrial, ovarian or cervical cancer. His maternal grandmother had breast cancer.  Social: Denies use of tobacco/EtOH/drugs. 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  ROS: A comprehensive 10 system review of systems was completed with the patient and pertinent findings as noted above.  The patient is a 50 year old male.   Allergies (Eric Alston, CNA; 03/08/2021 9:40 AM) No Known  Allergies  [03/02/2021]: No Known Drug Allergies  [03/08/2021]: Allergies Reconciled   Medication History (Eric Alston, CNA; 03/08/2021 9:40 AM) Farxiga (5MG Tablet, Oral) Active. FreeStyle Libre 14 Day Sensor Active. Simvastatin (40MG Tablet, Oral) Active. metFORMIN HCl (500MG Tablet, Oral) Active. Otezla (30MG Tablet, Oral) Active. Claritin (10MG Tablet, Oral) Active. Medications Reconciled    Review of Systems (Eric Mancillas M. Kaloni Bisaillon MD; 03/08/2021 10:32 AM) General Not Present- Appetite Loss, Chills, Fatigue, Fever, Night Sweats, Weight Gain and Weight Loss. Skin Not Present- Change in Wart/Mole, Dryness, Hives, Jaundice, New Lesions, Non-Healing Wounds, Rash and Ulcer. HEENT Not Present- Earache, Hearing Loss, Hoarseness, Nose Bleed, Oral Ulcers, Ringing in the Ears, Seasonal Allergies, Sinus Pain, Sore Throat, Visual Disturbances, Wears glasses/contact lenses and Yellow Eyes. Respiratory Present- Snoring. Not Present- Bloody sputum, Chronic Cough, Difficulty Breathing and Wheezing. Breast Not Present- Breast Mass, Breast Pain, Nipple Discharge and Skin Changes. Cardiovascular Not Present- Chest Pain, Difficulty Breathing Lying Down, Leg Cramps, Palpitations, Rapid Heart Rate, Shortness of Breath and Swelling of Extremities. Gastrointestinal Not Present- Abdominal Pain, Bloating, Bloody Stool, Change in Bowel Habits, Chronic diarrhea, Constipation, Difficulty Swallowing, Excessive gas, Gets full quickly at meals, Hemorrhoids, Indigestion, Nausea, Rectal Pain and Vomiting. Male Genitourinary Not Present- Blood in Urine, Change in Urinary Stream, Frequency, Impotence, Nocturia, Painful Urination, Urgency and Urine Leakage. Musculoskeletal Not Present- Back Pain, Joint Pain, Joint Stiffness, Muscle Pain, Muscle Weakness and Swelling of Extremities. Neurological Not Present- Decreased Memory, Fainting, Headaches, Numbness, Seizures, Tingling, Tremor, Trouble walking and  Weakness. Psychiatric Not Present- Anxiety, Bipolar, Change in Sleep Pattern, Depression, Fearful and Frequent crying. Endocrine Not Present- Cold Intolerance, Excessive Hunger, Hair Changes, Heat Intolerance and New Diabetes. Hematology Not Present- Blood Thinners, Easy Bruising,   Excessive bleeding, Gland problems, HIV and Persistent Infections.   Physical Exam (Eric Castello M. Ona Roehrs MD; 03/08/2021 10:33 AM) The physical exam findings are as follows: Note: Constitutional: No acute distress; conversant; wearing mask Eyes: Moist conjunctiva; no lid lag; anicteric sclerae; pupils equal round and reactive to light Lungs: Normal respiratory effort CV: rrr; no pitting edema GI: Abdomen obese soft, nontender, nondistended; no palpable hepatosplenomegaly. ~4 cm soft reducible umbilical hernia. Large diastasis recti. (Historical exam Anorectal: Normal perianal skin. DRE - palpable firm but mobile nodularity left anterolateral ~3 cm from anal verge) MSK: Normal gait Psychiatric: Appropriate affect; alert and oriented 3  **A chaperone, Eric Lewis, was present for this encounter    Assessment & Plan (Eric Snowden M. Javanni Maring MD; 03/08/2021 10:36 AM) RECTAL CANCER ([facility]) Story: [name] is a very pleasant 50yoM with hx of HLD, DM here for evaluation of newly diagnosed distal rectal cancer CT CAP 02/17/21 - no evidence of metastatic disease MRI Pelvis 03/03/21 - cmriT?N1M0 -with extramesorectal adenopathy found on left internal iliac chain Impression: -We spent time today reviewing the relevant anatomy and physiology as well as pathophysiology of rectal cancer. We discussed that a lot of decision-making is pending completion of his MRI. There is no comments made in the pathology report regarding the cauterized margin of this polypoid mass that was removed. Our path department reviewed slides and agreed with read but were not able to define the T stage of the lesion -MRI suggests this to be a stage III  rectal cancer with both mesorectal and extra-mesorectal disease along L int iliac chain. -Case is being re-presented at multidisciplinary tumor board -Given the concerns for the lateral compartment being involved, we discussed ultimate surgical referral to [facility] for potential lateral pelvic LN dissection concurrently. -Medical and radiation oncology follow-up planned later this week locally  Signed by Eric Hirota M Shakiah Wester, MD (03/08/2021 10:37 AM) 

## 2021-03-16 NOTE — H&P (Signed)
CC: Referred by Dr. Candelaria Stagers for newly diagnosed distal rectal adenocarcinoma  HPI: [name] is a very pleasant 51yoM with hx of DM, HLD, obesity, presents to office for evaluation of newly diagnosed rectal cancer. He reports hx of occasional BRBPR in months preceeding his colonoscopy. He underwent scope with Dr. Therisa Doyne. He is found to have a "polyp" in the rectum that was removed. This was centrally umbilicated by the endoscopic photo. Pathology returned with invasive moderately differentiated adenocarcinoma. He was referred to medical oncology and our office for further evaluation. He had a staging CT chest/abdomen/pelvis 02/17/21 demonstrated no evidence of metastatic disease. He is scheduled for a pelvic MRI later today.  He denies any pelvic/anal pain or rectal bleeding.  INTERVAL HX He underwent pelvic MRI 03/03/21 that demonstrated no evident extension beyond the muscularis of the rectal lesion but they also do not readily identified tumor in the vicinity of his prior endoscopic resection [id number]. Left knee rectal lymph node 7 mm in size. Additional extra-mesorectal lymphadenopathy noted along the left internal iliac nodal basin. This had similar signal characteristic to the suspicious lymph nodes in the mesorectum as well. He has met with both medical and radiation oncology with ultimate treatment plans pending. He denies any complaints today.    PMH: DM, HLD  PSH: He denies any prior abdominal or pelvic surgical history. Prior right knee surgery.  FHx: Denies FHx of colorectal, endometrial, ovarian or cervical cancer. His maternal grandmother had breast cancer.  Social: Denies use of tobacco/EtOH/drugs. 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  ROS: A comprehensive 10 system review of systems was completed with the patient and pertinent findings as noted above.  The patient is a 50 year old male.   Allergies Janeann Forehand, CNA; 03/08/2021 9:40 AM) No Known  Allergies  [03/02/2021]: No Known Drug Allergies  [03/08/2021]: Allergies Reconciled   Medication History Janeann Forehand, CNA; 03/08/2021 9:40 AM) Wilder Glade (5MG Tablet, Oral) Active. FreeStyle Libre 14 Day Sensor Active. Simvastatin (40MG Tablet, Oral) Active. metFORMIN HCl (500MG Tablet, Oral) Active. Otezla (30MG Tablet, Oral) Active. Claritin (10MG Tablet, Oral) Active. Medications Reconciled    Review of Systems Harrell Gave M. Seira Cody MD; 03/08/2021 10:32 AM) General Not Present- Appetite Loss, Chills, Fatigue, Fever, Night Sweats, Weight Gain and Weight Loss. Skin Not Present- Change in Wart/Mole, Dryness, Hives, Jaundice, New Lesions, Non-Healing Wounds, Rash and Ulcer. HEENT Not Present- Earache, Hearing Loss, Hoarseness, Nose Bleed, Oral Ulcers, Ringing in the Ears, Seasonal Allergies, Sinus Pain, Sore Throat, Visual Disturbances, Wears glasses/contact lenses and Yellow Eyes. Respiratory Present- Snoring. Not Present- Bloody sputum, Chronic Cough, Difficulty Breathing and Wheezing. Breast Not Present- Breast Mass, Breast Pain, Nipple Discharge and Skin Changes. Cardiovascular Not Present- Chest Pain, Difficulty Breathing Lying Down, Leg Cramps, Palpitations, Rapid Heart Rate, Shortness of Breath and Swelling of Extremities. Gastrointestinal Not Present- Abdominal Pain, Bloating, Bloody Stool, Change in Bowel Habits, Chronic diarrhea, Constipation, Difficulty Swallowing, Excessive gas, Gets full quickly at meals, Hemorrhoids, Indigestion, Nausea, Rectal Pain and Vomiting. Male Genitourinary Not Present- Blood in Urine, Change in Urinary Stream, Frequency, Impotence, Nocturia, Painful Urination, Urgency and Urine Leakage. Musculoskeletal Not Present- Back Pain, Joint Pain, Joint Stiffness, Muscle Pain, Muscle Weakness and Swelling of Extremities. Neurological Not Present- Decreased Memory, Fainting, Headaches, Numbness, Seizures, Tingling, Tremor, Trouble walking and  Weakness. Psychiatric Not Present- Anxiety, Bipolar, Change in Sleep Pattern, Depression, Fearful and Frequent crying. Endocrine Not Present- Cold Intolerance, Excessive Hunger, Hair Changes, Heat Intolerance and New Diabetes. Hematology Not Present- Blood Thinners, Easy Bruising,  Excessive bleeding, Gland problems, HIV and Persistent Infections.   Physical Exam Harrell Gave M. Cesily Cuoco MD; 03/08/2021 10:33 AM) The physical exam findings are as follows: Note: Constitutional: No acute distress; conversant; wearing mask Eyes: Moist conjunctiva; no lid lag; anicteric sclerae; pupils equal round and reactive to light Lungs: Normal respiratory effort CV: rrr; no pitting edema GI: Abdomen obese soft, nontender, nondistended; no palpable hepatosplenomegaly. ~4 cm soft reducible umbilical hernia. Large diastasis recti. (Historical exam Anorectal: Normal perianal skin. DRE - palpable firm but mobile nodularity left anterolateral ~3 cm from anal verge) MSK: Normal gait Psychiatric: Appropriate affect; alert and oriented 3  **A chaperone, Altamese Cabal, was present for this encounter    Assessment & Plan Harrell Gave M. Zierra Laroque MD; 03/08/2021 10:36 AM) RECTAL CANCER (C20) Story: Mr. Brentlinger is a very pleasant 18yoM with hx of HLD, DM here for evaluation of newly diagnosed distal rectal cancer CT CAP 02/17/21 - no evidence of metastatic disease MRI Pelvis 03/03/21 - cmriT?N1M0 -with extramesorectal adenopathy found on left internal iliac chain Impression: -We spent time today reviewing the relevant anatomy and physiology as well as pathophysiology of rectal cancer. We discussed that a lot of decision-making is pending completion of his MRI. There is no comments made in the pathology report regarding the cauterized margin of this polypoid mass that was removed. Our path department reviewed slides and agreed with read but were not able to define the T stage of the lesion -MRI suggests this to be a stage III  rectal cancer with both mesorectal and extra-mesorectal disease along L int iliac chain. -Case is being re-presented at multidisciplinary tumor board -Given the concerns for the lateral compartment being involved, we discussed ultimate surgical referral to [facility] for potential lateral pelvic LN dissection concurrently. -Medical and radiation oncology follow-up planned later this week locally  Signed by Ileana Roup, MD (03/08/2021 10:37 AM)

## 2021-03-16 NOTE — Progress Notes (Addendum)
Spoke w/ via phone for pre-op interview---pt Lab needs dos----     I stat ekg   (per anethesia) surgery orders pending       Lab results------chest ct 02-17-2021  epic COVID test ------03-15-2021 negative result epic Arrive at -------845 am 03-18-2021 NPO after MN NO Solid Food.  Clear liquids from MN until---745 am then npo Med rec completed Medications to take morning of surgery -----loratadine Diabetic medication -----none day of surgery Patient instructed to bring photo id and insurance card day of surgery Patient aware to have Driver (ride ) / caregiver  Driver/caregiver night of surgery martha will drop pt off   for 24 hours after surgery  Patient Special Instructions -----none Pre-Op special Istructions -----surgery orders requested dr white epic ib Patient verbalized understanding of instructions that were given at this phone interview. Patient denies shortness of breath, chest pain, fever, cough at this phone interview.

## 2021-03-18 ENCOUNTER — Other Ambulatory Visit: Payer: Self-pay

## 2021-03-18 ENCOUNTER — Encounter (HOSPITAL_BASED_OUTPATIENT_CLINIC_OR_DEPARTMENT_OTHER)
Admission: RE | Disposition: A | Discharge status: Home / Self Care | Payer: Self-pay | Source: Home / Self Care | Attending: Surgery

## 2021-03-18 ENCOUNTER — Encounter (HOSPITAL_BASED_OUTPATIENT_CLINIC_OR_DEPARTMENT_OTHER): Payer: Self-pay | Admitting: Surgery

## 2021-03-18 ENCOUNTER — Ambulatory Visit (HOSPITAL_BASED_OUTPATIENT_CLINIC_OR_DEPARTMENT_OTHER)
Admission: RE | Admit: 2021-03-18 | Discharge: 2021-03-18 | Disposition: A | Discharge status: Home / Self Care | Payer: BC Managed Care – PPO | Attending: Surgery | Admitting: Surgery

## 2021-03-18 ENCOUNTER — Ambulatory Visit (HOSPITAL_COMMUNITY): Payer: BC Managed Care – PPO

## 2021-03-18 ENCOUNTER — Ambulatory Visit (HOSPITAL_BASED_OUTPATIENT_CLINIC_OR_DEPARTMENT_OTHER): Payer: BC Managed Care – PPO | Admitting: Anesthesiology

## 2021-03-18 DIAGNOSIS — Z452 Encounter for adjustment and management of vascular access device: Secondary | ICD-10-CM | POA: Diagnosis not present

## 2021-03-18 DIAGNOSIS — Z20822 Contact with and (suspected) exposure to covid-19: Secondary | ICD-10-CM | POA: Insufficient documentation

## 2021-03-18 DIAGNOSIS — Z79899 Other long term (current) drug therapy: Secondary | ICD-10-CM | POA: Insufficient documentation

## 2021-03-18 DIAGNOSIS — C20 Malignant neoplasm of rectum: Secondary | ICD-10-CM | POA: Insufficient documentation

## 2021-03-18 DIAGNOSIS — Z7984 Long term (current) use of oral hypoglycemic drugs: Secondary | ICD-10-CM | POA: Diagnosis not present

## 2021-03-18 DIAGNOSIS — E785 Hyperlipidemia, unspecified: Secondary | ICD-10-CM | POA: Diagnosis not present

## 2021-03-18 DIAGNOSIS — E119 Type 2 diabetes mellitus without complications: Secondary | ICD-10-CM | POA: Insufficient documentation

## 2021-03-18 DIAGNOSIS — Z6838 Body mass index (BMI) 38.0-38.9, adult: Secondary | ICD-10-CM | POA: Diagnosis not present

## 2021-03-18 DIAGNOSIS — Z8616 Personal history of COVID-19: Secondary | ICD-10-CM | POA: Insufficient documentation

## 2021-03-18 DIAGNOSIS — Z95828 Presence of other vascular implants and grafts: Secondary | ICD-10-CM

## 2021-03-18 DIAGNOSIS — E669 Obesity, unspecified: Secondary | ICD-10-CM | POA: Diagnosis not present

## 2021-03-18 DIAGNOSIS — E7800 Pure hypercholesterolemia, unspecified: Secondary | ICD-10-CM | POA: Diagnosis not present

## 2021-03-18 HISTORY — DX: Type 2 diabetes mellitus without complications: E11.9

## 2021-03-18 HISTORY — DX: COVID-19: U07.1

## 2021-03-18 HISTORY — DX: Umbilical hernia without obstruction or gangrene: K42.9

## 2021-03-18 HISTORY — DX: Malignant neoplasm of rectum: C20

## 2021-03-18 HISTORY — DX: Personal history of other diseases of the circulatory system: Z86.79

## 2021-03-18 HISTORY — PX: PORTACATH PLACEMENT: SHX2246

## 2021-03-18 LAB — POCT I-STAT, CHEM 8
BUN: 20 mg/dL (ref 6–20)
Calcium, Ion: 1.07 mmol/L — ABNORMAL LOW (ref 1.15–1.40)
Chloride: 104 mmol/L (ref 98–111)
Creatinine, Ser: 0.8 mg/dL (ref 0.61–1.24)
Glucose, Bld: 137 mg/dL — ABNORMAL HIGH (ref 70–99)
HCT: 50 % (ref 39.0–52.0)
Hemoglobin: 17 g/dL (ref 13.0–17.0)
Potassium: 5.1 mmol/L (ref 3.5–5.1)
Sodium: 136 mmol/L (ref 135–145)
TCO2: 24 mmol/L (ref 22–32)

## 2021-03-18 LAB — GLUCOSE, CAPILLARY: Glucose-Capillary: 129 mg/dL — ABNORMAL HIGH (ref 70–99)

## 2021-03-18 IMAGING — DX DG CHEST 1V PORT
1 series · 1 of 1 positions shown · non-contrast
Comparison: [DATE]

CLINICAL DATA: Status post Port-A-Cath placement.

EXAM:
PORTABLE CHEST 1 VIEW

[chest ap]
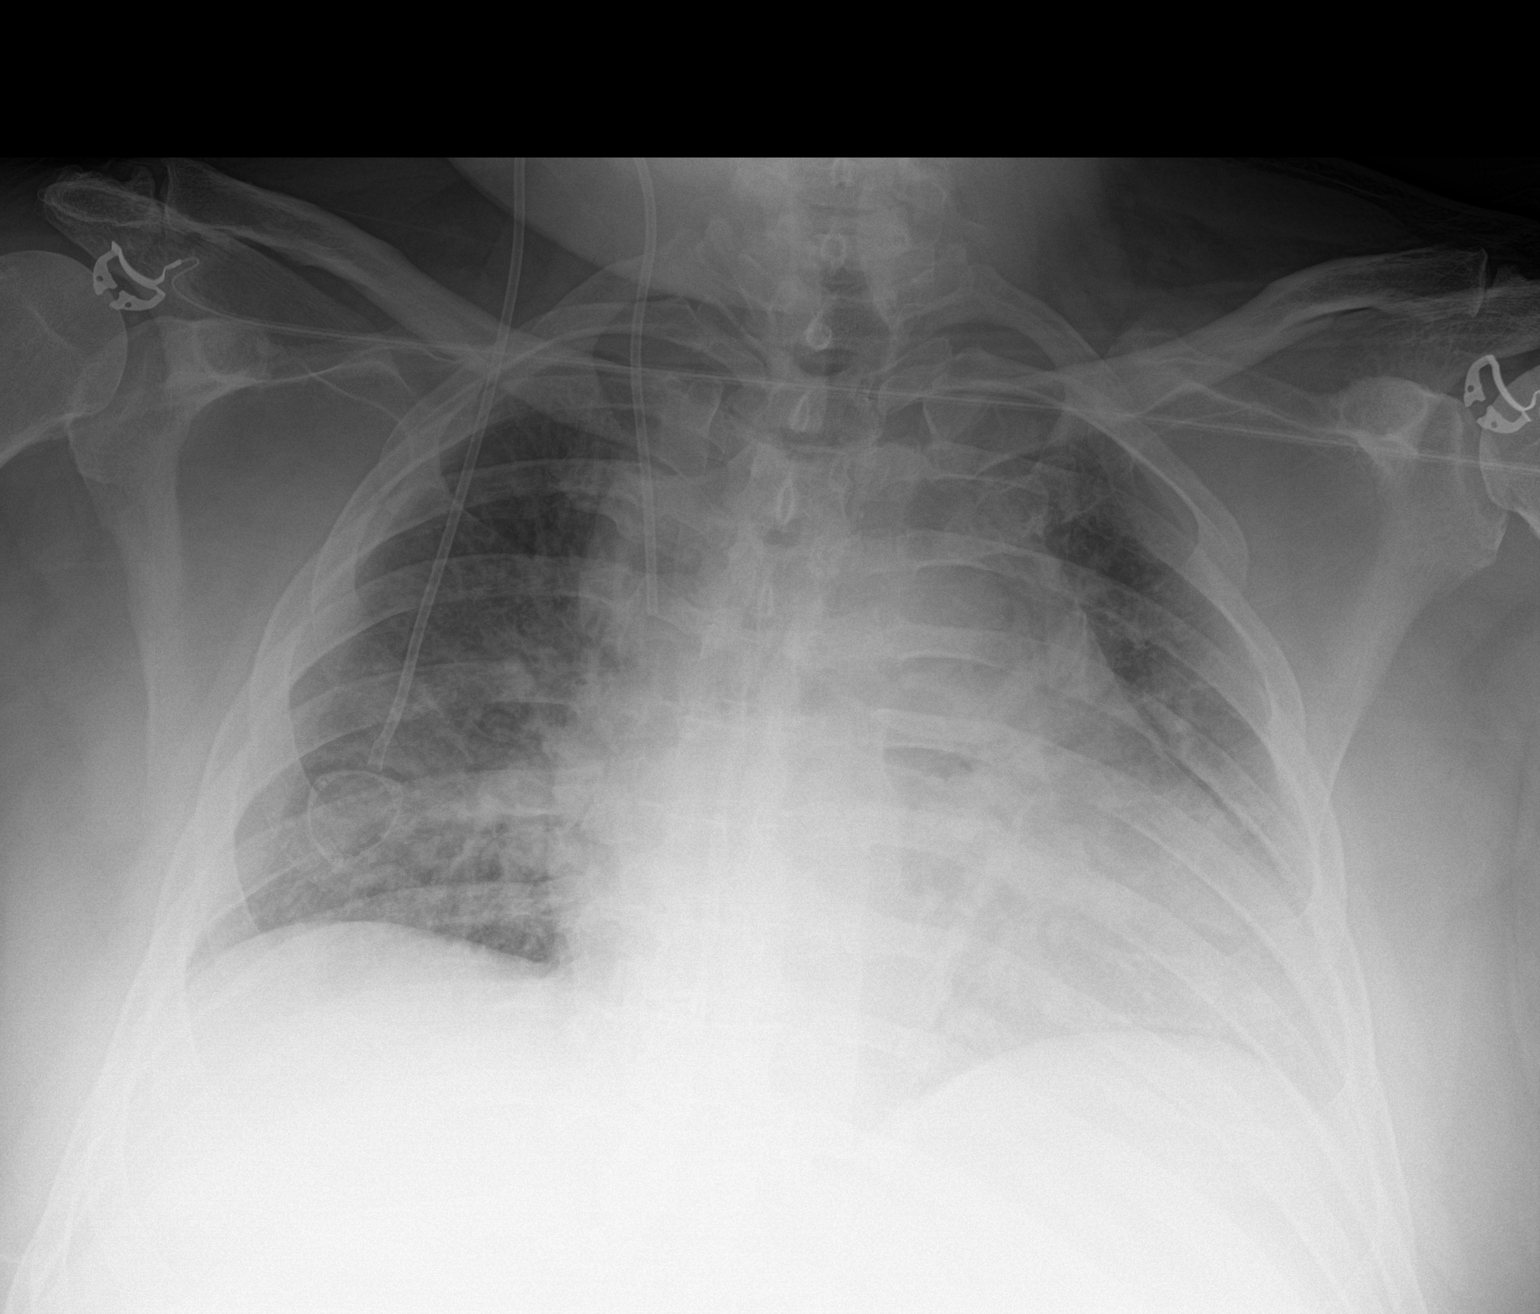

[1 of 1 positions shown; findings below may reference images not displayed]

FINDINGS: Interval placement of RIGHT-sided PowerPort, tip overlying the level
of the superior vena cava. Heart size is accentuated by portable
technique. No evidence for pneumothorax. No pulmonary edema.
IMPRESSION: Interval placement of RIGHT-sided PowerPort, tip overlying the
superior vena cava.

## 2021-03-18 SURGERY — INSERTION, TUNNELED CENTRAL VENOUS DEVICE, WITH PORT
Anesthesia: General | Laterality: Right

## 2021-03-18 MED ORDER — LIDOCAINE-EPINEPHRINE 1 %-1:100000 IJ SOLN
INTRAMUSCULAR | Status: DC | PRN
  Administered 2021-03-18: 10 mL

## 2021-03-18 MED ORDER — PROMETHAZINE HCL 25 MG/ML IJ SOLN: 6.2500 mg | INTRAMUSCULAR | Status: DC | PRN

## 2021-03-18 MED ORDER — CEFAZOLIN IN SODIUM CHLORIDE 3-0.9 GM/100ML-% IV SOLN
3.0000 g | INTRAVENOUS | Status: AC
  Administered 2021-03-18: 3 g via INTRAVENOUS

## 2021-03-18 MED ORDER — TRAMADOL HCL 50 MG PO TABS: 50.0000 mg | ORAL_TABLET | Freq: Four times a day (QID) | ORAL | 0 refills | Status: AC | PRN

## 2021-03-18 MED ORDER — MIDAZOLAM HCL 2 MG/2ML IJ SOLN
INTRAMUSCULAR | Status: DC | PRN
  Administered 2021-03-18: 2 mg via INTRAVENOUS

## 2021-03-18 MED ORDER — ONDANSETRON HCL 4 MG/2ML IJ SOLN
INTRAMUSCULAR | Status: DC | PRN
  Administered 2021-03-18: 4 mg via INTRAVENOUS

## 2021-03-18 MED ORDER — CHLORHEXIDINE GLUCONATE CLOTH 2 % EX PADS: 6.0000 | MEDICATED_PAD | Freq: Once | CUTANEOUS | Status: DC

## 2021-03-18 MED ORDER — HEPARIN SOD (PORK) LOCK FLUSH 100 UNIT/ML IV SOLN
INTRAVENOUS | Status: DC | PRN
  Administered 2021-03-18: 300 [IU]

## 2021-03-18 MED ORDER — SODIUM CHLORIDE 0.9 % IV SOLN
Freq: Once | INTRAVENOUS | Status: DC
  Filled 2021-03-18: qty 1.2

## 2021-03-18 MED ORDER — HYDROMORPHONE HCL 1 MG/ML IJ SOLN: 0.2500 mg | INTRAMUSCULAR | Status: DC | PRN

## 2021-03-18 MED ORDER — PHENYLEPHRINE HCL (PRESSORS) 10 MG/ML IV SOLN
INTRAVENOUS | Status: DC | PRN
  Administered 2021-03-18: 80 ug via INTRAVENOUS

## 2021-03-18 MED ORDER — FENTANYL CITRATE (PF) 100 MCG/2ML IJ SOLN
INTRAMUSCULAR | Status: AC
  Filled 2021-03-18: qty 2

## 2021-03-18 MED ORDER — CEFAZOLIN IN SODIUM CHLORIDE 3-0.9 GM/100ML-% IV SOLN
INTRAVENOUS | Status: AC
  Filled 2021-03-18: qty 100

## 2021-03-18 MED ORDER — KETOROLAC TROMETHAMINE 30 MG/ML IJ SOLN: 30.0000 mg | Freq: Once | INTRAMUSCULAR | Status: DC | PRN

## 2021-03-18 MED ORDER — SODIUM CHLORIDE 0.9 % IV SOLN
INTRAVENOUS | Status: DC | PRN
  Administered 2021-03-18: 20 mL

## 2021-03-18 MED ORDER — ACETAMINOPHEN 500 MG PO TABS
ORAL_TABLET | ORAL | Status: AC
  Filled 2021-03-18: qty 2

## 2021-03-18 MED ORDER — PROPOFOL 10 MG/ML IV BOLUS
INTRAVENOUS | Status: DC | PRN
  Administered 2021-03-18: 200 mg via INTRAVENOUS
  Administered 2021-03-18: 100 mg via INTRAVENOUS

## 2021-03-18 MED ORDER — MEPERIDINE HCL 25 MG/ML IJ SOLN: 6.2500 mg | INTRAMUSCULAR | Status: DC | PRN

## 2021-03-18 MED ORDER — SODIUM CHLORIDE 0.9 % IV SOLN: INTRAVENOUS | Status: DC | PRN

## 2021-03-18 MED ORDER — DEXAMETHASONE SODIUM PHOSPHATE 10 MG/ML IJ SOLN
INTRAMUSCULAR | Status: DC | PRN
  Administered 2021-03-18: 5 mg via INTRAVENOUS

## 2021-03-18 MED ORDER — DEXAMETHASONE SODIUM PHOSPHATE 10 MG/ML IJ SOLN
INTRAMUSCULAR | Status: AC
  Filled 2021-03-18: qty 1

## 2021-03-18 MED ORDER — PROPOFOL 10 MG/ML IV BOLUS
INTRAVENOUS | Status: AC
  Filled 2021-03-18: qty 20

## 2021-03-18 MED ORDER — PROMETHAZINE HCL 25 MG/ML IJ SOLN
INTRAMUSCULAR | Status: AC
  Filled 2021-03-18: qty 1

## 2021-03-18 MED ORDER — LIDOCAINE 2% (20 MG/ML) 5 ML SYRINGE
INTRAMUSCULAR | Status: AC
  Filled 2021-03-18: qty 5

## 2021-03-18 MED ORDER — ACETAMINOPHEN 500 MG PO TABS
1000.0000 mg | ORAL_TABLET | ORAL | Status: AC
  Administered 2021-03-18: 1000 mg via ORAL

## 2021-03-18 MED ORDER — OXYCODONE HCL 5 MG PO TABS: 5.0000 mg | ORAL_TABLET | Freq: Once | ORAL | Status: DC | PRN

## 2021-03-18 MED ORDER — LACTATED RINGERS IV SOLN: INTRAVENOUS | Status: DC

## 2021-03-18 MED ORDER — MIDAZOLAM HCL 2 MG/2ML IJ SOLN
INTRAMUSCULAR | Status: AC
  Filled 2021-03-18: qty 2

## 2021-03-18 MED ORDER — OXYCODONE HCL 5 MG/5ML PO SOLN
5.0000 mg | Freq: Once | ORAL | Status: DC | PRN
Start: 2021-03-18 — End: 2021-03-18

## 2021-03-18 MED ORDER — FENTANYL CITRATE (PF) 100 MCG/2ML IJ SOLN
INTRAMUSCULAR | Status: DC | PRN
  Administered 2021-03-18 (×2): 50 ug via INTRAVENOUS

## 2021-03-18 MED ORDER — LIDOCAINE 2% (20 MG/ML) 5 ML SYRINGE
INTRAMUSCULAR | Status: DC | PRN
  Administered 2021-03-18: 60 mg via INTRAVENOUS

## 2021-03-18 MED ORDER — ONDANSETRON HCL 4 MG/2ML IJ SOLN
INTRAMUSCULAR | Status: AC
  Filled 2021-03-18: qty 2

## 2021-03-18 SURGICAL SUPPLY — 43 items
ADH SKN CLS APL DERMABOND .7 (GAUZE/BANDAGES/DRESSINGS) ×1
APL PRP STRL LF DISP 70% ISPRP (MISCELLANEOUS) ×1
APL SKNCLS STERI-STRIP NONHPOA (GAUZE/BANDAGES/DRESSINGS) ×1
BAG DECANTER FOR FLEXI CONT (MISCELLANEOUS) ×2 IMPLANT
BENZOIN TINCTURE PRP APPL 2/3 (GAUZE/BANDAGES/DRESSINGS) ×2 IMPLANT
BLADE SURG 15 STRL LF DISP TIS (BLADE) ×1 IMPLANT
BLADE SURG 15 STRL SS (BLADE) ×2
BLADE SURG SZ11 CARB STEEL (BLADE) ×2 IMPLANT
CHLORAPREP W/TINT 26 (MISCELLANEOUS) ×2 IMPLANT
COVER BACK TABLE 60X90IN (DRAPES) ×2 IMPLANT
COVER MAYO STAND STRL (DRAPES) ×2 IMPLANT
COVER PROBE U/S 5X48 (MISCELLANEOUS) ×2 IMPLANT
COVER WAND RF STERILE (DRAPES) ×1 IMPLANT
DECANTER SPIKE VIAL GLASS SM (MISCELLANEOUS) ×4 IMPLANT
DERMABOND ADVANCED (GAUZE/BANDAGES/DRESSINGS) ×1
DERMABOND ADVANCED .7 DNX12 (GAUZE/BANDAGES/DRESSINGS) ×1 IMPLANT
DRAPE C-ARM 42X120 X-RAY (DRAPES) ×2 IMPLANT
DRAPE LAPAROSCOPIC ABDOMINAL (DRAPES) ×2 IMPLANT
ELECT REM PT RETURN 15FT ADLT (MISCELLANEOUS) ×2 IMPLANT
GAUZE 4X4 16PLY RFD (DISPOSABLE) ×2 IMPLANT
GLOVE SURG ENC TEXT LTX SZ7.5 (GLOVE) ×2 IMPLANT
GLOVE SURG LTX SZ8 (GLOVE) ×2 IMPLANT
GLOVE SURG UNDER LTX SZ8 (GLOVE) ×2 IMPLANT
GLOVE SURG UNDER POLY LF SZ7 (GLOVE) ×2 IMPLANT
GOWN STRL REUS W/TWL LRG LVL3 (GOWN DISPOSABLE) ×1 IMPLANT
GOWN STRL REUS W/TWL XL LVL3 (GOWN DISPOSABLE) ×3 IMPLANT
KIT PORT POWER 8FR ISP CVUE (Port) ×2 IMPLANT
KIT TURNOVER CYSTO (KITS) ×2 IMPLANT
NDL HYPO 25X1 1.5 SAFETY (NEEDLE) ×1 IMPLANT
NEEDLE HYPO 25X1 1.5 SAFETY (NEEDLE) ×2 IMPLANT
NS IRRIG 1000ML POUR BTL (IV SOLUTION) ×2 IMPLANT
PACK BASIN DAY SURGERY FS (CUSTOM PROCEDURE TRAY) ×2 IMPLANT
PENCIL SMOKE EVACUATOR (MISCELLANEOUS) ×1 IMPLANT
SUT MNCRL AB 4-0 PS2 18 (SUTURE) ×2 IMPLANT
SUT PROLENE 2 0 SH DA (SUTURE) ×2 IMPLANT
SUT VIC AB 3-0 SH 27 (SUTURE) ×2
SUT VIC AB 3-0 SH 27XBRD (SUTURE) ×1 IMPLANT
SYR 10ML LL (SYRINGE) ×2 IMPLANT
SYR 20ML LL LF (SYRINGE) ×2 IMPLANT
SYR CONTROL 10ML LL (SYRINGE) ×2 IMPLANT
TOWEL OR 17X26 10 PK STRL BLUE (TOWEL DISPOSABLE) ×2 IMPLANT
TUBE CONNECTING 12X1/4 (SUCTIONS) ×1 IMPLANT
YANKAUER SUCT BULB TIP NO VENT (SUCTIONS) ×1 IMPLANT

## 2021-03-18 NOTE — Anesthesia Preprocedure Evaluation (Addendum)
Anesthesia Evaluation  Patient identified by MRN, date of birth, ID band Patient awake    Reviewed: Allergy & Precautions, NPO status , Patient's Chart, lab work & pertinent test results  Airway Mallampati: III  TM Distance: >3 FB Neck ROM: Full    Dental no notable dental hx. (+) Teeth Intact, Missing, Dental Advisory Given,    Pulmonary  COVID Jan 2022, currently asymptomatic  Snores, has never had sleep study    Pulmonary exam normal breath sounds clear to auscultation       Cardiovascular negative cardio ROS Normal cardiovascular exam Rhythm:Regular Rate:Normal     Neuro/Psych negative neurological ROS  negative psych ROS   GI/Hepatic Neg liver ROS, Rectal ca   Endo/Other  diabetes, Well Controlled, Type 2, Oral Hypoglycemic AgentsObesity BMI 38  Renal/GU negative Renal ROS  negative genitourinary   Musculoskeletal  (+) Arthritis , Osteoarthritis,    Abdominal (+) + obese,   Peds negative pediatric ROS (+)  Hematology negative hematology ROS (+)   Anesthesia Other Findings   Reproductive/Obstetrics negative OB ROS                            Anesthesia Physical Anesthesia Plan  ASA: II  Anesthesia Plan: General   Post-op Pain Management:    Induction: Intravenous  PONV Risk Score and Plan: 2 and Ondansetron, Dexamethasone, Midazolam and Treatment may vary due to age or medical condition  Airway Management Planned: LMA  Additional Equipment: None  Intra-op Plan:   Post-operative Plan: Extubation in OR  Informed Consent: I have reviewed the patients History and Physical, chart, labs and discussed the procedure including the risks, benefits and alternatives for the proposed anesthesia with the patient or authorized representative who has indicated his/her understanding and acceptance.     Dental advisory given  Plan Discussed with: CRNA  Anesthesia Plan Comments:          Anesthesia Quick Evaluation

## 2021-03-18 NOTE — Anesthesia Procedure Notes (Signed)
Procedure Name: LMA Insertion Date/Time: 03/18/2021 10:44 AM Performed by: Mechele Claude, CRNA Pre-anesthesia Checklist: Patient identified, Emergency Drugs available, Suction available and Patient being monitored Patient Re-evaluated:Patient Re-evaluated prior to induction Oxygen Delivery Method: Circle system utilized Preoxygenation: Pre-oxygenation with 100% oxygen Induction Type: IV induction Ventilation: Mask ventilation without difficulty LMA: LMA inserted LMA Size: 5.0 Number of attempts: 1 Airway Equipment and Method: Bite block Placement Confirmation: positive ETCO2 Tube secured with: Tape Dental Injury: Teeth and Oropharynx as per pre-operative assessment

## 2021-03-18 NOTE — Op Note (Signed)
03/18/2021  12:05 PM  PATIENT:  Eric Lewis  50 y.o. male  Patient Care Team: Lujean Amel, MD as PCP - General (Family Medicine) Ladell Pier, MD as Consulting Physician (Oncology)  PRE-OPERATIVE DIAGNOSIS:  Locally advanced rectal cancer  POST-OPERATIVE DIAGNOSIS:  Same  PROCEDURE:   1. Placement of port-a-cath 2. Ultrasound guided access of right internal jugular vein 3. Fluoroscopy for placement of port-a-cath  SURGEON:  Sharon Mt. Demitrios Molyneux, MD  ANESTHESIA:   local and general  COUNTS:  Sponge, needle and instrument counts were reported correct x2 at the conclusion of the operation.  EBL: 5 mL  SPECIMEN: None  COMPLICATIONS: None  FINDINGS: Right internal jugular vein accessed on first attempt with ultrasound guidance. Port-a-cath placed with fluoroscopic guidance to approximately the cavo-atrial junction. Due to habitus, a skip incision was necessary for tunneling of the tubing purposes.  DISPOSITION: PACU in satisfactory condition  DESCRIPTION: The patient was identified in preop holding and taken to the OR where he was placed on the operating room table. SCDs were placed. General endotracheal anesthesia was induced without difficulty. Arms were then padded and tucked. Hair on the neck and right face and chest were clipped.  The neck was surveyed with an ultrasound and the right internal jugular vein was identified.  He was then prepped and draped in the usual sterile fashion. A surgical timeout was performed indicating the correct patient, procedure, positioning and need for preoperative antibiotics.   Under ultrasound guidance, the right internal jugular vein was reidentified.  Local anesthetic is infiltrated.  The right internal jugular vein was accessed on our first attempt.  The J-wire was advanced.  Fluoroscopy confirmed this to be within the jugular vein staying on the right side of the thorax.  The peel-away sheath was then introduced down the J-wire  under fluoroscopic guidance.  The J-wire was removed.  The port tubing was flushed.  This was then placed deeply within the peel-away sheath.  The sheath was then cracked and removed.  Dilute Omnipaque was instilled within the port tubing.  Fluoroscopy was utilized to confirm the tubing remained in appropriate position.  Attention was then turned to creating the port pocket.  In the right chest wall, skin incision was created.  Subcutaneous tissue was dissected.  The pectoralis fascia was identified.  A port pocket was then developed using electrocautery.  Hemostasis was appreciated in this pocket.  A tunneling device was then used to tunnel the port tubing to the chest pocket.  Of note, due to his habitus, a skip incision was necessary midway down just above the clavicle to facilitate tunneling.  The port was then brought onto the field and confirmed to be working appropriately.  2-0 Prolene sutures were then used to secure the port to the pectoralis fascia.  Under fluoroscopic guidance, the port tubing was then carefully pulled back until the tip of the tubing rest at roughly the cavoatrial junction.  The tubing was then cut.  This was then attached to the port using the connector device.  Transcutaneously, the port was accessed with a Huber needle.  This aspirates blood freely and flushes well.  This is flushed with heparinized saline.  This is then locked with heparin at a concentration of 500 units/mL.  The port pocket skin is then closed in 2 layers with 3-0 Vicryl deep dermal suture followed by 4-0 Monocryl subcuticular suture.  4 Monocryl also used to close the stab incision at the neck as well as the  skip incision for tunneling.  The wounds were rinsed.  Dermabond was applied to all incisions.  He is then awakened from anesthesia, extubated, and transferred to a stretcher for transport to PACU in satisfactory condition.  A postprocedure chest x-ray has been ordered.

## 2021-03-18 NOTE — Transfer of Care (Signed)
Immediate Anesthesia Transfer of Care Note  Patient: Eric Lewis  Procedure(s) Performed: Procedure(s) (LRB): PLACEMENT PORT-A-CATH (Right)  Patient Location: PACU  Anesthesia Type: General  Level of Consciousness: awake, alert  and oriented  Airway & Oxygen Therapy: Patient Spontanous Breathing and Patient connected to nasal cannula oxygen  Post-op Assessment: Report given to PACU RN and Post -op Vital signs reviewed and stable  Post vital signs: Reviewed and stable  Complications: No apparent anesthesia complications  Last Vitals:  Vitals Value Taken Time  BP 167/96 03/18/21 1215  Temp    Pulse 98 03/18/21 1218  Resp 18 03/18/21 1218  SpO2 94 % 03/18/21 1218  Vitals shown include unvalidated device data.  Last Pain:  Vitals:   03/18/21 0916  TempSrc: Oral  PainSc: 0-No pain         Complications: No complications documented.

## 2021-03-18 NOTE — Interval H&P Note (Signed)
History and Physical Interval Note:  03/18/2021 9:06 AM  Eric Lewis  has presented today for surgery, with the diagnosis of [name].  Medical oncology has requested port-a-cath placement for administration of chemotherapy.   -The anatomy of the cervical and thoracic vasculature has been reviewed with the patient. We discussed the procedure - placement of port-a-cath with ultrasound and fluoroscopic guidance. We discussed the technical aspects, material risks (including, but not limited to, pain, bleeding, infection, scarring, need for blood transfusion, damage to surrounding structures- blood vessels/nerves/lung, pneumothorax, hemothorax malpositioning of port, need for additional procedures, worsening of pre-existing medical conditions, pneumonia, heart attack, stroke, death) benefits and alternatives to surgery were discussed at length. The patient's questions were answered to his satisfaction, he voiced understanding and elected to proceed with surgery. Additionally, we discussed typical postoperative expectations and the recovery process.    Procedure(s): PLACEMENT PORT-A-CATH (N/A) as a surgical intervention.  The patient's history has been reviewed, patient examined, no change in status, stable for surgery.  I have reviewed the patient's chart and labs.  Questions were answered to the patient's satisfaction.    Nadeen Landau, MD [facility] Use [email or web address] to contact on call provider

## 2021-03-18 NOTE — Discharge Instructions (Addendum)
Post Anesthesia Home Care Instructions  Activity: Get plenty of rest for the remainder of the day. A responsible individual must stay with you for 24 hours following the procedure.  For the next 24 hours, DO NOT: -Drive a car -Paediatric nurse -Drink alcoholic beverages -Take any medication unless instructed by your physician -Make any legal decisions or sign important papers.  Meals: Start with liquid foods such as gelatin or soup. Progress to regular foods as tolerated. Avoid greasy, spicy, heavy foods. If nausea and/or vomiting occur, drink only clear liquids until the nausea and/or vomiting subsides. Call your physician if vomiting continues.  Special Instructions/Symptoms: Your throat may feel dry or sore from the anesthesia or the breathing tube placed in your throat during surgery. If this causes discomfort, gargle with warm salt water. The discomfort should disappear within 24 hours.  If you had a scopolamine patch placed behind your ear for the management of post- operative nausea and/or vomiting:  1. The medication in the patch is effective for 72 hours, after which it should be removed.  Wrap patch in a tissue and discard in the trash. Wash hands thoroughly with soap and water. 2. You may remove the patch earlier than 72 hours if you experience unpleasant side effects which may include dry mouth, dizziness or visual disturbances. 3. Avoid touching the patch. Wash your hands with soap and water after contact with the patch.    POST OP INSTRUCTIONS  1. DIET: As tolerated. Follow a light bland diet the first 24 hours after arrival home, such as soup, liquids, crackers, etc.  Be sure to include lots of fluids daily.  Avoid fast food or heavy meals as your are more likely to get nauseated.  Eat a low fat the next few days after surgery.  2. Take your usually prescribed home medications unless otherwise directed.  3. PAIN CONTROL: a. Pain is best controlled by a usual  combination of three different methods TOGETHER: i. Ice/Heat ii. Over the counter pain medication iii. Prescription pain medication b. Most patients will experience some swelling and bruising around the surgical site.  Ice packs or heating pads (30-60 minutes up to 6 times a day) will help. Some people prefer to use ice alone, heat alone, alternating between ice & heat.  Experiment to what works for you.  Swelling and bruising can take several weeks to resolve.   c. It is helpful to take an over-the-counter pain medication regularly for the first few weeks: i. Ibuprofen (Motrin/Advil) - 200mg  tabs - take 3 tabs (600mg ) every 6 hours as needed for pain ii. Acetaminophen (Tylenol) - you may take 650mg  every 6 hours as needed. You can take this with motrin as they act differently on the body. If you are taking a narcotic pain medication that has acetaminophen in it, do not take over the counter tylenol at the same time.  Iii. NOTE: You may take both of these medications together - most patients  find it most helpful when alternating between the two (i.e. Ibuprofen at 6am, tylenol at 9am, ibuprofen at 12pm ...) d. A  prescription for pain medication should be given to you upon discharge.  Take your pain medication as prescribed if your pain is not adequatly controlled with the over-the-counter pain reliefs mentioned above.  4. Avoid getting constipated.  Between the surgery and the pain medications, it is common to experience some constipation.  Increasing fluid intake and taking a fiber supplement (such as Metamucil, Citrucel, FiberCon, MiraLax, etc)  1-2 times a day regularly will usually help prevent this problem from occurring.  A mild laxative (prune juice, Milk of Magnesia, MiraLax, etc) should be taken according to package directions if there are no bowel movements after 48 hours.    5. Dressing: Your incisions are covered in Dermabond which is like sterile superglue for the skin. This will come off  on it's own in a couple weeks. It is waterproof and you may bathe normally starting the day after your surgery in a shower. Avoid baths/pools/lakes/oceans until your wounds have fully healed. 6. Port-a-cath care: medical oncology (Dr. Benay Spice and his oncology nursing team) will assist in port maintenance including administration of medications through the port and flushing the port.  7. ACTIVITIES as tolerated:   a. You may resume regular (light) daily activities beginning the next day--such as daily self-care, walking, climbing stairs--gradually increasing activities as tolerated.  If you can walk 30 minutes without difficulty, it is safe to try more intense activity such as jogging, treadmill, bicycling, low-impact aerobics.  b. DO NOT PUSH THROUGH PAIN.  Let pain be your guide: If it hurts to do something, don't do it. c. You may drive when you are no longer taking prescription pain medication, you can comfortably wear a seatbelt, and you can safely maneuver your car and apply brakes.   8. If everything is going well with your port and you are having no issues, you do not need a postoperative follow-up appointment for the port placement procedure. If you have any questions or concerns, please reach out to our office at the number below.   When to call us [phone]: 1. Poor pain control 2. Reactions / problems with new medications (rash/itching, etc)  3. Fever over 101.5 F (38.5 C) 4. Inability to urinate 5. Nausea/vomiting 6. Worsening swelling or bruising 7. Continued bleeding from incision. 8. Increased pain, redness, or drainage from the incision  The clinic staff is available to answer your questions during regular business hours (8:30am-5pm).  Please don't hesitate to call and ask to speak to one of our nurses for clinical concerns.   A surgeon from [facility] is always on call at the hospitals   If you have a medical emergency, go to the nearest emergency room or  call 911.  [facility], [place] [address] [place], [place], [place]  [address] MAIN: [phone] FAX: [phone] [email or web address]

## 2021-03-18 NOTE — Anesthesia Postprocedure Evaluation (Signed)
Anesthesia Post Note  Patient: Eric Lewis  Procedure(s) Performed: PLACEMENT PORT-A-CATH (Right )     Patient location during evaluation: PACU Anesthesia Type: General Level of consciousness: awake and alert, oriented and patient cooperative Pain management: pain level controlled Vital Signs Assessment: post-procedure vital signs reviewed and stable Respiratory status: spontaneous breathing, nonlabored ventilation and respiratory function stable Cardiovascular status: blood pressure returned to baseline and stable Postop Assessment: no apparent nausea or vomiting Anesthetic complications: no   No complications documented.  Last Vitals:  Vitals:   03/18/21 1230 03/18/21 1245  BP: (!) 178/89 (!) 169/91  Pulse: 95 87  Resp: 16 17  Temp:    SpO2: 95% 98%    Last Pain:  Vitals:   03/18/21 1230  TempSrc:   PainSc: 0-No pain                 [name]

## 2021-03-20 ENCOUNTER — Other Ambulatory Visit: Payer: Self-pay | Admitting: Oncology

## 2021-03-20 DIAGNOSIS — Z1379 Encounter for other screening for genetic and chromosomal anomalies: Secondary | ICD-10-CM | POA: Insufficient documentation

## 2021-03-21 ENCOUNTER — Encounter (HOSPITAL_BASED_OUTPATIENT_CLINIC_OR_DEPARTMENT_OTHER): Payer: Self-pay | Admitting: Surgery

## 2021-03-22 ENCOUNTER — Inpatient Hospital Stay: Payer: BC Managed Care – PPO

## 2021-03-22 ENCOUNTER — Ambulatory Visit: Payer: Self-pay | Admitting: Genetic Counselor

## 2021-03-22 ENCOUNTER — Other Ambulatory Visit: Payer: Self-pay

## 2021-03-22 ENCOUNTER — Inpatient Hospital Stay (HOSPITAL_BASED_OUTPATIENT_CLINIC_OR_DEPARTMENT_OTHER): Payer: BC Managed Care – PPO | Admitting: Oncology

## 2021-03-22 ENCOUNTER — Telehealth: Payer: Self-pay | Admitting: Genetic Counselor

## 2021-03-22 ENCOUNTER — Encounter: Payer: Self-pay | Admitting: Genetic Counselor

## 2021-03-22 VITALS — BP 125/92 | HR 97 | Temp 97.8°F | Resp 20 | Ht 67.0 in | Wt 256.0 lb

## 2021-03-22 DIAGNOSIS — C20 Malignant neoplasm of rectum: Secondary | ICD-10-CM

## 2021-03-22 DIAGNOSIS — Z452 Encounter for adjustment and management of vascular access device: Secondary | ICD-10-CM | POA: Diagnosis not present

## 2021-03-22 DIAGNOSIS — Z8616 Personal history of COVID-19: Secondary | ICD-10-CM | POA: Diagnosis not present

## 2021-03-22 DIAGNOSIS — Z5111 Encounter for antineoplastic chemotherapy: Secondary | ICD-10-CM | POA: Diagnosis not present

## 2021-03-22 DIAGNOSIS — E119 Type 2 diabetes mellitus without complications: Secondary | ICD-10-CM | POA: Diagnosis not present

## 2021-03-22 DIAGNOSIS — Z1379 Encounter for other screening for genetic and chromosomal anomalies: Secondary | ICD-10-CM

## 2021-03-22 LAB — CMP (CANCER CENTER ONLY)
ALT: 22 U/L (ref 0–44)
AST: 12 U/L — ABNORMAL LOW (ref 15–41)
Albumin: 4 g/dL (ref 3.5–5.0)
Alkaline Phosphatase: 66 U/L (ref 38–126)
Anion gap: 9 (ref 5–15)
BUN: 16 mg/dL (ref 6–20)
CO2: 25 mmol/L (ref 22–32)
Calcium: 8.6 mg/dL — ABNORMAL LOW (ref 8.9–10.3)
Chloride: 103 mmol/L (ref 98–111)
Creatinine: 0.87 mg/dL (ref 0.61–1.24)
GFR, Estimated: >60 mL/min (ref >60)
Glucose, Bld: 121 mg/dL — ABNORMAL HIGH (ref 70–99)
Potassium: 4.1 mmol/L (ref 3.5–5.1)
Sodium: 137 mmol/L (ref 135–145)
Total Bilirubin: 0.5 mg/dL (ref 0.3–1.2)
Total Protein: 6.3 g/dL — ABNORMAL LOW (ref 6.5–8.1)

## 2021-03-22 LAB — CBC WITH DIFFERENTIAL (CANCER CENTER ONLY)
Abs Immature Granulocytes: 0.05 10*3/uL (ref 0.00–0.07)
Basophils Absolute: 0.1 10*3/uL (ref 0.0–0.1)
Basophils Relative: 1 %
Eosinophils Absolute: 0.3 10*3/uL (ref 0.0–0.5)
Eosinophils Relative: 3 %
HCT: 45.2 % (ref 39.0–52.0)
Hemoglobin: 14.6 g/dL (ref 13.0–17.0)
Immature Granulocytes: 1 %
Lymphocytes Relative: 27 %
Lymphs Abs: 2.4 10*3/uL (ref 0.7–4.0)
MCH: 28.2 pg (ref 26.0–34.0)
MCHC: 32.3 g/dL (ref 30.0–36.0)
MCV: 87.3 fL (ref 80.0–100.0)
Monocytes Absolute: 0.6 10*3/uL (ref 0.1–1.0)
Monocytes Relative: 7 %
Neutro Abs: 5.5 10*3/uL (ref 1.7–7.7)
Neutrophils Relative %: 61 %
Platelet Count: 254 10*3/uL (ref 150–400)
RBC: 5.18 MIL/uL (ref 4.22–5.81)
RDW: 13.2 % (ref 11.5–15.5)
WBC Count: 8.9 10*3/uL (ref 4.0–10.5)
nRBC: 0 % (ref 0.0–0.2)

## 2021-03-22 LAB — CEA (ACCESS): CEA (CHCC): 4.12 ng/mL (ref 0.00–5.00)

## 2021-03-22 MED ORDER — OXALIPLATIN CHEMO INJECTION 100 MG/20ML
85.0000 mg/m2 | Freq: Once | INTRAVENOUS | Status: AC
  Administered 2021-03-22: 200 mg via INTRAVENOUS
  Filled 2021-03-22: qty 40

## 2021-03-22 MED ORDER — DEXTROSE 5 % IV SOLN
Freq: Once | INTRAVENOUS | Status: AC
  Filled 2021-03-22: qty 250

## 2021-03-22 MED ORDER — PALONOSETRON HCL INJECTION 0.25 MG/5ML
0.2500 mg | Freq: Once | INTRAVENOUS | Status: AC
  Administered 2021-03-22: 0.25 mg via INTRAVENOUS
  Filled 2021-03-22: qty 5

## 2021-03-22 MED ORDER — SODIUM CHLORIDE 0.9 % IV SOLN
10.0000 mg | Freq: Once | INTRAVENOUS | Status: AC
  Administered 2021-03-22: 10 mg via INTRAVENOUS
  Filled 2021-03-22: qty 1

## 2021-03-22 MED ORDER — SODIUM CHLORIDE 0.9% FLUSH
10.0000 mL | INTRAVENOUS | Status: DC | PRN
  Administered 2021-03-22: 10 mL
  Filled 2021-03-22: qty 10

## 2021-03-22 MED ORDER — LEUCOVORIN CALCIUM INJECTION 350 MG
400.0000 mg/m2 | Freq: Once | INTRAMUSCULAR | Status: AC
  Administered 2021-03-22: 932 mg via INTRAVENOUS
  Filled 2021-03-22: qty 46.6

## 2021-03-22 MED ORDER — SODIUM CHLORIDE 0.9 % IV SOLN
2400.0000 mg/m2 | INTRAVENOUS | Status: DC
  Administered 2021-03-22: 5600 mg via INTRAVENOUS
  Filled 2021-03-22: qty 112

## 2021-03-22 MED ORDER — FLUOROURACIL CHEMO INJECTION 2.5 GM/50ML
400.0000 mg/m2 | Freq: Once | INTRAVENOUS | Status: AC
  Administered 2021-03-22: 950 mg via INTRAVENOUS
  Filled 2021-03-22: qty 19

## 2021-03-22 NOTE — Patient Instructions (Signed)
[facility]  Discharge Instructions: Thank you for choosing [facility] to provide your oncology and hematology care.   If you have a lab appointment with the Cancer Center, please go directly to the Cancer Center and check in at the registration area.   Wear comfortable clothing and clothing appropriate for easy access to any Portacath or PICC line.   We strive to give you quality time with your provider. You may need to reschedule your appointment if you arrive late (15 or more minutes).  Arriving late affects you and other patients whose appointments are after yours.  Also, if you miss three or more appointments without notifying the office, you may be dismissed from the clinic at the provider's discretion.      For prescription refill requests, have your pharmacy contact our office and allow 72 hours for refills to be completed.    Today you received the following chemotherapy and/or immunotherapy agents oxaliplatin, leucovorin, fluorouracil     To help prevent nausea and vomiting after your treatment, we encourage you to take your nausea medication as directed.  BELOW ARE SYMPTOMS THAT SHOULD BE REPORTED IMMEDIATELY: . *FEVER GREATER THAN 100.4 F (38 C) OR HIGHER . *CHILLS OR SWEATING . *NAUSEA AND VOMITING THAT IS NOT CONTROLLED WITH YOUR NAUSEA MEDICATION . *UNUSUAL SHORTNESS OF BREATH . *UNUSUAL BRUISING OR BLEEDING . *URINARY PROBLEMS (pain or burning when urinating, or frequent urination) . *BOWEL PROBLEMS (unusual diarrhea, constipation, pain near the anus) . TENDERNESS IN MOUTH AND THROAT WITH OR WITHOUT PRESENCE OF ULCERS (sore throat, sores in mouth, or a toothache) . UNUSUAL RASH, SWELLING OR PAIN  . UNUSUAL VAGINAL DISCHARGE OR ITCHING   Items with * indicate a potential emergency and should be followed up as soon as possible or go to the Emergency Department if any problems should occur.  Please show the CHEMOTHERAPY ALERT CARD  or IMMUNOTHERAPY ALERT CARD at check-in to the Emergency Department and triage nurse.  Should you have questions after your visit or need to cancel or reschedule your appointment, please contact [facility]  Dept: [phone]  and follow the prompts.  Office hours are 8:00 a.m. to 4:30 p.m. Monday - Friday. Please note that voicemails left after 4:00 p.m. may not be returned until the following business day.  We are closed weekends and major holidays. You have access to a nurse at all times for urgent questions. Please call the main number to the clinic Dept: [phone] and follow the prompts.   For any non-urgent questions, you may also contact your provider using MyChart. We now offer e-Visits for anyone 18 and older to request care online for non-urgent symptoms. For details visit [email or web address].   Also download the MyChart app! Go to the app store, search "MyChart", open the app, select [facility], and log in with your MyChart username and password.  Due to Covid, a mask is required upon entering the hospital/clinic. If you do not have a mask, one will be given to you upon arrival. For doctor visits, patients may have 1 support person aged 18 or older with them. For treatment visits, patients cannot have anyone with them due to current Covid guidelines and our immunocompromised population.   Oxaliplatin Injection What is this medicine? OXALIPLATIN (ox AL i PLA tin) is a chemotherapy drug. It targets fast dividing cells, like cancer cells, and causes these cells to die. This medicine is used to treat cancers of   of the colon and rectum, and many other cancers. This medicine may be used for other purposes; ask your health care provider or pharmacist if you have questions. COMMON BRAND NAME(S): Eloxatin What should I tell my health care provider before I take this medicine? They need to know if you have any of these conditions:  heart disease  history of  irregular heartbeat  liver disease  low blood counts, like white cells, platelets, or red blood cells  lung or breathing disease, like asthma  take medicines that treat or prevent blood clots  tingling of the fingers or toes, or other nerve disorder  an unusual or allergic reaction to oxaliplatin, other chemotherapy, other medicines, foods, dyes, or preservatives  pregnant or trying to get pregnant  breast-feeding How should I use this medicine? This drug is given as an infusion into a vein. It is administered in a hospital or clinic by a specially trained health care professional. Talk to your pediatrician regarding the use of this medicine in children. Special care may be needed. Overdosage: If you think you have taken too much of this medicine contact a poison control center or emergency room at once. NOTE: This medicine is only for you. Do not share this medicine with others. What if I miss a dose? It is important not to miss a dose. Call your doctor or health care professional if you are unable to keep an appointment. What may interact with this medicine? Do not take this medicine with any of the following medications:  cisapride  dronedarone  pimozide  thioridazine This medicine may also interact with the following medications:  aspirin and aspirin-like medicines  certain medicines that treat or prevent blood clots like warfarin, apixaban, dabigatran, and rivaroxaban  cisplatin  cyclosporine  diuretics  medicines for infection like acyclovir, adefovir, amphotericin B, bacitracin, cidofovir, foscarnet, ganciclovir, gentamicin, pentamidine, vancomycin  NSAIDs, medicines for pain and inflammation, like ibuprofen or naproxen  other medicines that prolong the QT interval (an abnormal heart rhythm)  pamidronate  zoledronic acid This list may not describe all possible interactions. Give your health care provider a list of all the medicines, herbs,  non-prescription drugs, or dietary supplements you use. Also tell them if you smoke, drink alcohol, or use illegal drugs. Some items may interact with your medicine. What should I watch for while using this medicine? Your condition will be monitored carefully while you are receiving this medicine. You may need blood work done while you are taking this medicine. This medicine may make you feel generally unwell. This is not uncommon as chemotherapy can affect healthy cells as well as cancer cells. Report any side effects. Continue your course of treatment even though you feel ill unless your healthcare professional tells you to stop. This medicine can make you more sensitive to cold. Do not drink cold drinks or use ice. Cover exposed skin before coming in contact with cold temperatures or cold objects. When out in cold weather wear warm clothing and cover your mouth and nose to warm the air that goes into your lungs. Tell your doctor if you get sensitive to the cold. Do not become pregnant while taking this medicine or for 9 months after stopping it. Women should inform their health care professional if they wish to become pregnant or think they might be pregnant. Men should not father a child while taking this medicine and for 6 months after stopping it. There is potential for serious side effects to an unborn child. Talk  to your health care professional for more information. Do not breast-feed a child while taking this medicine or for 3 months after stopping it. This medicine has caused ovarian failure in some women. This medicine may make it more difficult to get pregnant. Talk to your health care professional if you are concerned about your fertility. This medicine has caused decreased sperm counts in some men. This may make it more difficult to father a child. Talk to your health care professional if you are concerned about your fertility. This medicine may increase your risk of getting an infection.  Call your health care professional for advice if you get a fever, chills, or sore throat, or other symptoms of a cold or flu. Do not treat yourself. Try to avoid being around people who are sick. Avoid taking medicines that contain aspirin, acetaminophen, ibuprofen, naproxen, or ketoprofen unless instructed by your health care professional. These medicines may hide a fever. Be careful brushing or flossing your teeth or using a toothpick because you may get an infection or bleed more easily. If you have any dental work done, tell your dentist you are receiving this medicine. What side effects may I notice from receiving this medicine? Side effects that you should report to your doctor or health care professional as soon as possible:  allergic reactions like skin rash, itching or hives, swelling of the face, lips, or tongue  breathing problems  cough  low blood counts - this medicine may decrease the number of white blood cells, red blood cells, and platelets. You may be at increased risk for infections and bleeding  nausea, vomiting  pain, redness, or irritation at site where injected  pain, tingling, numbness in the hands or feet  signs and symptoms of bleeding such as bloody or black, tarry stools; red or dark brown urine; spitting up blood or brown material that looks like coffee grounds; red spots on the skin; unusual bruising or bleeding from the eyes, gums, or nose  signs and symptoms of a dangerous change in heartbeat or heart rhythm like chest pain; dizziness; fast, irregular heartbeat; palpitations; feeling faint or lightheaded; falls  signs and symptoms of infection like fever; chills; cough; sore throat; pain or trouble passing urine  signs and symptoms of liver injury like dark yellow or brown urine; general ill feeling or flu-like symptoms; light-colored stools; loss of appetite; nausea; right upper belly pain; unusually weak or tired; yellowing of the eyes or skin  signs and  symptoms of low red blood cells or anemia such as unusually weak or tired; feeling faint or lightheaded; falls  signs and symptoms of muscle injury like dark urine; trouble passing urine or change in the amount of urine; unusually weak or tired; muscle pain; back pain Side effects that usually do not require medical attention (report to your doctor or health care professional if they continue or are bothersome):  changes in taste  diarrhea  gas  hair loss  loss of appetite  mouth sores This list may not describe all possible side effects. Call your doctor for medical advice about side effects. You may report side effects to FDA at 1-800-FDA-1088. Where should I keep my medicine? This drug is given in a hospital or clinic and will not be stored at home. NOTE: This sheet is a summary. It may not cover all possible information. If you have questions about this medicine, talk to your doctor, pharmacist, or health care provider.  2021 Elsevier/Gold Standard (2019-03-12 12:20:35)  Leucovorin  What is this medicine? LEUCOVORIN (loo koe VOR in) is used to prevent or treat the harmful effects of some medicines. This medicine is used to treat anemia caused by a low amount of folic acid in the body. It is also used with 5-fluorouracil (5-FU) to treat colon cancer. This medicine may be used for other purposes; ask your health care provider or pharmacist if you have questions. What should I tell my health care provider before I take this medicine? They need to know if you have any of these conditions:  anemia from low levels of vitamin B-12 in the blood  an unusual or allergic reaction to leucovorin, folic acid, other medicines, foods, dyes, or preservatives  pregnant or trying to get pregnant  breast-feeding How should I use this medicine? This medicine is for injection into a muscle or into a vein. It is given by a health care professional in a hospital or clinic setting. Talk to  your pediatrician regarding the use of this medicine in children. Special care may be needed. Overdosage: If you think you have taken too much of this medicine contact a poison control center or emergency room at once. NOTE: This medicine is only for you. Do not share this medicine with others. What if I miss a dose? This does not apply. What may interact with this medicine?  capecitabine  fluorouracil  phenobarbital  phenytoin  primidone  trimethoprim-sulfamethoxazole This list may not describe all possible interactions. Give your health care provider a list of all the medicines, herbs, non-prescription drugs, or dietary supplements you use. Also tell them if you smoke, drink alcohol, or use illegal drugs. Some items may interact with your medicine. What should I watch for while using this medicine? Your condition will be monitored carefully while you are receiving this medicine. This medicine may increase the side effects of 5-fluorouracil, 5-FU. Tell your doctor or health care professional if you have diarrhea or mouth sores that do not get better or that get worse. What side effects may I notice from receiving this medicine? Side effects that you should report to your doctor or health care professional as soon as possible:  allergic reactions like skin rash, itching or hives, swelling of the face, lips, or tongue  breathing problems  fever, infection  mouth sores  unusual bleeding or bruising  unusually weak or tired Side effects that usually do not require medical attention (report to your doctor or health care professional if they continue or are bothersome):  constipation or diarrhea  loss of appetite  nausea, vomiting This list may not describe all possible side effects. Call your doctor for medical advice about side effects. You may report side effects to FDA at 1-800-FDA-1088. Where should I keep my medicine? This drug is given in a hospital or clinic and will  not be stored at home. NOTE: This sheet is a summary. It may not cover all possible information. If you have questions about this medicine, talk to your doctor, pharmacist, or health care provider.  2021 Elsevier/Gold Standard (2008-04-28 16:50:29)  Fluorouracil, 5-FU injection What is this medicine? FLUOROURACIL, 5-FU (flure oh YOOR a sil) is a chemotherapy drug. It slows the growth of cancer cells. This medicine is used to treat many types of cancer like breast cancer, colon or rectal cancer, pancreatic cancer, and stomach cancer. This medicine may be used for other purposes; ask your health care provider or pharmacist if you have questions. COMMON BRAND NAME(S): Adrucil What should I tell my   health care provider before I take this medicine? They need to know if you have any of these conditions:  blood disorders  dihydropyrimidine dehydrogenase (DPD) deficiency  infection (especially a virus infection such as chickenpox, cold sores, or herpes)  kidney disease  liver disease  malnourished, poor nutrition  recent or ongoing radiation therapy  an unusual or allergic reaction to fluorouracil, other chemotherapy, other medicines, foods, dyes, or preservatives  pregnant or trying to get pregnant  breast-feeding How should I use this medicine? This drug is given as an infusion or injection into a vein. It is administered in a hospital or clinic by a specially trained health care professional. Talk to your pediatrician regarding the use of this medicine in children. Special care may be needed. Overdosage: If you think you have taken too much of this medicine contact a poison control center or emergency room at once. NOTE: This medicine is only for you. Do not share this medicine with others. What if I miss a dose? It is important not to miss your dose. Call your doctor or health care professional if you are unable to keep an appointment. What may interact with this medicine? Do not  take this medicine with any of the following medications:  live virus vaccines This medicine may also interact with the following medications:  medicines that treat or prevent blood clots like warfarin, enoxaparin, and dalteparin This list may not describe all possible interactions. Give your health care provider a list of all the medicines, herbs, non-prescription drugs, or dietary supplements you use. Also tell them if you smoke, drink alcohol, or use illegal drugs. Some items may interact with your medicine. What should I watch for while using this medicine? Visit your doctor for checks on your progress. This drug may make you feel generally unwell. This is not uncommon, as chemotherapy can affect healthy cells as well as cancer cells. Report any side effects. Continue your course of treatment even though you feel ill unless your doctor tells you to stop. In some cases, you may be given additional medicines to help with side effects. Follow all directions for their use. Call your doctor or health care professional for advice if you get a fever, chills or sore throat, or other symptoms of a cold or flu. Do not treat yourself. This drug decreases your body's ability to fight infections. Try to avoid being around people who are sick. This medicine may increase your risk to bruise or bleed. Call your doctor or health care professional if you notice any unusual bleeding. Be careful brushing and flossing your teeth or using a toothpick because you may get an infection or bleed more easily. If you have any dental work done, tell your dentist you are receiving this medicine. Avoid taking products that contain aspirin, acetaminophen, ibuprofen, naproxen, or ketoprofen unless instructed by your doctor. These medicines may hide a fever. Do not become pregnant while taking this medicine. Women should inform their doctor if they wish to become pregnant or think they might be pregnant. There is a potential for  serious side effects to an unborn child. Talk to your health care professional or pharmacist for more information. Do not breast-feed an infant while taking this medicine. Men should inform their doctor if they wish to father a child. This medicine may lower sperm counts. Do not treat diarrhea with over the counter products. Contact your doctor if you have diarrhea that lasts more than 2 days or if it is severe and   watery. This medicine can make you more sensitive to the sun. Keep out of the sun. If you cannot avoid being in the sun, wear protective clothing and use sunscreen. Do not use sun lamps or tanning beds/booths. What side effects may I notice from receiving this medicine? Side effects that you should report to your doctor or health care professional as soon as possible:  allergic reactions like skin rash, itching or hives, swelling of the face, lips, or tongue  low blood counts - this medicine may decrease the number of white blood cells, red blood cells and platelets. You may be at increased risk for infections and bleeding.  signs of infection - fever or chills, cough, sore throat, pain or difficulty passing urine  signs of decreased platelets or bleeding - bruising, pinpoint red spots on the skin, black, tarry stools, blood in the urine  signs of decreased red blood cells - unusually weak or tired, fainting spells, lightheadedness  breathing problems  changes in vision  chest pain  mouth sores  nausea and vomiting  pain, swelling, redness at site where injected  pain, tingling, numbness in the hands or feet  redness, swelling, or sores on hands or feet  stomach pain  unusual bleeding Side effects that usually do not require medical attention (report to your doctor or health care professional if they continue or are bothersome):  changes in finger or toe nails  diarrhea  dry or itchy skin  hair loss  headache  loss of appetite  sensitivity of eyes to the  light  stomach upset  unusually teary eyes This list may not describe all possible side effects. Call your doctor for medical advice about side effects. You may report side effects to FDA at 1-800-FDA-1088. Where should I keep my medicine? This drug is given in a hospital or clinic and will not be stored at home. NOTE: This sheet is a summary. It may not cover all possible information. If you have questions about this medicine, talk to your doctor, pharmacist, or health care provider.  2021 Elsevier/Gold Standard (2019-09-23 15:00:03)      

## 2021-03-22 NOTE — Progress Notes (Signed)
  [facility] OFFICE PROGRESS NOTE   Diagnosis: Rectal cancer  INTERVAL HISTORY:   Eric Lewis returns as scheduled.  No complaint.  He underwent Port-A-Cath placement by Dr. Dema Severin on 03/18/2021.  He saw Dr. Ronita Hipps on 03/16/2021 (note not available) and reports he underwent rectal examination.  Dr. Ronita Hipps agrees with the plan for total neoadjuvant therapy.  Objective:  Vital signs in last 24 hours:  Blood pressure (!) 125/92, pulse 97, temperature 97.8 F (36.6 C), temperature source Oral, resp. rate 20, height 5\' 7"  (1.702 m), weight 256 lb (116.1 kg), SpO2 97 %.    Resp: Lungs clear bilaterally Cardio: Regular rate and rhythm Vascular: No leg edema    Portacath/PICC-without erythema  Lab Results:  Lab Results  Component Value Date   WBC 8.9 03/22/2021   HGB 14.6 03/22/2021   HCT 45.2 03/22/2021   MCV 87.3 03/22/2021   PLT 254 03/22/2021   NEUTROABS 5.5 03/22/2021    CMP  Lab Results  Component Value Date   NA 137 03/22/2021   K 4.1 03/22/2021   CL 103 03/22/2021   CO2 25 03/22/2021   GLUCOSE 121 (H) 03/22/2021   BUN 16 03/22/2021   CREATININE 0.87 03/22/2021   CALCIUM 8.6 (L) 03/22/2021   PROT 6.3 (L) 03/22/2021   ALBUMIN 4.0 03/22/2021   AST 12 (L) 03/22/2021   ALT 22 03/22/2021   ALKPHOS 66 03/22/2021   BILITOT 0.5 03/22/2021   GFRNONAA >60 03/22/2021   GFRAA >60 05/30/2016     Imaging:  DG Chest Portable 1 View  Result Date: 03/18/2021 CLINICAL DATA:  Status post Port-A-Cath placement. EXAM: PORTABLE CHEST 1 VIEW COMPARISON:  05/30/2016 FINDINGS: Interval placement of RIGHT-sided PowerPort, tip overlying the level of the superior vena cava. Heart size is accentuated by portable technique. No evidence for pneumothorax. No pulmonary edema. IMPRESSION: Interval placement of RIGHT-sided PowerPort, tip overlying the superior vena cava. Electronically Signed   By: Nolon Nations M.D.   On: 03/18/2021 12:54   DG C-Arm 1-60 Min-No  Report  Result Date: 03/18/2021 Fluoroscopy was utilized by the requesting physician.  No radiographic interpretation.    Medications: I have reviewed the patient's current medications.   Assessment/Plan: 1. Rectal cancer  Colonoscopy 02/15/2021- nonobstructing mass at 2-3 cm in the anal verge, removed in a piecemeal fashion, pathology confirmed moderately differentiated adenocarcinoma  CTs chest, abdomen, and pelvis on 02/17/2021- no evidence of metastatic disease, no clear mass identified, areas of mild rectal wall thickening  MRI 03/02/2021- rectal tumor not identified, no extension beyond the muscularis identified.  N1 disease with multiple left mesorectal nodes, and a 7 mm node beyond the mesorectum at the hypogastric region  Cycle 1 FOLFOX 03/22/2021  2. History of dysphagia-status post a barium swallow 10/22/2020- mild smooth stricture at the GE junction 3. Psoriatic arthritis 4. Diabetes 5. COVID-19 January 22    Disposition: Eric Lewis appears well.  He underwent Port-A-Cath placement 03/18/2021.  He was seen in consultation by Dr. Ronita Hipps on 03/16/2021.  The plan is to proceed with total neoadjuvant therapy.  He will complete cycle 1 FOLFOX today.  We again reviewed potential toxicities associated with this regimen.  He understands the likelihood of hyperglycemia after the Decadron prophylaxis.  He will call for a blood sugar of greater than 400.  Eric Lewis will return for an office visit and cycle 2 FOLFOX in 2 weeks  Betsy Coder, MD  03/22/2021  9:56 AM

## 2021-03-22 NOTE — Progress Notes (Signed)
HPI:  Eric Lewis was previously seen in the [place] clinic due to a personal and family history of cancer and concerns regarding a hereditary predisposition to cancer. Please refer to our prior cancer genetics clinic note for more information regarding our discussion, assessment and recommendations, at the time. Eric Lewis recent genetic test results were disclosed to him, as were recommendations warranted by these results. These results and recommendations are discussed in more detail below.  CANCER HISTORY:  Oncology History  Adenocarcinoma of rectum ([place])  02/24/2021 Initial Diagnosis   Adenocarcinoma of rectum ([place])   03/11/2021 Cancer Staging   Staging form: Colon and Rectum, AJCC 8th Edition - Clinical: Stage Unknown (cTX, cN1) - Signed by Ladell Pier, MD on 03/11/2021   03/20/2021 Genetic Testing   Negative genetic testing:  No pathogenic variants detected on the [facility] CancerNext-Expanded + RNAinsight panel. The report date is 03/20/2021.   The CancerNext-Expanded + RNAinsight gene panel offered by [employer] and includes sequencing and rearrangement analysis for the following 77 genes: AIP, ALK, APC, ATM, AXIN2, BAP1, BARD1, BLM, BMPR1A, BRCA1, BRCA2, BRIP1, CDC73, CDH1, CDK4, CDKN1B, CDKN2A, CHEK2, CTNNA1, DICER1, FANCC, FH, FLCN, GALNT12, KIF1B, LZTR1, MAX, MEN1, MET, MLH1, MSH2, MSH3, MSH6, MUTYH, NBN, NF1, NF2, NTHL1, PALB2, PHOX2B, PMS2, POT1, PRKAR1A, PTCH1, PTEN, RAD51C, RAD51D, RB1, RECQL, RET, SDHA, SDHAF2, SDHB, SDHC, SDHD, SMAD4, SMARCA4, SMARCB1, SMARCE1, STK11, SUFU, TMEM127, TP53, TSC1, TSC2, VHL and XRCC2 (sequencing and deletion/duplication); EGFR, EGLN1, HOXB13, KIT, MITF, PDGFRA, POLD1 and POLE (sequencing only); EPCAM and GREM1 (deletion/duplication only). RNA data is routinely analyzed for use in variant interpretation for all genes.   03/22/2021 -  Chemotherapy    Patient is on Treatment Plan: COLORECTAL FOLFOX Q14D X 4 MONTHS        FAMILY  HISTORY:  We obtained a detailed, 4-generation family history.  Significant diagnoses are listed below: Family History  Problem Relation Age of Onset  . Emphysema Maternal Grandmother   . Breast cancer Paternal Grandmother        dx 63s  . Prostate cancer Paternal Uncle 8   Eric Lewis does not have children. He has two sisters (ages 73 and 67), neither of whom have had cancer.   Eric Lewis mother is alive at age 51 without cancer. There are two maternal aunts. There is no known cancer among maternal aunts/uncles or maternal cousins. Eric Lewis maternal grandmother died in her late 36s without cancer. He does not have information about his maternal grandfather.   Eric Lewis father is alive at age 47 without cancer. There is one paternal uncle, who was diagnosed with prostate cancer around age 41. There is no known cancer among paternal aunts/uncles or paternal cousins. Eric Lewis paternal grandmother died in her 38s with breast cancer. His paternal grandfather died at age 56 without cancer.  Eric Lewis is unaware of previous family history of genetic testing for hereditary cancer risks. Patient's ancestors are of unknown descent. There is no reported Ashkenazi Jewish ancestry. There is no known consanguinity.  GENETIC TEST RESULTS: Genetic testing reported out on 03/20/2021 through the [facility] CancerNext-Expanded + RNAinsight panel. No pathogenic variants were detected.   The CancerNext-Expanded + RNAinsight gene panel offered by [employer] and includes sequencing and rearrangement analysis for the following 77 genes: AIP, ALK, APC, ATM, AXIN2, BAP1, BARD1, BLM, BMPR1A, BRCA1, BRCA2, BRIP1, CDC73, CDH1, CDK4, CDKN1B, CDKN2A, CHEK2, CTNNA1, DICER1, FANCC, FH, FLCN, GALNT12, KIF1B, LZTR1, MAX, MEN1, MET, MLH1, MSH2, MSH3, MSH6, MUTYH,  NBN, NF1, NF2, NTHL1, PALB2, PHOX2B, PMS2, POT1, PRKAR1A, PTCH1, PTEN, RAD51C, RAD51D, RB1, RECQL, RET, SDHA, SDHAF2, SDHB, SDHC, SDHD, SMAD4, SMARCA4,  SMARCB1, SMARCE1, STK11, SUFU, TMEM127, TP53, TSC1, TSC2, VHL and XRCC2 (sequencing and deletion/duplication); EGFR, EGLN1, HOXB13, KIT, MITF, PDGFRA, POLD1 and POLE (sequencing only); EPCAM and GREM1 (deletion/duplication only). RNA data is routinely analyzed for use in variant interpretation for all genes. The test report will be scanned into EPIC and located under the Molecular Pathology section of the Results Review tab.  A portion of the result report is included below for reference.     We discussed with Eric Lewis that because current genetic testing is not perfect, it is possible there may be a gene mutation in one of these genes that current testing cannot detect, but that chance is small.  We also discussed that there could be another gene that has not yet been discovered, or that we have not yet tested, that is responsible for the cancer diagnoses in the family. It is also possible there is a hereditary cause for the cancer in the family that Eric Lewis did not inherit and therefore was not identified in his testing.  Therefore, it is important to remain in touch with cancer genetics in the future so that we can continue to offer Eric Lewis the most up to date genetic testing.   CANCER SCREENING RECOMMENDATIONS: Eric Lewis test result is considered negative (normal).  This means that we have not identified a hereditary cause for his personal and family history of cancer at this time. Most cancers happen by chance and this negative test suggests that his personal and family history of cancer may fall into this category.   While reassuring, this does not definitively rule out a hereditary predisposition to cancer. It is still possible that there could be genetic mutations that are undetectable by current technology. There could be genetic mutations in genes that have not been tested or identified to increase cancer risk.  Therefore, it is recommended he continue to follow the cancer management and  screening guidelines provided by his oncology and primary healthcare provider.   An individual's cancer risk and medical management are not determined by genetic test results alone. Overall cancer risk assessment incorporates additional factors, including personal medical history, family history, and any available genetic information that may result in a personalized plan for cancer prevention and surveillance.  RECOMMENDATIONS FOR FAMILY MEMBERS:  Individuals in this family might be at some increased risk of developing cancer, over the general population risk, simply due to the family history of cancer.  We recommended women in this family have a yearly mammogram beginning at age 56, or 43 years younger than the earliest onset of cancer, an annual clinical breast exam, and perform monthly breast self-exams. Women in this family should also have a gynecological exam as recommended by their primary provider. All family members should be referred for colonoscopy starting at age 72.  The NCCN Guidelines (Colorectal Cancer Screening Guidelines, Version 1.2022) recommend that all first-degree relatives should have a colonoscopy every 5 years, or as determined by their GI doctors, beginning 10 years younger than the earliest diagnosis of colorectal cancer in the family.  FOLLOW-UP: Lastly, we discussed with Eric Lewis that cancer genetics is a rapidly advancing field and it is possible that new genetic tests will be appropriate for him and/or his family members in the future. We encouraged him to remain in contact with cancer genetics on an annual basis so  we can update his personal and family histories and let him know of advances in cancer genetics that may benefit this family.   Our contact number was provided. Eric Lewis questions were answered to his satisfaction, and he knows he is welcome to call us at anytime with additional questions or concerns.   Clint Guy, MS, [facility] Genetic  Counselor [email or web address] Phone: [phone]

## 2021-03-22 NOTE — Telephone Encounter (Signed)
Revealed negative genetic testing. Discussed that we do not know why he has rectal cancer or why there is cancer in the family. It is possible that there could be a mutation in a different gene that we are not testing, or our current technology may not be able to detect certain mutations. It will therefore be important for him to stay in contact with genetics to keep up with whether additional testing may be appropriate in the future.

## 2021-03-22 NOTE — Patient Instructions (Signed)

## 2021-03-24 ENCOUNTER — Inpatient Hospital Stay: Payer: BC Managed Care – PPO

## 2021-03-24 ENCOUNTER — Other Ambulatory Visit: Payer: Self-pay

## 2021-03-24 VITALS — BP 125/85 | HR 84 | Temp 98.1°F | Resp 20

## 2021-03-24 DIAGNOSIS — E119 Type 2 diabetes mellitus without complications: Secondary | ICD-10-CM | POA: Diagnosis not present

## 2021-03-24 DIAGNOSIS — Z8616 Personal history of COVID-19: Secondary | ICD-10-CM | POA: Diagnosis not present

## 2021-03-24 DIAGNOSIS — C20 Malignant neoplasm of rectum: Secondary | ICD-10-CM | POA: Diagnosis not present

## 2021-03-24 DIAGNOSIS — Z5111 Encounter for antineoplastic chemotherapy: Secondary | ICD-10-CM | POA: Diagnosis not present

## 2021-03-24 DIAGNOSIS — Z452 Encounter for adjustment and management of vascular access device: Secondary | ICD-10-CM | POA: Diagnosis not present

## 2021-03-24 MED ORDER — HEPARIN SOD (PORK) LOCK FLUSH 100 UNIT/ML IV SOLN
500.0000 [IU] | Freq: Once | INTRAVENOUS | Status: AC | PRN
  Administered 2021-03-24: 500 [IU]
  Filled 2021-03-24: qty 5

## 2021-03-24 MED ORDER — SODIUM CHLORIDE 0.9% FLUSH
10.0000 mL | INTRAVENOUS | Status: DC | PRN
  Administered 2021-03-24: 10 mL
  Filled 2021-03-24: qty 10

## 2021-03-29 ENCOUNTER — Other Ambulatory Visit: Payer: Self-pay | Admitting: Oncology

## 2021-04-04 ENCOUNTER — Other Ambulatory Visit: Payer: Self-pay | Admitting: Oncology

## 2021-04-05 ENCOUNTER — Inpatient Hospital Stay: Payer: BC Managed Care – PPO

## 2021-04-05 ENCOUNTER — Encounter: Payer: Self-pay | Admitting: Oncology

## 2021-04-05 ENCOUNTER — Other Ambulatory Visit: Payer: Self-pay

## 2021-04-05 ENCOUNTER — Inpatient Hospital Stay (HOSPITAL_BASED_OUTPATIENT_CLINIC_OR_DEPARTMENT_OTHER): Payer: BC Managed Care – PPO | Admitting: Oncology

## 2021-04-05 VITALS — BP 146/90 | HR 95 | Temp 97.8°F | Resp 18 | Ht 67.0 in | Wt 247.8 lb

## 2021-04-05 DIAGNOSIS — C20 Malignant neoplasm of rectum: Secondary | ICD-10-CM

## 2021-04-05 DIAGNOSIS — E119 Type 2 diabetes mellitus without complications: Secondary | ICD-10-CM | POA: Diagnosis not present

## 2021-04-05 DIAGNOSIS — Z452 Encounter for adjustment and management of vascular access device: Secondary | ICD-10-CM | POA: Diagnosis not present

## 2021-04-05 DIAGNOSIS — Z5111 Encounter for antineoplastic chemotherapy: Secondary | ICD-10-CM | POA: Diagnosis not present

## 2021-04-05 DIAGNOSIS — Z8616 Personal history of COVID-19: Secondary | ICD-10-CM | POA: Diagnosis not present

## 2021-04-05 LAB — CBC WITH DIFFERENTIAL (CANCER CENTER ONLY)
Abs Immature Granulocytes: 0.02 10*3/uL (ref 0.00–0.07)
Basophils Absolute: 0.1 10*3/uL (ref 0.0–0.1)
Basophils Relative: 1 %
Eosinophils Absolute: 0.1 10*3/uL (ref 0.0–0.5)
Eosinophils Relative: 2 %
HCT: 45.3 % (ref 39.0–52.0)
Hemoglobin: 14.6 g/dL (ref 13.0–17.0)
Immature Granulocytes: 0 %
Lymphocytes Relative: 29 %
Lymphs Abs: 2.3 10*3/uL (ref 0.7–4.0)
MCH: 28.3 pg (ref 26.0–34.0)
MCHC: 32.2 g/dL (ref 30.0–36.0)
MCV: 87.8 fL (ref 80.0–100.0)
Monocytes Absolute: 0.8 10*3/uL (ref 0.1–1.0)
Monocytes Relative: 9 %
Neutro Abs: 4.8 10*3/uL (ref 1.7–7.7)
Neutrophils Relative %: 59 %
Platelet Count: 224 10*3/uL (ref 150–400)
RBC: 5.16 MIL/uL (ref 4.22–5.81)
RDW: 13.3 % (ref 11.5–15.5)
WBC Count: 8.1 10*3/uL (ref 4.0–10.5)
nRBC: 0 % (ref 0.0–0.2)

## 2021-04-05 LAB — CMP (CANCER CENTER ONLY)
ALT: 25 U/L (ref 0–44)
AST: 12 U/L — ABNORMAL LOW (ref 15–41)
Albumin: 4.1 g/dL (ref 3.5–5.0)
Alkaline Phosphatase: 75 U/L (ref 38–126)
Anion gap: 9 (ref 5–15)
BUN: 13 mg/dL (ref 6–20)
CO2: 24 mmol/L (ref 22–32)
Calcium: 8.8 mg/dL — ABNORMAL LOW (ref 8.9–10.3)
Chloride: 105 mmol/L (ref 98–111)
Creatinine: 0.88 mg/dL (ref 0.61–1.24)
GFR, Estimated: >60 mL/min (ref >60)
Glucose, Bld: 126 mg/dL — ABNORMAL HIGH (ref 70–99)
Potassium: 4.2 mmol/L (ref 3.5–5.1)
Sodium: 138 mmol/L (ref 135–145)
Total Bilirubin: 0.5 mg/dL (ref 0.3–1.2)
Total Protein: 6.3 g/dL — ABNORMAL LOW (ref 6.5–8.1)

## 2021-04-05 MED ORDER — FLUOROURACIL CHEMO INJECTION 2.5 GM/50ML
400.0000 mg/m2 | Freq: Once | INTRAVENOUS | Status: AC
  Administered 2021-04-05: 950 mg via INTRAVENOUS
  Filled 2021-04-05: qty 19

## 2021-04-05 MED ORDER — LEUCOVORIN CALCIUM INJECTION 350 MG
400.0000 mg/m2 | Freq: Once | INTRAVENOUS | Status: AC
  Administered 2021-04-05: 932 mg via INTRAVENOUS
  Filled 2021-04-05: qty 46.6

## 2021-04-05 MED ORDER — PALONOSETRON HCL INJECTION 0.25 MG/5ML
0.2500 mg | Freq: Once | INTRAVENOUS | Status: AC
  Administered 2021-04-05: 0.25 mg via INTRAVENOUS
  Filled 2021-04-05: qty 5

## 2021-04-05 MED ORDER — DEXTROSE 5 % IV SOLN
Freq: Once | INTRAVENOUS | Status: AC
Start: 2021-04-05 — End: 2021-04-05
  Filled 2021-04-05: qty 250

## 2021-04-05 MED ORDER — OXALIPLATIN CHEMO INJECTION 100 MG/20ML
85.0000 mg/m2 | Freq: Once | INTRAVENOUS | Status: AC
  Administered 2021-04-05: 200 mg via INTRAVENOUS
  Filled 2021-04-05: qty 40

## 2021-04-05 MED ORDER — SODIUM CHLORIDE 0.9 % IV SOLN
2400.0000 mg/m2 | INTRAVENOUS | Status: DC
  Administered 2021-04-05: 5600 mg via INTRAVENOUS
  Filled 2021-04-05: qty 112

## 2021-04-05 MED ORDER — DEXAMETHASONE SODIUM PHOSPHATE 100 MG/10ML IJ SOLN
10.0000 mg | Freq: Once | INTRAMUSCULAR | Status: AC
  Administered 2021-04-05: 10 mg via INTRAVENOUS
  Filled 2021-04-05: qty 1

## 2021-04-05 MED ORDER — PALONOSETRON HCL INJECTION 0.25 MG/5ML: 0.2500 mg | Freq: Once | INTRAVENOUS | Status: DC

## 2021-04-05 NOTE — Patient Instructions (Signed)
[facility]  Discharge Instructions: Thank you for choosing [facility] to provide your oncology and hematology care.   If you have a lab appointment with the [facility], please go directly to the [facility] and check in at the registration area.   Wear comfortable clothing and clothing appropriate for easy access to any Portacath or PICC line.   We strive to give you quality time with your provider. You may need to reschedule your appointment if you arrive late (15 or more minutes).  Arriving late affects you and other patients whose appointments are after yours.  Also, if you miss three or more appointments without notifying the office, you may be dismissed from the clinic at the provider's discretion.      For prescription refill requests, have your pharmacy contact our office and allow 72 hours for refills to be completed.    Today you received the following chemotherapy and/or immunotherapy agents Oxaliplatin, Leucovorin, 5 FU   To help prevent nausea and vomiting after your treatment, we encourage you to take your nausea medication as directed.  BELOW ARE SYMPTOMS THAT SHOULD BE REPORTED IMMEDIATELY: . *FEVER GREATER THAN 100.4 F (38 C) OR HIGHER . *CHILLS OR SWEATING . *NAUSEA AND VOMITING THAT IS NOT CONTROLLED WITH YOUR NAUSEA MEDICATION . *UNUSUAL SHORTNESS OF BREATH . *UNUSUAL BRUISING OR BLEEDING . *URINARY PROBLEMS (pain or burning when urinating, or frequent urination) . *BOWEL PROBLEMS (unusual diarrhea, constipation, pain near the anus) . TENDERNESS IN MOUTH AND THROAT WITH OR WITHOUT PRESENCE OF ULCERS (sore throat, sores in mouth, or a toothache) . UNUSUAL RASH, SWELLING OR PAIN  . UNUSUAL VAGINAL DISCHARGE OR ITCHING   Items with * indicate a potential emergency and should be followed up as soon as possible or go to the Emergency Department if any problems should occur.  Please show the CHEMOTHERAPY ALERT CARD or  IMMUNOTHERAPY ALERT CARD at check-in to the Emergency Department and triage nurse.  Should you have questions after your visit or need to cancel or reschedule your appointment, please contact [facility]  Dept: [phone]  and follow the prompts.  Office hours are 8:00 a.m. to 4:30 p.m. Monday - Friday. Please note that voicemails left after 4:00 p.m. may not be returned until the following business day.  We are closed weekends and major holidays. You have access to a nurse at all times for urgent questions. Please call the main number to the clinic Dept: [phone] and follow the prompts.   For any non-urgent questions, you may also contact your provider using MyChart. We now offer e-Visits for anyone 47 and older to request care online for non-urgent symptoms. For details visit [email or web address].   Also download the MyChart app! Go to the app store, search "MyChart", open the app, select [facility], and log in with your MyChart username and password.  Due to Covid, a mask is required upon entering the hospital/clinic. If you do not have a mask, one will be given to you upon arrival. For doctor visits, patients may have 1 support person aged 43 or older with them. For treatment visits, patients cannot have anyone with them due to current Covid guidelines and our immunocompromised population.   Oxaliplatin Injection What is this medicine? OXALIPLATIN (ox AL i PLA tin) is a chemotherapy drug. It targets fast dividing cells, like cancer cells, and causes these cells to die. This medicine is used to treat cancers of the  colon and rectum, and many other cancers. This medicine may be used for other purposes; ask your health care provider or pharmacist if you have questions. COMMON BRAND NAME(S): Eloxatin What should I tell my health care provider before I take this medicine? They need to know if you have any of these conditions:  heart disease  history of  irregular heartbeat  liver disease  low blood counts, like white cells, platelets, or red blood cells  lung or breathing disease, like asthma  take medicines that treat or prevent blood clots  tingling of the fingers or toes, or other nerve disorder  an unusual or allergic reaction to oxaliplatin, other chemotherapy, other medicines, foods, dyes, or preservatives  pregnant or trying to get pregnant  breast-feeding How should I use this medicine? This drug is given as an infusion into a vein. It is administered in a hospital or clinic by a specially trained health care professional. Talk to your pediatrician regarding the use of this medicine in children. Special care may be needed. Overdosage: If you think you have taken too much of this medicine contact a poison control center or emergency room at once. NOTE: This medicine is only for you. Do not share this medicine with others. What if I miss a dose? It is important not to miss a dose. Call your doctor or health care professional if you are unable to keep an appointment. What may interact with this medicine? Do not take this medicine with any of the following medications:  cisapride  dronedarone  pimozide  thioridazine This medicine may also interact with the following medications:  aspirin and aspirin-like medicines  certain medicines that treat or prevent blood clots like warfarin, apixaban, dabigatran, and rivaroxaban  cisplatin  cyclosporine  diuretics  medicines for infection like acyclovir, adefovir, amphotericin B, bacitracin, cidofovir, foscarnet, ganciclovir, gentamicin, pentamidine, vancomycin  NSAIDs, medicines for pain and inflammation, like ibuprofen or naproxen  other medicines that prolong the QT interval (an abnormal heart rhythm)  pamidronate  zoledronic acid This list may not describe all possible interactions. Give your health care provider a list of all the medicines, herbs,  non-prescription drugs, or dietary supplements you use. Also tell them if you smoke, drink alcohol, or use illegal drugs. Some items may interact with your medicine. What should I watch for while using this medicine? Your condition will be monitored carefully while you are receiving this medicine. You may need blood work done while you are taking this medicine. This medicine may make you feel generally unwell. This is not uncommon as chemotherapy can affect healthy cells as well as cancer cells. Report any side effects. Continue your course of treatment even though you feel ill unless your healthcare professional tells you to stop. This medicine can make you more sensitive to cold. Do not drink cold drinks or use ice. Cover exposed skin before coming in contact with cold temperatures or cold objects. When out in cold weather wear warm clothing and cover your mouth and nose to warm the air that goes into your lungs. Tell your doctor if you get sensitive to the cold. Do not become pregnant while taking this medicine or for 9 months after stopping it. Women should inform their health care professional if they wish to become pregnant or think they might be pregnant. Men should not father a child while taking this medicine and for 6 months after stopping it. There is potential for serious side effects to an unborn child. Talk to your  health care professional for more information. Do not breast-feed a child while taking this medicine or for 3 months after stopping it. This medicine has caused ovarian failure in some women. This medicine may make it more difficult to get pregnant. Talk to your health care professional if you are concerned about your fertility. This medicine has caused decreased sperm counts in some men. This may make it more difficult to father a child. Talk to your health care professional if you are concerned about your fertility. This medicine may increase your risk of getting an infection.  Call your health care professional for advice if you get a fever, chills, or sore throat, or other symptoms of a cold or flu. Do not treat yourself. Try to avoid being around people who are sick. Avoid taking medicines that contain aspirin, acetaminophen, ibuprofen, naproxen, or ketoprofen unless instructed by your health care professional. These medicines may hide a fever. Be careful brushing or flossing your teeth or using a toothpick because you may get an infection or bleed more easily. If you have any dental work done, tell your dentist you are receiving this medicine. What side effects may I notice from receiving this medicine? Side effects that you should report to your doctor or health care professional as soon as possible:  allergic reactions like skin rash, itching or hives, swelling of the face, lips, or tongue  breathing problems  cough  low blood counts - this medicine may decrease the number of white blood cells, red blood cells, and platelets. You may be at increased risk for infections and bleeding  nausea, vomiting  pain, redness, or irritation at site where injected  pain, tingling, numbness in the hands or feet  signs and symptoms of bleeding such as bloody or black, tarry stools; red or dark brown urine; spitting up blood or brown material that looks like coffee grounds; red spots on the skin; unusual bruising or bleeding from the eyes, gums, or nose  signs and symptoms of a dangerous change in heartbeat or heart rhythm like chest pain; dizziness; fast, irregular heartbeat; palpitations; feeling faint or lightheaded; falls  signs and symptoms of infection like fever; chills; cough; sore throat; pain or trouble passing urine  signs and symptoms of liver injury like dark yellow or brown urine; general ill feeling or flu-like symptoms; light-colored stools; loss of appetite; nausea; right upper belly pain; unusually weak or tired; yellowing of the eyes or skin  signs and  symptoms of low red blood cells or anemia such as unusually weak or tired; feeling faint or lightheaded; falls  signs and symptoms of muscle injury like dark urine; trouble passing urine or change in the amount of urine; unusually weak or tired; muscle pain; back pain Side effects that usually do not require medical attention (report to your doctor or health care professional if they continue or are bothersome):  changes in taste  diarrhea  gas  hair loss  loss of appetite  mouth sores This list may not describe all possible side effects. Call your doctor for medical advice about side effects. You may report side effects to FDA at 1-800-FDA-1088. Where should I keep my medicine? This drug is given in a hospital or clinic and will not be stored at home. NOTE: This sheet is a summary. It may not cover all possible information. If you have questions about this medicine, talk to your doctor, pharmacist, or health care provider.  2021 Elsevier/Gold Standard (2019-03-12 12:20:35)  Leucovorin injection What  is this medicine? LEUCOVORIN (loo koe VOR in) is used to prevent or treat the harmful effects of some medicines. This medicine is used to treat anemia caused by a low amount of folic acid in the body. It is also used with 5-fluorouracil (5-FU) to treat colon cancer. This medicine may be used for other purposes; ask your health care provider or pharmacist if you have questions. What should I tell my health care provider before I take this medicine? They need to know if you have any of these conditions:  anemia from low levels of vitamin B-12 in the blood  an unusual or allergic reaction to leucovorin, folic acid, other medicines, foods, dyes, or preservatives  pregnant or trying to get pregnant  breast-feeding How should I use this medicine? This medicine is for injection into a muscle or into a vein. It is given by a health care professional in a hospital or clinic setting. Talk to  your pediatrician regarding the use of this medicine in children. Special care may be needed. Overdosage: If you think you have taken too much of this medicine contact a poison control center or emergency room at once. NOTE: This medicine is only for you. Do not share this medicine with others. What if I miss a dose? This does not apply. What may interact with this medicine?  capecitabine  fluorouracil  phenobarbital  phenytoin  primidone  trimethoprim-sulfamethoxazole This list may not describe all possible interactions. Give your health care provider a list of all the medicines, herbs, non-prescription drugs, or dietary supplements you use. Also tell them if you smoke, drink alcohol, or use illegal drugs. Some items may interact with your medicine. What should I watch for while using this medicine? Your condition will be monitored carefully while you are receiving this medicine. This medicine may increase the side effects of 5-fluorouracil, 5-FU. Tell your doctor or health care professional if you have diarrhea or mouth sores that do not get better or that get worse. What side effects may I notice from receiving this medicine? Side effects that you should report to your doctor or health care professional as soon as possible:  allergic reactions like skin rash, itching or hives, swelling of the face, lips, or tongue  breathing problems  fever, infection  mouth sores  unusual bleeding or bruising  unusually weak or tired Side effects that usually do not require medical attention (report to your doctor or health care professional if they continue or are bothersome):  constipation or diarrhea  loss of appetite  nausea, vomiting This list may not describe all possible side effects. Call your doctor for medical advice about side effects. You may report side effects to FDA at 1-800-FDA-1088. Where should I keep my medicine? This drug is given in a hospital or clinic and will  not be stored at home. NOTE: This sheet is a summary. It may not cover all possible information. If you have questions about this medicine, talk to your doctor, pharmacist, or health care provider.  2021 Elsevier/Gold Standard (2008-04-28 16:50:29)  Fluorouracil, 5-FU injection What is this medicine? FLUOROURACIL, 5-FU (flure oh YOOR a sil) is a chemotherapy drug. It slows the growth of cancer cells. This medicine is used to treat many types of cancer like breast cancer, colon or rectal cancer, pancreatic cancer, and stomach cancer. This medicine may be used for other purposes; ask your health care provider or pharmacist if you have questions. COMMON BRAND NAME(S): Adrucil What should I tell my health  care provider before I take this medicine? They need to know if you have any of these conditions:  blood disorders  dihydropyrimidine dehydrogenase (DPD) deficiency  infection (especially a virus infection such as chickenpox, cold sores, or herpes)  kidney disease  liver disease  malnourished, poor nutrition  recent or ongoing radiation therapy  an unusual or allergic reaction to fluorouracil, other chemotherapy, other medicines, foods, dyes, or preservatives  pregnant or trying to get pregnant  breast-feeding How should I use this medicine? This drug is given as an infusion or injection into a vein. It is administered in a hospital or clinic by a specially trained health care professional. Talk to your pediatrician regarding the use of this medicine in children. Special care may be needed. Overdosage: If you think you have taken too much of this medicine contact a poison control center or emergency room at once. NOTE: This medicine is only for you. Do not share this medicine with others. What if I miss a dose? It is important not to miss your dose. Call your doctor or health care professional if you are unable to keep an appointment. What may interact with this medicine? Do not  take this medicine with any of the following medications:  live virus vaccines This medicine may also interact with the following medications:  medicines that treat or prevent blood clots like warfarin, enoxaparin, and dalteparin This list may not describe all possible interactions. Give your health care provider a list of all the medicines, herbs, non-prescription drugs, or dietary supplements you use. Also tell them if you smoke, drink alcohol, or use illegal drugs. Some items may interact with your medicine. What should I watch for while using this medicine? Visit your doctor for checks on your progress. This drug may make you feel generally unwell. This is not uncommon, as chemotherapy can affect healthy cells as well as cancer cells. Report any side effects. Continue your course of treatment even though you feel ill unless your doctor tells you to stop. In some cases, you may be given additional medicines to help with side effects. Follow all directions for their use. Call your doctor or health care professional for advice if you get a fever, chills or sore throat, or other symptoms of a cold or flu. Do not treat yourself. This drug decreases your body's ability to fight infections. Try to avoid being around people who are sick. This medicine may increase your risk to bruise or bleed. Call your doctor or health care professional if you notice any unusual bleeding. Be careful brushing and flossing your teeth or using a toothpick because you may get an infection or bleed more easily. If you have any dental work done, tell your dentist you are receiving this medicine. Avoid taking products that contain aspirin, acetaminophen, ibuprofen, naproxen, or ketoprofen unless instructed by your doctor. These medicines may hide a fever. Do not become pregnant while taking this medicine. Women should inform their doctor if they wish to become pregnant or think they might be pregnant. There is a potential for  serious side effects to an unborn child. Talk to your health care professional or pharmacist for more information. Do not breast-feed an infant while taking this medicine. Men should inform their doctor if they wish to father a child. This medicine may lower sperm counts. Do not treat diarrhea with over the counter products. Contact your doctor if you have diarrhea that lasts more than 2 days or if it is severe and watery.  This medicine can make you more sensitive to the sun. Keep out of the sun. If you cannot avoid being in the sun, wear protective clothing and use sunscreen. Do not use sun lamps or tanning beds/booths. What side effects may I notice from receiving this medicine? Side effects that you should report to your doctor or health care professional as soon as possible:  allergic reactions like skin rash, itching or hives, swelling of the face, lips, or tongue  low blood counts - this medicine may decrease the number of white blood cells, red blood cells and platelets. You may be at increased risk for infections and bleeding.  signs of infection - fever or chills, cough, sore throat, pain or difficulty passing urine  signs of decreased platelets or bleeding - bruising, pinpoint red spots on the skin, black, tarry stools, blood in the urine  signs of decreased red blood cells - unusually weak or tired, fainting spells, lightheadedness  breathing problems  changes in vision  chest pain  mouth sores  nausea and vomiting  pain, swelling, redness at site where injected  pain, tingling, numbness in the hands or feet  redness, swelling, or sores on hands or feet  stomach pain  unusual bleeding Side effects that usually do not require medical attention (report to your doctor or health care professional if they continue or are bothersome):  changes in finger or toe nails  diarrhea  dry or itchy skin  hair loss  headache  loss of appetite  sensitivity of eyes to the  light  stomach upset  unusually teary eyes This list may not describe all possible side effects. Call your doctor for medical advice about side effects. You may report side effects to FDA at 1-800-FDA-1088. Where should I keep my medicine? This drug is given in a hospital or clinic and will not be stored at home. NOTE: This sheet is a summary. It may not cover all possible information. If you have questions about this medicine, talk to your doctor, pharmacist, or health care provider.  2021 Elsevier/Gold Standard (2019-09-23 15:00:03)

## 2021-04-05 NOTE — Progress Notes (Signed)
  [facility] OFFICE PROGRESS NOTE   Diagnosis: Rectal cancer  INTERVAL HISTORY:   Eric Lewis completed cycle 1 FOLFOX on 03/22/2021.  No nausea/vomiting, mouth sores, or diarrhea.  He reports cold sensitivity for a few days following chemotherapy.  No neuropathy symptoms at present.  He developed a "sore "at the anus a few days after chemotherapy.  This has resolved.  Objective:  Vital signs in last 24 hours:  Blood pressure (!) 146/90, pulse 95, temperature 97.8 F (36.6 C), temperature source Oral, resp. rate 18, height 5\' 7"  (1.702 m), weight 247 lb 12.8 oz (112.4 kg), SpO2 97 %.    HEENT: No thrush or ulcers Resp: Lungs clear bilaterally Cardio: Regular rate and rhythm GI: No hepatosplenomegaly, nontender Vascular: No leg edema Skin: Palms without erythema  Portacath/PICC-without erythema  Lab Results:  Lab Results  Component Value Date   WBC 8.1 04/05/2021   HGB 14.6 04/05/2021   HCT 45.3 04/05/2021   MCV 87.8 04/05/2021   PLT 224 04/05/2021   NEUTROABS 4.8 04/05/2021    CMP  Lab Results  Component Value Date   NA 138 04/05/2021   K 4.2 04/05/2021   CL 105 04/05/2021   CO2 24 04/05/2021   GLUCOSE 126 (H) 04/05/2021   BUN 13 04/05/2021   CREATININE 0.88 04/05/2021   CALCIUM 8.8 (L) 04/05/2021   PROT 6.3 (L) 04/05/2021   ALBUMIN 4.1 04/05/2021   AST 12 (L) 04/05/2021   ALT 25 04/05/2021   ALKPHOS 75 04/05/2021   BILITOT 0.5 04/05/2021   GFRNONAA >60 04/05/2021   GFRAA >60 05/30/2016    Medications: I have reviewed the patient's current medications.   Assessment/Plan: 1. Rectal cancer  Colonoscopy 02/15/2021- nonobstructing mass at 2-3 cm in the anal verge, removed in a piecemeal fashion, pathology confirmed moderately differentiated adenocarcinoma  CTs chest, abdomen, and pelvis on 02/17/2021- no evidence of metastatic disease, no clear mass identified, areas of mild rectal wall thickening  MRI 03/02/2021- rectal tumor not  identified, no extension beyond the muscularis identified.  N1 disease with multiple left mesorectal nodes, and a 7 mm node beyond the mesorectum at the hypogastric region  Cycle 1 FOLFOX 03/22/2021  Cycle 2 FOLFOX 04/05/2021  2. History of dysphagia-status post a barium swallow 10/22/2020- mild smooth stricture at the GE junction 3. Psoriatic arthritis 4. Diabetes 5. COVID-19 January 22    Disposition: Eric Lewis tolerated the first cycle of FOLFOX well.  He will complete cycle 2 today.  The plan is to complete total neoadjuvant therapy prior to rectal surgery.  He will return for an office visit and chemotherapy in 2 weeks.  Betsy Coder, MD  04/05/2021  11:43 AM

## 2021-04-07 ENCOUNTER — Other Ambulatory Visit: Payer: Self-pay

## 2021-04-07 ENCOUNTER — Inpatient Hospital Stay: Payer: BC Managed Care – PPO | Attending: Oncology

## 2021-04-07 VITALS — BP 137/85 | HR 104 | Resp 18

## 2021-04-07 DIAGNOSIS — C20 Malignant neoplasm of rectum: Secondary | ICD-10-CM | POA: Insufficient documentation

## 2021-04-07 DIAGNOSIS — Z5111 Encounter for antineoplastic chemotherapy: Secondary | ICD-10-CM | POA: Insufficient documentation

## 2021-04-07 DIAGNOSIS — E119 Type 2 diabetes mellitus without complications: Secondary | ICD-10-CM | POA: Insufficient documentation

## 2021-04-07 DIAGNOSIS — L4050 Arthropathic psoriasis, unspecified: Secondary | ICD-10-CM | POA: Insufficient documentation

## 2021-04-07 DIAGNOSIS — Z79899 Other long term (current) drug therapy: Secondary | ICD-10-CM | POA: Insufficient documentation

## 2021-04-07 DIAGNOSIS — Z8042 Family history of malignant neoplasm of prostate: Secondary | ICD-10-CM | POA: Insufficient documentation

## 2021-04-07 DIAGNOSIS — Z803 Family history of malignant neoplasm of breast: Secondary | ICD-10-CM | POA: Diagnosis not present

## 2021-04-07 DIAGNOSIS — Z836 Family history of other diseases of the respiratory system: Secondary | ICD-10-CM | POA: Insufficient documentation

## 2021-04-07 DIAGNOSIS — Z8616 Personal history of COVID-19: Secondary | ICD-10-CM | POA: Insufficient documentation

## 2021-04-07 MED ORDER — SODIUM CHLORIDE 0.9% FLUSH
10.0000 mL | INTRAVENOUS | Status: DC | PRN
  Administered 2021-04-07: 10 mL
  Filled 2021-04-07: qty 10

## 2021-04-07 MED ORDER — HEPARIN SOD (PORK) LOCK FLUSH 100 UNIT/ML IV SOLN
500.0000 [IU] | Freq: Once | INTRAVENOUS | Status: AC | PRN
  Administered 2021-04-07: 500 [IU]
  Filled 2021-04-07: qty 5

## 2021-04-07 NOTE — Patient Instructions (Signed)

## 2021-04-12 DIAGNOSIS — E119 Type 2 diabetes mellitus without complications: Secondary | ICD-10-CM | POA: Diagnosis not present

## 2021-04-12 DIAGNOSIS — C20 Malignant neoplasm of rectum: Secondary | ICD-10-CM | POA: Diagnosis not present

## 2021-04-17 ENCOUNTER — Other Ambulatory Visit: Payer: Self-pay | Admitting: Oncology

## 2021-04-19 ENCOUNTER — Inpatient Hospital Stay: Payer: BC Managed Care – PPO

## 2021-04-19 ENCOUNTER — Other Ambulatory Visit: Payer: Self-pay

## 2021-04-19 ENCOUNTER — Inpatient Hospital Stay (HOSPITAL_BASED_OUTPATIENT_CLINIC_OR_DEPARTMENT_OTHER): Payer: BC Managed Care – PPO | Admitting: Oncology

## 2021-04-19 VITALS — BP 131/95 | HR 100 | Temp 97.7°F | Resp 18 | Ht 67.0 in | Wt 247.0 lb

## 2021-04-19 DIAGNOSIS — L4050 Arthropathic psoriasis, unspecified: Secondary | ICD-10-CM | POA: Diagnosis not present

## 2021-04-19 DIAGNOSIS — Z5111 Encounter for antineoplastic chemotherapy: Secondary | ICD-10-CM | POA: Diagnosis not present

## 2021-04-19 DIAGNOSIS — C20 Malignant neoplasm of rectum: Secondary | ICD-10-CM

## 2021-04-19 DIAGNOSIS — Z803 Family history of malignant neoplasm of breast: Secondary | ICD-10-CM | POA: Diagnosis not present

## 2021-04-19 DIAGNOSIS — Z8042 Family history of malignant neoplasm of prostate: Secondary | ICD-10-CM | POA: Diagnosis not present

## 2021-04-19 DIAGNOSIS — Z836 Family history of other diseases of the respiratory system: Secondary | ICD-10-CM | POA: Diagnosis not present

## 2021-04-19 DIAGNOSIS — E119 Type 2 diabetes mellitus without complications: Secondary | ICD-10-CM | POA: Diagnosis not present

## 2021-04-19 DIAGNOSIS — Z8616 Personal history of COVID-19: Secondary | ICD-10-CM | POA: Diagnosis not present

## 2021-04-19 DIAGNOSIS — Z79899 Other long term (current) drug therapy: Secondary | ICD-10-CM | POA: Diagnosis not present

## 2021-04-19 LAB — CMP (CANCER CENTER ONLY)
ALT: 24 U/L (ref 0–44)
AST: 15 U/L (ref 15–41)
Albumin: 4.1 g/dL (ref 3.5–5.0)
Alkaline Phosphatase: 76 U/L (ref 38–126)
Anion gap: 9 (ref 5–15)
BUN: 15 mg/dL (ref 6–20)
CO2: 24 mmol/L (ref 22–32)
Calcium: 9 mg/dL (ref 8.9–10.3)
Chloride: 104 mmol/L (ref 98–111)
Creatinine: 0.88 mg/dL (ref 0.61–1.24)
GFR, Estimated: >60 mL/min (ref >60)
Glucose, Bld: 141 mg/dL — ABNORMAL HIGH (ref 70–99)
Potassium: 4 mmol/L (ref 3.5–5.1)
Sodium: 137 mmol/L (ref 135–145)
Total Bilirubin: 0.5 mg/dL (ref 0.3–1.2)
Total Protein: 6.7 g/dL (ref 6.5–8.1)

## 2021-04-19 LAB — CBC WITH DIFFERENTIAL (CANCER CENTER ONLY)
Abs Immature Granulocytes: 0.05 10*3/uL (ref 0.00–0.07)
Basophils Absolute: 0.1 10*3/uL (ref 0.0–0.1)
Basophils Relative: 1 %
Eosinophils Absolute: 0.2 10*3/uL (ref 0.0–0.5)
Eosinophils Relative: 2 %
HCT: 45.5 % (ref 39.0–52.0)
Hemoglobin: 14.6 g/dL (ref 13.0–17.0)
Immature Granulocytes: 1 %
Lymphocytes Relative: 26 %
Lymphs Abs: 2.3 10*3/uL (ref 0.7–4.0)
MCH: 27.9 pg (ref 26.0–34.0)
MCHC: 32.1 g/dL (ref 30.0–36.0)
MCV: 87 fL (ref 80.0–100.0)
Monocytes Absolute: 0.8 10*3/uL (ref 0.1–1.0)
Monocytes Relative: 10 %
Neutro Abs: 5.2 10*3/uL (ref 1.7–7.7)
Neutrophils Relative %: 60 %
Platelet Count: 184 10*3/uL (ref 150–400)
RBC: 5.23 MIL/uL (ref 4.22–5.81)
RDW: 14.1 % (ref 11.5–15.5)
WBC Count: 8.5 10*3/uL (ref 4.0–10.5)
nRBC: 0 % (ref 0.0–0.2)

## 2021-04-19 MED ORDER — SODIUM CHLORIDE 0.9 % IV SOLN
2400.0000 mg/m2 | INTRAVENOUS | Status: DC
  Administered 2021-04-19: 5600 mg via INTRAVENOUS
  Filled 2021-04-19: qty 112

## 2021-04-19 MED ORDER — OXALIPLATIN CHEMO INJECTION 100 MG/20ML
85.0000 mg/m2 | Freq: Once | INTRAVENOUS | Status: AC
  Administered 2021-04-19: 200 mg via INTRAVENOUS
  Filled 2021-04-19: qty 40

## 2021-04-19 MED ORDER — FLUOROURACIL CHEMO INJECTION 2.5 GM/50ML
400.0000 mg/m2 | Freq: Once | INTRAVENOUS | Status: AC
  Administered 2021-04-19: 950 mg via INTRAVENOUS
  Filled 2021-04-19: qty 19

## 2021-04-19 MED ORDER — DEXTROSE 5 % IV SOLN
Freq: Once | INTRAVENOUS | Status: AC
  Filled 2021-04-19: qty 250

## 2021-04-19 MED ORDER — DEXAMETHASONE SODIUM PHOSPHATE 100 MG/10ML IJ SOLN
10.0000 mg | Freq: Once | INTRAMUSCULAR | Status: AC
  Administered 2021-04-19: 10 mg via INTRAVENOUS
  Filled 2021-04-19: qty 1

## 2021-04-19 MED ORDER — LEUCOVORIN CALCIUM INJECTION 350 MG
400.0000 mg/m2 | Freq: Once | INTRAVENOUS | Status: AC
  Administered 2021-04-19: 932 mg via INTRAVENOUS
  Filled 2021-04-19: qty 46.6

## 2021-04-19 MED ORDER — PALONOSETRON HCL INJECTION 0.25 MG/5ML
0.2500 mg | Freq: Once | INTRAVENOUS | Status: AC
  Administered 2021-04-19: 0.25 mg via INTRAVENOUS
  Filled 2021-04-19: qty 5

## 2021-04-19 NOTE — Progress Notes (Signed)
  [facility] OFFICE VISIT PROGRESS NOTE  I connected with Eric Lewis on 04/19/21 at  8:40 AM EDT by video enabled telemedicine visit and verified that I am speaking with the correct person using two identifiers.   I discussed the limitations, risks, security and privacy concerns of performing an evaluation and management service by telemedicine and the availability of in-person appointments. I also discussed with the patient that there may be a patient responsible charge related to this service. The patient expressed understanding and agreed to proceed.   Patient's location: Office Provider's location: Home   Diagnosis: Rectal cancer  INTERVAL HISTORY:  Eric Lewis completed another cycle of FOLFOX 04/05/2021.  No nausea/vomiting, mouth sores, hand/foot pain, or diarrhea.  He reports cold sensitivity, especially in the hands, lasting for 1 week following chemotherapy.  No neuropathy symptoms at present.  He reports his blood sugar has been under good control.  No rectal bleeding.  Objective:  Vital signs in last 24 hours:  Blood pressure (!) 131/95, pulse 100, temperature 97.7 F (36.5 C), temperature source Oral, resp. rate 18, height 5\' 7"  (1.702 m), weight 247 lb (112 kg), SpO2 96 %.      Lab Results:  Lab Results  Component Value Date   WBC 8.1 04/05/2021   HGB 14.6 04/05/2021   HCT 45.3 04/05/2021   MCV 87.8 04/05/2021   PLT 224 04/05/2021   NEUTROABS 4.8 04/05/2021     Medications: I have reviewed the patient's current medications.  Assessment/Plan: Rectal cancer Colonoscopy 02/15/2021- nonobstructing mass at 2-3 cm in the anal verge, removed in a piecemeal fashion, pathology confirmed moderately differentiated adenocarcinoma CTs chest, abdomen, and pelvis on 02/17/2021- no evidence of metastatic disease, no clear mass identified, areas of mild rectal wall thickening MRI 03/02/2021- rectal tumor not identified, no  extension beyond the muscularis identified.  N1 disease with multiple left mesorectal nodes, and a 7 mm node beyond the mesorectum at the hypogastric region Cycle 1 FOLFOX 03/22/2021 Cycle 2 FOLFOX 04/05/2021 Cycle 3 FOLFOX 04/19/2021  History of dysphagia-status post a barium swallow 10/22/2020- mild smooth stricture at the GE junction Psoriatic arthritis Diabetes COVID-19 January 22     Disposition: Eric Lewis has completed 2 cycles of FOLFOX.  He has tolerated the chemotherapy well.  He will complete cycle 3 today.  His CBC is reviewed and is adequate to proceed with chemotherapy.  He will return for an office visit and cycle 4 chemotherapy in 2 weeks.   I discussed the assessment and treatment plan with the patient. The patient was provided an opportunity to ask questions and all were answered. The patient agreed with the plan and demonstrated an understanding of the instructions.   The patient was advised to call back or seek an in-person evaluation if the symptoms worsen or if the condition fails to improve as anticipated.  I provided 20 minutes of video, chart review, and documentation time during this encounter, and > 50% was spent counseling as documented under my assessment & plan.  Betsy Coder ANP/GNP-BC   04/19/2021 8:41 AM

## 2021-04-21 ENCOUNTER — Other Ambulatory Visit: Payer: Self-pay

## 2021-04-21 ENCOUNTER — Inpatient Hospital Stay: Payer: BC Managed Care – PPO

## 2021-04-21 VITALS — BP 126/88 | HR 95 | Temp 98.2°F | Resp 20

## 2021-04-21 DIAGNOSIS — E119 Type 2 diabetes mellitus without complications: Secondary | ICD-10-CM | POA: Diagnosis not present

## 2021-04-21 DIAGNOSIS — L4050 Arthropathic psoriasis, unspecified: Secondary | ICD-10-CM | POA: Diagnosis not present

## 2021-04-21 DIAGNOSIS — Z803 Family history of malignant neoplasm of breast: Secondary | ICD-10-CM | POA: Diagnosis not present

## 2021-04-21 DIAGNOSIS — Z79899 Other long term (current) drug therapy: Secondary | ICD-10-CM | POA: Diagnosis not present

## 2021-04-21 DIAGNOSIS — Z836 Family history of other diseases of the respiratory system: Secondary | ICD-10-CM | POA: Diagnosis not present

## 2021-04-21 DIAGNOSIS — C20 Malignant neoplasm of rectum: Secondary | ICD-10-CM

## 2021-04-21 DIAGNOSIS — Z8616 Personal history of COVID-19: Secondary | ICD-10-CM | POA: Diagnosis not present

## 2021-04-21 DIAGNOSIS — Z8042 Family history of malignant neoplasm of prostate: Secondary | ICD-10-CM | POA: Diagnosis not present

## 2021-04-21 DIAGNOSIS — Z5111 Encounter for antineoplastic chemotherapy: Secondary | ICD-10-CM | POA: Diagnosis not present

## 2021-04-21 MED ORDER — SODIUM CHLORIDE 0.9% FLUSH
10.0000 mL | INTRAVENOUS | Status: DC | PRN
  Administered 2021-04-21: 10 mL
  Filled 2021-04-21: qty 10

## 2021-04-21 MED ORDER — HEPARIN SOD (PORK) LOCK FLUSH 100 UNIT/ML IV SOLN
500.0000 [IU] | Freq: Once | INTRAVENOUS | Status: AC | PRN
  Administered 2021-04-21: 500 [IU]
  Filled 2021-04-21: qty 5

## 2021-04-21 NOTE — Patient Instructions (Signed)
Discharge Instructions: Thank you for choosing [facility] to provide your oncology and hematology care.   If you have a lab appointment with the [facility], please go directly to the [facility] and check in at the registration area.   Wear comfortable clothing and clothing appropriate for easy access to any Portacath or PICC line.   We strive to give you quality time with your provider. You may need to reschedule your appointment if you arrive late (15 or more minutes).  Arriving late affects you and other patients whose appointments are after yours.  Also, if you miss three or more appointments without notifying the office, you may be dismissed from the clinic at the provider's discretion.      For prescription refill requests, have your pharmacy contact our office and allow 72 hours for refills to be completed.    Today you received the following chemotherapy and/or immunotherapy agents: fluorouracil   To help prevent nausea and vomiting after your treatment, we encourage you to take your nausea medication as directed.  BELOW ARE SYMPTOMS THAT SHOULD BE REPORTED IMMEDIATELY: *FEVER GREATER THAN 100.4 F (38 C) OR HIGHER *CHILLS OR SWEATING *NAUSEA AND VOMITING THAT IS NOT CONTROLLED WITH YOUR NAUSEA MEDICATION *UNUSUAL SHORTNESS OF BREATH *UNUSUAL BRUISING OR BLEEDING *URINARY PROBLEMS (pain or burning when urinating, or frequent urination) *BOWEL PROBLEMS (unusual diarrhea, constipation, pain near the anus) TENDERNESS IN MOUTH AND THROAT WITH OR WITHOUT PRESENCE OF ULCERS (sore throat, sores in mouth, or a toothache) UNUSUAL RASH, SWELLING OR PAIN  UNUSUAL VAGINAL DISCHARGE OR ITCHING   Items with * indicate a potential emergency and should be followed up as soon as possible or go to the Emergency Department if any problems should occur.  Please show the CHEMOTHERAPY ALERT CARD or IMMUNOTHERAPY ALERT CARD at check-in to the Emergency Department and triage  nurse.  Should you have questions after your visit or need to cancel or reschedule your appointment, please contact [facility]  Dept: [phone]  and follow the prompts.  Office hours are 8:00 a.m. to 4:30 p.m. Monday - Friday. Please note that voicemails left after 4:00 p.m. may not be returned until the following business day.  We are closed weekends and major holidays. You have access to a nurse at all times for urgent questions. Please call the main number to the clinic Dept: [phone] and follow the prompts.   For any non-urgent questions, you may also contact your provider using MyChart. We now offer e-Visits for anyone 73 and older to request care online for non-urgent symptoms. For details visit [email or web address].   Also download the MyChart app! Go to the app store, search "MyChart", open the app, select [facility], and log in with your MyChart username and password.  Due to Covid, a mask is required upon entering the hospital/clinic. If you do not have a mask, one will be given to you upon arrival. For doctor visits, patients may have 1 support person aged 51 or older with them. For treatment visits, patients cannot have anyone with them due to current Covid guidelines and our immunocompromised population.

## 2021-04-22 DIAGNOSIS — C20 Malignant neoplasm of rectum: Secondary | ICD-10-CM | POA: Diagnosis not present

## 2021-05-01 ENCOUNTER — Other Ambulatory Visit: Payer: Self-pay | Admitting: Oncology

## 2021-05-03 ENCOUNTER — Inpatient Hospital Stay: Payer: BC Managed Care – PPO

## 2021-05-03 ENCOUNTER — Other Ambulatory Visit: Payer: Self-pay

## 2021-05-03 ENCOUNTER — Inpatient Hospital Stay (HOSPITAL_BASED_OUTPATIENT_CLINIC_OR_DEPARTMENT_OTHER): Payer: BC Managed Care – PPO | Admitting: Physician Assistant

## 2021-05-03 VITALS — BP 142/87 | HR 100 | Temp 97.8°F | Resp 18 | Ht 67.0 in | Wt 246.4 lb

## 2021-05-03 DIAGNOSIS — C20 Malignant neoplasm of rectum: Secondary | ICD-10-CM

## 2021-05-03 DIAGNOSIS — L4050 Arthropathic psoriasis, unspecified: Secondary | ICD-10-CM | POA: Diagnosis not present

## 2021-05-03 DIAGNOSIS — Z5111 Encounter for antineoplastic chemotherapy: Secondary | ICD-10-CM | POA: Insufficient documentation

## 2021-05-03 DIAGNOSIS — Z836 Family history of other diseases of the respiratory system: Secondary | ICD-10-CM | POA: Diagnosis not present

## 2021-05-03 DIAGNOSIS — Z8042 Family history of malignant neoplasm of prostate: Secondary | ICD-10-CM | POA: Diagnosis not present

## 2021-05-03 DIAGNOSIS — Z8616 Personal history of COVID-19: Secondary | ICD-10-CM | POA: Diagnosis not present

## 2021-05-03 DIAGNOSIS — E119 Type 2 diabetes mellitus without complications: Secondary | ICD-10-CM | POA: Diagnosis not present

## 2021-05-03 DIAGNOSIS — Z803 Family history of malignant neoplasm of breast: Secondary | ICD-10-CM | POA: Diagnosis not present

## 2021-05-03 DIAGNOSIS — Z79899 Other long term (current) drug therapy: Secondary | ICD-10-CM | POA: Diagnosis not present

## 2021-05-03 LAB — CBC WITH DIFFERENTIAL (CANCER CENTER ONLY)
Abs Immature Granulocytes: 0.03 10*3/uL (ref 0.00–0.07)
Basophils Absolute: 0.1 10*3/uL (ref 0.0–0.1)
Basophils Relative: 1 %
Eosinophils Absolute: 0.2 10*3/uL (ref 0.0–0.5)
Eosinophils Relative: 3 %
HCT: 45.4 % (ref 39.0–52.0)
Hemoglobin: 14.7 g/dL (ref 13.0–17.0)
Immature Granulocytes: 0 %
Lymphocytes Relative: 29 %
Lymphs Abs: 2 10*3/uL (ref 0.7–4.0)
MCH: 28.3 pg (ref 26.0–34.0)
MCHC: 32.4 g/dL (ref 30.0–36.0)
MCV: 87.5 fL (ref 80.0–100.0)
Monocytes Absolute: 0.8 10*3/uL (ref 0.1–1.0)
Monocytes Relative: 12 %
Neutro Abs: 3.8 10*3/uL (ref 1.7–7.7)
Neutrophils Relative %: 55 %
Platelet Count: 154 10*3/uL (ref 150–400)
RBC: 5.19 MIL/uL (ref 4.22–5.81)
RDW: 14.6 % (ref 11.5–15.5)
WBC Count: 6.9 10*3/uL (ref 4.0–10.5)
nRBC: 0 % (ref 0.0–0.2)

## 2021-05-03 LAB — CMP (CANCER CENTER ONLY)
ALT: 27 U/L (ref 0–44)
AST: 16 U/L (ref 15–41)
Albumin: 4.1 g/dL (ref 3.5–5.0)
Alkaline Phosphatase: 79 U/L (ref 38–126)
Anion gap: 9 (ref 5–15)
BUN: 13 mg/dL (ref 6–20)
CO2: 25 mmol/L (ref 22–32)
Calcium: 9.2 mg/dL (ref 8.9–10.3)
Chloride: 102 mmol/L (ref 98–111)
Creatinine: 0.9 mg/dL (ref 0.61–1.24)
GFR, Estimated: >60 mL/min (ref >60)
Glucose, Bld: 139 mg/dL — ABNORMAL HIGH (ref 70–99)
Potassium: 4.6 mmol/L (ref 3.5–5.1)
Sodium: 136 mmol/L (ref 135–145)
Total Bilirubin: 0.5 mg/dL (ref 0.3–1.2)
Total Protein: 6.7 g/dL (ref 6.5–8.1)

## 2021-05-03 MED ORDER — PALONOSETRON HCL INJECTION 0.25 MG/5ML
0.2500 mg | Freq: Once | INTRAVENOUS | Status: AC
  Administered 2021-05-03: 0.25 mg via INTRAVENOUS
  Filled 2021-05-03: qty 5

## 2021-05-03 MED ORDER — DEXAMETHASONE SODIUM PHOSPHATE 100 MG/10ML IJ SOLN
10.0000 mg | Freq: Once | INTRAMUSCULAR | Status: AC
  Administered 2021-05-03: 10 mg via INTRAVENOUS
  Filled 2021-05-03: qty 1

## 2021-05-03 MED ORDER — DEXTROSE 5 % IV SOLN
Freq: Once | INTRAVENOUS | Status: AC
  Filled 2021-05-03: qty 250

## 2021-05-03 MED ORDER — ALTEPLASE 2 MG IJ SOLR
2.0000 mg | Freq: Once | INTRAMUSCULAR | Status: AC | PRN
  Administered 2021-05-03: 2 mg
  Filled 2021-05-03: qty 2

## 2021-05-03 MED ORDER — SODIUM CHLORIDE 0.9 % IV SOLN
2400.0000 mg/m2 | INTRAVENOUS | Status: DC
  Administered 2021-05-03: 5600 mg via INTRAVENOUS
  Filled 2021-05-03: qty 112

## 2021-05-03 MED ORDER — OXALIPLATIN CHEMO INJECTION 100 MG/20ML
85.0000 mg/m2 | Freq: Once | INTRAVENOUS | Status: AC
  Administered 2021-05-03: 200 mg via INTRAVENOUS
  Filled 2021-05-03: qty 40

## 2021-05-03 MED ORDER — FLUOROURACIL CHEMO INJECTION 2.5 GM/50ML
400.0000 mg/m2 | Freq: Once | INTRAVENOUS | Status: AC
  Administered 2021-05-03: 950 mg via INTRAVENOUS
  Filled 2021-05-03: qty 19

## 2021-05-03 MED ORDER — LEUCOVORIN CALCIUM INJECTION 350 MG
400.0000 mg/m2 | Freq: Once | INTRAVENOUS | Status: AC
  Administered 2021-05-03: 932 mg via INTRAVENOUS
  Filled 2021-05-03: qty 46.6

## 2021-05-03 NOTE — Patient Instructions (Signed)
Eric Lewis   Discharge Instructions: Thank you for choosing [facility] to provide your oncology and hematology care.   If you have a lab appointment with the [facility], please go directly to the [facility] and check in at the registration area.   Wear comfortable clothing and clothing appropriate for easy access to any Portacath or PICC line.   We strive to give you quality time with your provider. You may need to reschedule your appointment if you arrive late (15 or more minutes).  Arriving late affects you and other patients whose appointments are after yours.  Also, if you miss three or more appointments without notifying the office, you may be dismissed from the clinic at the provider's discretion.      For prescription refill requests, have your pharmacy contact our office and allow 72 hours for refills to be completed.    Today you received the following chemotherapy and/or immunotherapy agents Oxaliplatin (ELOXATIN), Leucovorin & Flourouracil (ADRUCIL).      To help prevent nausea and vomiting after your treatment, we encourage you to take your nausea medication as directed.  BELOW ARE SYMPTOMS THAT SHOULD BE REPORTED IMMEDIATELY: *FEVER GREATER THAN 100.4 F (38 C) OR HIGHER *CHILLS OR SWEATING *NAUSEA AND VOMITING THAT IS NOT CONTROLLED WITH YOUR NAUSEA MEDICATION *UNUSUAL SHORTNESS OF BREATH *UNUSUAL BRUISING OR BLEEDING *URINARY PROBLEMS (pain or burning when urinating, or frequent urination) *BOWEL PROBLEMS (unusual diarrhea, constipation, pain near the anus) TENDERNESS IN MOUTH AND THROAT WITH OR WITHOUT PRESENCE OF ULCERS (sore throat, sores in mouth, or a toothache) UNUSUAL RASH, SWELLING OR PAIN  UNUSUAL VAGINAL DISCHARGE OR ITCHING   Items with * indicate a potential emergency and should be followed up as soon as possible or go to the Emergency Department if any problems should occur.  Please show the CHEMOTHERAPY ALERT  CARD or IMMUNOTHERAPY ALERT CARD at check-in to the Emergency Department and triage nurse.  Should you have questions after your visit or need to cancel or reschedule your appointment, please contact [facility]  Dept: [phone]  and follow the prompts.  Office hours are 8:00 a.m. to 4:30 p.m. Monday - Friday. Please note that voicemails left after 4:00 p.m. may not be returned until the following business day.  We are closed weekends and major holidays. You have access to a nurse at all times for urgent questions. Please call the main number to the clinic Dept: [phone] and follow the prompts.   For any non-urgent questions, you may also contact your provider using MyChart. We now offer e-Visits for anyone 50 and older to request care online for non-urgent symptoms. For details visit [email or web address].   Also download the MyChart app! Go to the app store, search "MyChart", open the app, select [facility], and log in with your MyChart username and password.  Due to Covid, a mask is required upon entering the hospital/clinic. If you do not have a mask, one will be given to you upon arrival. For doctor visits, patients may have 1 support person aged 68 or older with them. For treatment visits, patients cannot have anyone with them due to current Covid guidelines and our immunocompromised population.   Oxaliplatin Injection What is this medication? OXALIPLATIN (ox AL i PLA tin) is a chemotherapy drug. It targets fast dividing cells, like cancer cells, and causes these cells to die. This medicine is usedto treat cancers of the colon and rectum, and many  other cancers. This medicine may be used for other purposes; ask your health care provider orpharmacist if you have questions. COMMON BRAND NAME(S): Eloxatin What should I tell my care team before I take this medication? They need to know if you have any of these conditions: heart disease history of irregular  heartbeat liver disease low blood counts, like white cells, platelets, or red blood cells lung or breathing disease, like asthma take medicines that treat or prevent blood clots tingling of the fingers or toes, or other nerve disorder an unusual or allergic reaction to oxaliplatin, other chemotherapy, other medicines, foods, dyes, or preservatives pregnant or trying to get pregnant breast-feeding How should I use this medication? This drug is given as an infusion into a vein. It is administered in a hospitalor clinic by a specially trained health care professional. Talk to your pediatrician regarding the use of this medicine in children.Special care may be needed. Overdosage: If you think you have taken too much of this medicine contact apoison control center or emergency room at once. NOTE: This medicine is only for you. Do not share this medicine with others. What if I miss a dose? It is important not to miss a dose. Call your doctor or health careprofessional if you are unable to keep an appointment. What may interact with this medication? Do not take this medicine with any of the following medications: cisapride dronedarone pimozide thioridazine This medicine may also interact with the following medications: aspirin and aspirin-like medicines certain medicines that treat or prevent blood clots like warfarin, apixaban, dabigatran, and rivaroxaban cisplatin cyclosporine diuretics medicines for infection like acyclovir, adefovir, amphotericin B, bacitracin, cidofovir, foscarnet, ganciclovir, gentamicin, pentamidine, vancomycin NSAIDs, medicines for pain and inflammation, like ibuprofen or naproxen other medicines that prolong the QT interval (an abnormal heart rhythm) pamidronate zoledronic acid This list may not describe all possible interactions. Give your health care provider a list of all the medicines, herbs, non-prescription drugs, or dietary supplements you use. Also tell  them if you smoke, drink alcohol, or use illegaldrugs. Some items may interact with your medicine. What should I watch for while using this medication? Your condition will be monitored carefully while you are receiving thismedicine. You may need blood work done while you are taking this medicine. This medicine may make you feel generally unwell. This is not uncommon as chemotherapy can affect healthy cells as well as cancer cells. Report any side effects. Continue your course of treatment even though you feel ill unless yourhealthcare professional tells you to stop. This medicine can make you more sensitive to cold. Do not drink cold drinks or use ice. Cover exposed skin before coming in contact with cold temperatures or cold objects. When out in cold weather wear warm clothing and cover your mouth and nose to warm the air that goes into your lungs. Tell your doctor if you getsensitive to the cold. Do not become pregnant while taking this medicine or for 9 months after stopping it. Women should inform their health care professional if they wish to become pregnant or think they might be pregnant. Men should not father a child while taking this medicine and for 6 months after stopping it. There is potential for serious side effects to an unborn child. Talk to your health careprofessional for more information. Do not breast-feed a child while taking this medicine or for 3 months afterstopping it. This medicine has caused ovarian failure in some women. This medicine may make it more difficult to get pregnant.  Talk to your health care professional if [place] concerned about your fertility. This medicine has caused decreased sperm counts in some men. This may make it more difficult to father a child. Talk to your health care professional if [place] concerned about your fertility. This medicine may increase your risk of getting an infection. Call your health care professional for advice if you get a fever, chills,  or sore throat, or other symptoms of a cold or flu. Do not treat yourself. Try to avoid beingaround people who are sick. Avoid taking medicines that contain aspirin, acetaminophen, ibuprofen, naproxen, or ketoprofen unless instructed by your health care professional.These medicines may hide a fever. Be careful brushing or flossing your teeth or using a toothpick because you may get an infection or bleed more easily. If you have any dental work done, Primary school teacher you are receiving this medicine. What side effects may I notice from receiving this medication? Side effects that you should report to your doctor or health care professionalas soon as possible: allergic reactions like skin rash, itching or hives, swelling of the face, lips, or tongue breathing problems cough low blood counts - this medicine may decrease the number of white blood cells, red blood cells, and platelets. You may be at increased risk for infections and bleeding nausea, vomiting pain, redness, or irritation at site where injected pain, tingling, numbness in the hands or feet signs and symptoms of bleeding such as bloody or black, tarry stools; red or dark brown urine; spitting up blood or brown material that looks like coffee grounds; red spots on the skin; unusual bruising or bleeding from the eyes, gums, or nose signs and symptoms of a dangerous change in heartbeat or heart rhythm like chest pain; dizziness; fast, irregular heartbeat; palpitations; feeling faint or lightheaded; falls signs and symptoms of infection like fever; chills; cough; sore throat; pain or trouble passing urine signs and symptoms of liver injury like dark yellow or brown urine; general ill feeling or flu-like symptoms; light-colored stools; loss of appetite; nausea; right upper belly pain; unusually weak or tired; yellowing of the eyes or skin signs and symptoms of low red blood cells or anemia such as unusually weak or tired; feeling faint or  lightheaded; falls signs and symptoms of muscle injury like dark urine; trouble passing urine or change in the amount of urine; unusually weak or tired; muscle pain; back pain Side effects that usually do not require medical attention (report to yourdoctor or health care professional if they continue or are bothersome): changes in taste diarrhea gas hair loss loss of appetite mouth sores This list may not describe all possible side effects. Call your doctor for medical advice about side effects. You may report side effects to FDA at1-800-FDA-1088. Where should I keep my medication? This drug is given in a hospital or clinic and will not be stored at home. NOTE: This sheet is a summary. It may not cover all possible information. If you have questions about this medicine, talk to your doctor, pharmacist, orhealth care provider.  2022 Elsevier/Gold Standard (2019-03-12 12:20:35)  Leucovorin injection What is this medication? LEUCOVORIN (loo koe VOR in) is used to prevent or treat the harmful effects of some medicines. This medicine is used to treat anemia caused by a low amount of folic acid in the body. It is also used with 5-fluorouracil (5-FU) to treatcolon cancer. This medicine may be used for other purposes; ask your health care provider orpharmacist if you have questions. What should I  tell my care team before I take this medication? They need to know if you have any of these conditions: anemia from low levels of vitamin B-12 in the blood an unusual or allergic reaction to leucovorin, folic acid, other medicines, foods, dyes, or preservatives pregnant or trying to get pregnant breast-feeding How should I use this medication? This medicine is for injection into a muscle or into a vein. It is given by ahealth care professional in a hospital or clinic setting. Talk to your pediatrician regarding the use of this medicine in children.Special care may be needed. Overdosage: If you think you  have taken too much of this medicine contact apoison control center or emergency room at once. NOTE: This medicine is only for you. Do not share this medicine with others. What if I miss a dose? This does not apply. What may interact with this medication? capecitabine fluorouracil phenobarbital phenytoin primidone trimethoprim-sulfamethoxazole This list may not describe all possible interactions. Give your health care provider a list of all the medicines, herbs, non-prescription drugs, or dietary supplements you use. Also tell them if you smoke, drink alcohol, or use illegaldrugs. Some items may interact with your medicine. What should I watch for while using this medication? Your condition will be monitored carefully while you are receiving thismedicine. This medicine may increase the side effects of 5-fluorouracil, 5-FU. Tell your doctor or health care professional if you have diarrhea or mouth sores that donot get better or that get worse. What side effects may I notice from receiving this medication? Side effects that you should report to your doctor or health care professionalas soon as possible: allergic reactions like skin rash, itching or hives, swelling of the face, lips, or tongue breathing problems fever, infection mouth sores unusual bleeding or bruising unusually weak or tired Side effects that usually do not require medical attention (report to yourdoctor or health care professional if they continue or are bothersome): constipation or diarrhea loss of appetite nausea, vomiting This list may not describe all possible side effects. Call your doctor for medical advice about side effects. You may report side effects to FDA at1-800-FDA-1088. Where should I keep my medication? This drug is given in a hospital or clinic and will not be stored at home. NOTE: This sheet is a summary. It may not cover all possible information. If you have questions about this medicine, talk to your  doctor, pharmacist, orhealth care provider.  2022 Elsevier/Gold Standard (2008-04-28 16:50:29)  Fluorouracil, 5-FU injection What is this medication? FLUOROURACIL, 5-FU (flure oh YOOR a sil) is a chemotherapy drug. It slows the growth of cancer cells. This medicine is used to treat many types of cancer like breast cancer, colon or rectal cancer, pancreatic cancer, and stomachcancer. This medicine may be used for other purposes; ask your health care provider orpharmacist if you have questions. COMMON BRAND NAME(S): Adrucil What should I tell my care team before I take this medication? They need to know if you have any of these conditions: blood disorders dihydropyrimidine dehydrogenase (DPD) deficiency infection (especially a virus infection such as chickenpox, cold sores, or herpes) kidney disease liver disease malnourished, poor nutrition recent or ongoing radiation therapy an unusual or allergic reaction to fluorouracil, other chemotherapy, other medicines, foods, dyes, or preservatives pregnant or trying to get pregnant breast-feeding How should I use this medication? This drug is given as an infusion or injection into a vein. It is administeredin a hospital or clinic by a specially trained health care professional. Talk to  your pediatrician regarding the use of this medicine in children.Special care may be needed. Overdosage: If you think you have taken too much of this medicine contact apoison control center or emergency room at once. NOTE: This medicine is only for you. Do not share this medicine with others. What if I miss a dose? It is important not to miss your dose. Call your doctor or health careprofessional if you are unable to keep an appointment. What may interact with this medication? Do not take this medicine with any of the following medications: live virus vaccines This medicine may also interact with the following medications: medicines that treat or prevent blood  clots like warfarin, enoxaparin, and dalteparin This list may not describe all possible interactions. Give your health care provider a list of all the medicines, herbs, non-prescription drugs, or dietary supplements you use. Also tell them if you smoke, drink alcohol, or use illegaldrugs. Some items may interact with your medicine. What should I watch for while using this medication? Visit your doctor for checks on your progress. This drug may make you feel generally unwell. This is not uncommon, as chemotherapy can affect healthy cells as well as cancer cells. Report any side effects. Continue your course oftreatment even though you feel ill unless your doctor tells you to stop. In some cases, you may be given additional medicines to help with side effects.Follow all directions for their use. Call your doctor or health care professional for advice if you get a fever, chills or sore throat, or other symptoms of a cold or flu. Do not treat yourself. This drug decreases your body's ability to fight infections. Try toavoid being around people who are sick. This medicine may increase your risk to bruise or bleed. Call your doctor orhealth care professional if you notice any unusual bleeding. Be careful brushing and flossing your teeth or using a toothpick because you may get an infection or bleed more easily. If you have any dental work done,tell your dentist you are receiving this medicine. Avoid taking products that contain aspirin, acetaminophen, ibuprofen, naproxen, or ketoprofen unless instructed by your doctor. These medicines may hide afever. Do not become pregnant while taking this medicine. Women should inform their doctor if they wish to become pregnant or think they might be pregnant. There is a potential for serious side effects to an unborn child. Talk to your health care professional or pharmacist for more information. Do not breast-feed aninfant while taking this medicine. Men should inform  their doctor if they wish to father a child. This medicinemay lower sperm counts. Do not treat diarrhea with over the counter products. Contact your doctor ifyou have diarrhea that lasts more than 2 days or if it is severe and watery. This medicine can make you more sensitive to the sun. Keep out of the sun. If you cannot avoid being in the sun, wear protective clothing and use sunscreen.Do not use sun lamps or tanning beds/booths. What side effects may I notice from receiving this medication? Side effects that you should report to your doctor or health care professionalas soon as possible: allergic reactions like skin rash, itching or hives, swelling of the face, lips, or tongue low blood counts - this medicine may decrease the number of white blood cells, red blood cells and platelets. You may be at increased risk for infections and bleeding. signs of infection - fever or chills, cough, sore throat, pain or difficulty passing urine signs of decreased platelets or bleeding - bruising, pinpoint  red spots on the skin, black, tarry stools, blood in the urine signs of decreased red blood cells - unusually weak or tired, fainting spells, lightheadedness breathing problems changes in vision chest pain mouth sores nausea and vomiting pain, swelling, redness at site where injected pain, tingling, numbness in the hands or feet redness, swelling, or sores on hands or feet stomach pain unusual bleeding Side effects that usually do not require medical attention (report to yourdoctor or health care professional if they continue or are bothersome): changes in finger or toe nails diarrhea dry or itchy skin hair loss headache loss of appetite sensitivity of eyes to the light stomach upset unusually teary eyes This list may not describe all possible side effects. Call your doctor for medical advice about side effects. You may report side effects to FDA at1-800-FDA-1088. Where should I keep my  medication? This drug is given in a hospital or clinic and will not be stored at home. NOTE: This sheet is a summary. It may not cover all possible information. If you have questions about this medicine, talk to your doctor, pharmacist, orhealth care provider.  2022 Elsevier/Gold Standard (2019-09-23 15:00:03)

## 2021-05-03 NOTE — Progress Notes (Signed)
[place]   HEMATOLOGY AND ONCOLOGY PROGRESS NOTE  Diagnosis: Rectal cancer  INTERVAL HISTORY:  Eric Lewis presents to the clinic for cycle 4 of neoadjuvant FOLFOX.  It is unaccompanied for this visit.  He continues to do well without any significant limitations.  Patient reports his energy and appetite are stable.  He experiences intermittent episodes of fatigue but continues to complete all his ADLs on his own.  Patient denies any nausea, vomiting or abdominal pain.  He has loose stools secondary to his diabetes medications that has been present prior to initiation of chemotherapy.  He denies any hematochezia or melena.  Patient experienced cold sensitivity in the hands, feet and throat for approximately a week.  He denies any residual neuropathy.  He denies any interference with grip or balance.  Patient reports his average blood glucose levels have increased slightly to around 120 since starting chemotherapy.  Patient denies any fevers, chills, night sweats, shortness of breath, chest pain or cough.  He has no other complaints.   REVIEW OF SYSTEMS: Review of Systems  Constitutional:  Negative for chills, fever and weight loss.  Respiratory:  Negative for cough, sputum production and shortness of breath.   Cardiovascular:  Negative for chest pain, palpitations and leg swelling.  Gastrointestinal:  Negative for abdominal pain, blood in stool, constipation, diarrhea, melena, nausea and vomiting.  Skin:  Negative for rash.  Neurological:  Negative for dizziness and headaches.  Endo/Heme/Allergies:  Does not bruise/bleed easily.   Past Medical History:  Diagnosis Date   COVID jan 30 th 2022   fever x 4 days all sx resolved   DM type 2 (diabetes mellitus, type 2) ([place])    Family history of breast cancer    Family history of prostate cancer    History of hypertension    no meds x 1 year due to weight loss as of 03-16-2021   Hypercholesteremia    Obesity    Psoriatic  arthritis ([place])    Rectal cancer ([place])    Umbilical hernia    Past Surgical History:  Procedure Laterality Date   CHONDROPLASTY Right 09/28/2014   Procedure: CHONDROPLASTY, PATELLA/FEMORAL;  Surgeon: Alta Corning, MD;  Location: [facility];  Service: Orthopedics;  Laterality: Right;   colonscopy  03-17-2021   [facility]   KNEE ARTHROSCOPY WITH MEDIAL MENISECTOMY Right 09/28/2014   Procedure: KNEE ARTHROSCOPY WITH MEDIAL MENISECTOMY;  Surgeon: Alta Corning, MD;  Location: [facility];  Service: Orthopedics;  Laterality: Right;   PORTACATH PLACEMENT Right 03/18/2021   Procedure: PLACEMENT PORT-A-CATH;  Surgeon: Ileana Roup, MD;  Location: [facility];  Service: General;  Laterality: Right;   SYNOVECTOMY Right 09/28/2014   Procedure: SYNOVECTOMY, THREE COMPARTMENT;  Surgeon: Alta Corning, MD;  Location: [place];  Service: Orthopedics;  Laterality: Right;   Social History   Socioeconomic History   Marital status: Single    Spouse name: Not on file   Number of children: Not on file   Years of education: Not on file   Highest education level: Not on file  Occupational History   Occupation: Web site Agricultural consultant    Comment: Works from home  Tobacco Use   Smoking status: Never   Smokeless tobacco: Never  Vaping Use   Vaping Use: Never used  Substance and Sexual Activity   Alcohol use: No    Alcohol/week: 0.0 standard drinks   Drug use: No   Sexual activity:  Not on file  Other Topics Concern   Not on file  Social History Narrative   Reports he is the middle child of two other siblings. 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   Social Determinants of Health   Financial Resource Strain: Not on file  Food Insecurity: Not on file  Transportation Needs: Not on file  Physical Activity: Not on file  Stress: Not on file  Social Connections: Not on file    Family History  Problem Relation Age of Onset   Emphysema  Maternal Grandmother    Breast cancer Paternal Grandmother        dx 5s   Prostate cancer Paternal Uncle 64    Current Outpatient Medications:    FARXIGA 5 MG TABS tablet, Take 5 mg by mouth at bedtime., Disp: , Rfl:    loratadine (CLARITIN) 10 MG tablet, Take 10 mg by mouth daily., Disp: , Rfl:    metFORMIN (GLUCOPHAGE) 500 MG tablet, Take 2 tablets by mouth 2 (two) times daily., Disp: , Rfl:    OTEZLA 30 MG TABS, Take 1 tablet by mouth 2 (two) times daily., Disp: , Rfl:    simvastatin (ZOCOR) 40 MG tablet, Take 40 mg by mouth at bedtime., Disp: , Rfl:    Continuous Blood Gluc Receiver (FREESTYLE LIBRE 37 DAY READER) DEVI, See admin instructions. (Patient not taking: No sig reported), Disp: , Rfl:    Continuous Blood Gluc Sensor (FREESTYLE LIBRE 14 DAY SENSOR) MISC, USE AS DIRECTED EVERY 14 DAYS (Patient not taking: No sig reported), Disp: , Rfl:    ondansetron (ZOFRAN) 8 MG tablet, Take 1 tablet (8 mg total) by mouth every 8 (eight) hours as needed for nausea or vomiting. Begin 72 hours after IV chemo treatment (Patient not taking: No sig reported), Disp: 30 tablet, Rfl: 1   prochlorperazine (COMPAZINE) 10 MG tablet, Take 1 tablet (10 mg total) by mouth every 6 (six) hours as needed. (Patient not taking: No sig reported), Disp: 60 tablet, Rfl: 1   OBJECTIVE Performance Status: ECOG 1 VITALS: Blood pressure (!) 142/87, pulse 100, temperature 97.8 F (36.6 C), temperature source Oral, resp. rate 18, height 5\' 7"  (1.702 m), weight 246 lb 6.4 oz (111.8 kg), SpO2 100 %. HEENT: No thrush or ulcers Resp: Lungs clear bilaterally Cardio: Regular rate and rhythm GI: No hepatosplenomegaly, nontender Vascular: No leg edema Skin: Palms without erythema, no rash.    LABS:  Lab Results  Component Value Date   WBC 6.9 05/03/2021   HGB 14.7 05/03/2021   HCT 45.4 05/03/2021   MCV 87.5 05/03/2021   PLT 154 05/03/2021   NEUTROABS 3.8 05/03/2021    Assessment/Plan: Rectal cancer Colonoscopy  02/15/2021- nonobstructing mass at 2-3 cm in the anal verge, removed in a piecemeal fashion, pathology confirmed moderately differentiated adenocarcinoma CTs chest, abdomen, and pelvis on 02/17/2021- no evidence of metastatic disease, no clear mass identified, areas of mild rectal wall thickening MRI 03/02/2021- rectal tumor not identified, no extension beyond the muscularis identified.  N1 disease with multiple left mesorectal nodes, and a 7 mm node beyond the mesorectum at the hypogastric region Cycle 1 FOLFOX 03/22/2021 Cycle 2 FOLFOX 04/05/2021 Cycle 3 FOLFOX 04/19/2021 Cycle 4 FOLFOX 05/03/2021  History of dysphagia-status post a barium swallow 10/22/2020- mild smooth stricture at the GE junction Psoriatic arthritis Diabetes COVID-19 January 22  Disposition: [name] returns today for cycle 4 of FOLFOX.  He continues to do well without any significant limitations. Labs from today  were reviewed without any intervention needed. Patient will proceed with treatment today as planned. He will return in 2 weeks for an office visit and cycle 5 of FOLFOX.   Patient expressed understanding and satisfaction with the plan provided.   I have spent a total of 30 minutes minutes of face-to-face and non-face-to-face time, preparing to see the patient, obtaining and/or reviewing separately obtained history, performing a medically appropriate examination, counseling and educating the patient,  documenting clinical information in the electronic health record, and care coordination.    Lincoln Brigham PA-C Hematology and [place]  05/03/2021 10:28 AM

## 2021-05-05 ENCOUNTER — Other Ambulatory Visit: Payer: Self-pay

## 2021-05-05 ENCOUNTER — Inpatient Hospital Stay: Payer: BC Managed Care – PPO

## 2021-05-05 VITALS — BP 127/89 | HR 113 | Temp 98.5°F | Resp 18

## 2021-05-05 DIAGNOSIS — Z79899 Other long term (current) drug therapy: Secondary | ICD-10-CM | POA: Diagnosis not present

## 2021-05-05 DIAGNOSIS — Z5111 Encounter for antineoplastic chemotherapy: Secondary | ICD-10-CM | POA: Diagnosis not present

## 2021-05-05 DIAGNOSIS — Z803 Family history of malignant neoplasm of breast: Secondary | ICD-10-CM | POA: Diagnosis not present

## 2021-05-05 DIAGNOSIS — C20 Malignant neoplasm of rectum: Secondary | ICD-10-CM | POA: Diagnosis not present

## 2021-05-05 DIAGNOSIS — Z8616 Personal history of COVID-19: Secondary | ICD-10-CM | POA: Diagnosis not present

## 2021-05-05 DIAGNOSIS — E119 Type 2 diabetes mellitus without complications: Secondary | ICD-10-CM | POA: Diagnosis not present

## 2021-05-05 DIAGNOSIS — Z8042 Family history of malignant neoplasm of prostate: Secondary | ICD-10-CM | POA: Diagnosis not present

## 2021-05-05 DIAGNOSIS — L4050 Arthropathic psoriasis, unspecified: Secondary | ICD-10-CM | POA: Diagnosis not present

## 2021-05-05 DIAGNOSIS — Z836 Family history of other diseases of the respiratory system: Secondary | ICD-10-CM | POA: Diagnosis not present

## 2021-05-05 MED ORDER — HEPARIN SOD (PORK) LOCK FLUSH 100 UNIT/ML IV SOLN
500.0000 [IU] | Freq: Once | INTRAVENOUS | Status: AC | PRN
  Administered 2021-05-05: 500 [IU]
  Filled 2021-05-05: qty 5

## 2021-05-05 MED ORDER — SODIUM CHLORIDE 0.9% FLUSH
10.0000 mL | INTRAVENOUS | Status: DC | PRN
  Administered 2021-05-05: 10 mL
  Filled 2021-05-05: qty 10

## 2021-05-15 ENCOUNTER — Other Ambulatory Visit: Payer: Self-pay | Admitting: Oncology

## 2021-05-17 ENCOUNTER — Inpatient Hospital Stay: Payer: BC Managed Care – PPO | Attending: Oncology

## 2021-05-17 ENCOUNTER — Inpatient Hospital Stay: Payer: BC Managed Care – PPO

## 2021-05-17 ENCOUNTER — Encounter: Payer: Self-pay | Admitting: Nurse Practitioner

## 2021-05-17 ENCOUNTER — Inpatient Hospital Stay (HOSPITAL_BASED_OUTPATIENT_CLINIC_OR_DEPARTMENT_OTHER): Payer: BC Managed Care – PPO | Admitting: Nurse Practitioner

## 2021-05-17 ENCOUNTER — Other Ambulatory Visit: Payer: Self-pay

## 2021-05-17 VITALS — HR 93

## 2021-05-17 VITALS — BP 147/99 | HR 102 | Temp 98.2°F | Resp 19 | Ht 67.0 in | Wt 246.0 lb

## 2021-05-17 DIAGNOSIS — C20 Malignant neoplasm of rectum: Secondary | ICD-10-CM

## 2021-05-17 DIAGNOSIS — Z5111 Encounter for antineoplastic chemotherapy: Secondary | ICD-10-CM | POA: Diagnosis not present

## 2021-05-17 DIAGNOSIS — Z95828 Presence of other vascular implants and grafts: Secondary | ICD-10-CM

## 2021-05-17 DIAGNOSIS — Z452 Encounter for adjustment and management of vascular access device: Secondary | ICD-10-CM | POA: Insufficient documentation

## 2021-05-17 LAB — CMP (CANCER CENTER ONLY)
ALT: 30 U/L (ref 0–44)
AST: 20 U/L (ref 15–41)
Albumin: 4.1 g/dL (ref 3.5–5.0)
Alkaline Phosphatase: 81 U/L (ref 38–126)
Anion gap: 9 (ref 5–15)
BUN: 15 mg/dL (ref 6–20)
CO2: 25 mmol/L (ref 22–32)
Calcium: 9 mg/dL (ref 8.9–10.3)
Chloride: 102 mmol/L (ref 98–111)
Creatinine: 0.84 mg/dL (ref 0.61–1.24)
GFR, Estimated: >60 mL/min (ref >60)
Glucose, Bld: 141 mg/dL — ABNORMAL HIGH (ref 70–99)
Potassium: 4.1 mmol/L (ref 3.5–5.1)
Sodium: 136 mmol/L (ref 135–145)
Total Bilirubin: 0.7 mg/dL (ref 0.3–1.2)
Total Protein: 6.4 g/dL — ABNORMAL LOW (ref 6.5–8.1)

## 2021-05-17 LAB — CBC WITH DIFFERENTIAL (CANCER CENTER ONLY)
Abs Immature Granulocytes: 0.02 10*3/uL (ref 0.00–0.07)
Basophils Absolute: 0.1 10*3/uL (ref 0.0–0.1)
Basophils Relative: 1 %
Eosinophils Absolute: 0.1 10*3/uL (ref 0.0–0.5)
Eosinophils Relative: 1 %
HCT: 45 % (ref 39.0–52.0)
Hemoglobin: 14.5 g/dL (ref 13.0–17.0)
Immature Granulocytes: 0 %
Lymphocytes Relative: 27 %
Lymphs Abs: 2 10*3/uL (ref 0.7–4.0)
MCH: 28.4 pg (ref 26.0–34.0)
MCHC: 32.2 g/dL (ref 30.0–36.0)
MCV: 88.1 fL (ref 80.0–100.0)
Monocytes Absolute: 0.9 10*3/uL (ref 0.1–1.0)
Monocytes Relative: 12 %
Neutro Abs: 4.3 10*3/uL (ref 1.7–7.7)
Neutrophils Relative %: 59 %
Platelet Count: 143 10*3/uL — ABNORMAL LOW (ref 150–400)
RBC: 5.11 MIL/uL (ref 4.22–5.81)
RDW: 15.4 % (ref 11.5–15.5)
WBC Count: 7.3 10*3/uL (ref 4.0–10.5)
nRBC: 0 % (ref 0.0–0.2)

## 2021-05-17 MED ORDER — PALONOSETRON HCL INJECTION 0.25 MG/5ML
0.2500 mg | Freq: Once | INTRAVENOUS | Status: AC
  Administered 2021-05-17: 0.25 mg via INTRAVENOUS
  Filled 2021-05-17: qty 5

## 2021-05-17 MED ORDER — DEXTROSE 5 % IV SOLN
Freq: Once | INTRAVENOUS | Status: AC
Start: 2021-05-17 — End: 2021-05-17
  Filled 2021-05-17: qty 250

## 2021-05-17 MED ORDER — OXALIPLATIN CHEMO INJECTION 100 MG/20ML
85.0000 mg/m2 | Freq: Once | INTRAVENOUS | Status: AC
  Administered 2021-05-17: 200 mg via INTRAVENOUS
  Filled 2021-05-17: qty 40

## 2021-05-17 MED ORDER — ALTEPLASE 2 MG IJ SOLR
2.0000 mg | Freq: Once | INTRAMUSCULAR | Status: DC
  Filled 2021-05-17: qty 2

## 2021-05-17 MED ORDER — SODIUM CHLORIDE 0.9% FLUSH
10.0000 mL | INTRAVENOUS | Status: DC | PRN
  Filled 2021-05-17: qty 10

## 2021-05-17 MED ORDER — DEXTROSE 5 % IV SOLN
Freq: Once | INTRAVENOUS | Status: DC
  Filled 2021-05-17: qty 250

## 2021-05-17 MED ORDER — FLUOROURACIL CHEMO INJECTION 2.5 GM/50ML
400.0000 mg/m2 | Freq: Once | INTRAVENOUS | Status: AC
  Administered 2021-05-17: 950 mg via INTRAVENOUS
  Filled 2021-05-17: qty 19

## 2021-05-17 MED ORDER — SODIUM CHLORIDE 0.9 % IV SOLN
10.0000 mg | Freq: Once | INTRAVENOUS | Status: AC
  Administered 2021-05-17: 10 mg via INTRAVENOUS
  Filled 2021-05-17: qty 1

## 2021-05-17 MED ORDER — SODIUM CHLORIDE 0.9 % IV SOLN
2400.0000 mg/m2 | INTRAVENOUS | Status: DC
  Administered 2021-05-17: 5600 mg via INTRAVENOUS
  Filled 2021-05-17: qty 112

## 2021-05-17 MED ORDER — SODIUM CHLORIDE 0.9% FLUSH
10.0000 mL | Freq: Once | INTRAVENOUS | Status: AC
  Administered 2021-05-17: 10 mL via INTRAVENOUS
  Filled 2021-05-17: qty 10

## 2021-05-17 MED ORDER — LEUCOVORIN CALCIUM INJECTION 350 MG
400.0000 mg/m2 | Freq: Once | INTRAVENOUS | Status: AC
  Administered 2021-05-17: 932 mg via INTRAVENOUS
  Filled 2021-05-17: qty 46.6

## 2021-05-17 NOTE — Progress Notes (Signed)
  [facility] OFFICE PROGRESS NOTE   Diagnosis: Rectal cancer  INTERVAL HISTORY:   Eric Lewis returns as scheduled.  He completed cycle 4 FOLFOX 05/03/2021.  He denies nausea/vomiting.  No mouth sores.  No significant diarrhea.  Cold sensitivity lasted about 1 week.  He noted fatigue and a poor appetite for several days following the most recent cycle of chemotherapy.  Blood sugar increased to the 260 range following chemotherapy.  Objective:  Vital signs in last 24 hours:  Blood pressure (!) 147/99, pulse (!) 102, temperature 98.2 F (36.8 C), temperature source Oral, resp. rate 19, height 5\' 7"  (1.702 m), weight 246 lb (111.6 kg), SpO2 98 %.    HEENT: No thrush or ulcers. Resp: Lungs clear bilaterally. Cardio: Regular rate and rhythm. GI: Abdomen soft and nontender.  No hepatomegaly. Vascular: No leg edema. Neuro: Vibratory sense intact over the fingertips per tuning fork exam. Skin: Palms without erythema. Port-A-Cath without erythema.   Lab Results:  Lab Results  Component Value Date   WBC 7.3 05/17/2021   HGB 14.5 05/17/2021   HCT 45.0 05/17/2021   MCV 88.1 05/17/2021   PLT 143 (L) 05/17/2021   NEUTROABS 4.3 05/17/2021    Imaging:  No results found.  Medications: I have reviewed the patient's current medications.  Assessment/Plan: Rectal cancer Colonoscopy 02/15/2021- nonobstructing mass at 2-3 cm in the anal verge, removed in a piecemeal fashion, pathology confirmed moderately differentiated adenocarcinoma CTs chest, abdomen, and pelvis on 02/17/2021- no evidence of metastatic disease, no clear mass identified, areas of mild rectal wall thickening MRI 03/02/2021- rectal tumor not identified, no extension beyond the muscularis identified.  N1 disease with multiple left mesorectal nodes, and a 7 mm node beyond the mesorectum at the hypogastric region Cycle 1 FOLFOX 03/22/2021 Cycle 2 FOLFOX 04/05/2021 Cycle 3 FOLFOX 04/19/2021 Cycle 4 FOLFOX  05/03/2021 Cycle 5 FOLFOX 05/17/2021   History of dysphagia-status post a barium swallow 10/22/2020- mild smooth stricture at the GE junction Psoriatic arthritis Diabetes COVID-19 January 22  Disposition: Eric Lewis appears stable.  He has completed 4 cycles of neoadjuvant FOLFOX.  He continues to tolerate chemotherapy well.  Plan to proceed with cycle 5 today as scheduled.  We reviewed the CBC from today.  Counts adequate to proceed as above.  He will monitor his blood sugar closely following chemotherapy, contact PCP with persistent elevated readings.  He will return for lab, follow-up, cycle 6 FOLFOX in 2 weeks.  He will contact the office in the interim with any problems.     Ned Card ANP/GNP-BC   05/17/2021  9:32 AM

## 2021-05-17 NOTE — Patient Instructions (Signed)
Eric Lewis   Discharge Instructions: Thank you for choosing [facility] to provide your oncology and hematology care.   If you have a lab appointment with the [facility], please go directly to the [facility] and check in at the registration area.   Wear comfortable clothing and clothing appropriate for easy access to any Portacath or PICC line.   We strive to give you quality time with your provider. You may need to reschedule your appointment if you arrive late (15 or more minutes).  Arriving late affects you and other patients whose appointments are after yours.  Also, if you miss three or more appointments without notifying the office, you may be dismissed from the clinic at the provider's discretion.      For prescription refill requests, have your pharmacy contact our office and allow 72 hours for refills to be completed.    Today you received the following chemotherapy and/or immunotherapy agents Oxaliplatin (ELOXATIN), Leucovorin & Flourouracil (ADRUCIL).      To help prevent nausea and vomiting after your treatment, we encourage you to take your nausea medication as directed.  BELOW ARE SYMPTOMS THAT SHOULD BE REPORTED IMMEDIATELY: *FEVER GREATER THAN 100.4 F (38 C) OR HIGHER *CHILLS OR SWEATING *NAUSEA AND VOMITING THAT IS NOT CONTROLLED WITH YOUR NAUSEA MEDICATION *UNUSUAL SHORTNESS OF BREATH *UNUSUAL BRUISING OR BLEEDING *URINARY PROBLEMS (pain or burning when urinating, or frequent urination) *BOWEL PROBLEMS (unusual diarrhea, constipation, pain near the anus) TENDERNESS IN MOUTH AND THROAT WITH OR WITHOUT PRESENCE OF ULCERS (sore throat, sores in mouth, or a toothache) UNUSUAL RASH, SWELLING OR PAIN  UNUSUAL VAGINAL DISCHARGE OR ITCHING   Items with * indicate a potential emergency and should be followed up as soon as possible or go to the Emergency Department if any problems should occur.  Please show the CHEMOTHERAPY ALERT  CARD or IMMUNOTHERAPY ALERT CARD at check-in to the Emergency Department and triage nurse.  Should you have questions after your visit or need to cancel or reschedule your appointment, please contact [facility]  Dept: [phone]  and follow the prompts.  Office hours are 8:00 a.m. to 4:30 p.m. Monday - Friday. Please note that voicemails left after 4:00 p.m. may not be returned until the following business day.  We are closed weekends and major holidays. You have access to a nurse at all times for urgent questions. Please call the main number to the clinic Dept: [phone] and follow the prompts.   For any non-urgent questions, you may also contact your provider using MyChart. We now offer e-Visits for anyone 50 and older to request care online for non-urgent symptoms. For details visit [email or web address].   Also download the MyChart app! Go to the app store, search "MyChart", open the app, select [facility], and log in with your MyChart username and password.  Due to Covid, a mask is required upon entering the hospital/clinic. If you do not have a mask, one will be given to you upon arrival. For doctor visits, patients may have 1 support person aged 68 or older with them. For treatment visits, patients cannot have anyone with them due to current Covid guidelines and our immunocompromised population.   Oxaliplatin Injection What is this medication? OXALIPLATIN (ox AL i PLA tin) is a chemotherapy drug. It targets fast dividing cells, like cancer cells, and causes these cells to die. This medicine is usedto treat cancers of the colon and rectum, and many  other cancers. This medicine may be used for other purposes; ask your health care provider orpharmacist if you have questions. COMMON BRAND NAME(S): Eloxatin What should I tell my care team before I take this medication? They need to know if you have any of these conditions: heart disease history of irregular  heartbeat liver disease low blood counts, like white cells, platelets, or red blood cells lung or breathing disease, like asthma take medicines that treat or prevent blood clots tingling of the fingers or toes, or other nerve disorder an unusual or allergic reaction to oxaliplatin, other chemotherapy, other medicines, foods, dyes, or preservatives pregnant or trying to get pregnant breast-feeding How should I use this medication? This drug is given as an infusion into a vein. It is administered in a hospitalor clinic by a specially trained health care professional. Talk to your pediatrician regarding the use of this medicine in children.Special care may be needed. Overdosage: If you think you have taken too much of this medicine contact apoison control center or emergency room at once. NOTE: This medicine is only for you. Do not share this medicine with others. What if I miss a dose? It is important not to miss a dose. Call your doctor or health careprofessional if you are unable to keep an appointment. What may interact with this medication? Do not take this medicine with any of the following medications: cisapride dronedarone pimozide thioridazine This medicine may also interact with the following medications: aspirin and aspirin-like medicines certain medicines that treat or prevent blood clots like warfarin, apixaban, dabigatran, and rivaroxaban cisplatin cyclosporine diuretics medicines for infection like acyclovir, adefovir, amphotericin B, bacitracin, cidofovir, foscarnet, ganciclovir, gentamicin, pentamidine, vancomycin NSAIDs, medicines for pain and inflammation, like ibuprofen or naproxen other medicines that prolong the QT interval (an abnormal heart rhythm) pamidronate zoledronic acid This list may not describe all possible interactions. Give your health care provider a list of all the medicines, herbs, non-prescription drugs, or dietary supplements you use. Also tell  them if you smoke, drink alcohol, or use illegaldrugs. Some items may interact with your medicine. What should I watch for while using this medication? Your condition will be monitored carefully while you are receiving thismedicine. You may need blood work done while you are taking this medicine. This medicine may make you feel generally unwell. This is not uncommon as chemotherapy can affect healthy cells as well as cancer cells. Report any side effects. Continue your course of treatment even though you feel ill unless yourhealthcare professional tells you to stop. This medicine can make you more sensitive to cold. Do not drink cold drinks or use ice. Cover exposed skin before coming in contact with cold temperatures or cold objects. When out in cold weather wear warm clothing and cover your mouth and nose to warm the air that goes into your lungs. Tell your doctor if you getsensitive to the cold. Do not become pregnant while taking this medicine or for 9 months after stopping it. Women should inform their health care professional if they wish to become pregnant or think they might be pregnant. Men should not father a child while taking this medicine and for 6 months after stopping it. There is potential for serious side effects to an unborn child. Talk to your health careprofessional for more information. Do not breast-feed a child while taking this medicine or for 3 months afterstopping it. This medicine has caused ovarian failure in some women. This medicine may make it more difficult to get pregnant.  Talk to your health care professional if [place] concerned about your fertility. This medicine has caused decreased sperm counts in some men. This may make it more difficult to father a child. Talk to your health care professional if [place] concerned about your fertility. This medicine may increase your risk of getting an infection. Call your health care professional for advice if you get a fever, chills,  or sore throat, or other symptoms of a cold or flu. Do not treat yourself. Try to avoid beingaround people who are sick. Avoid taking medicines that contain aspirin, acetaminophen, ibuprofen, naproxen, or ketoprofen unless instructed by your health care professional.These medicines may hide a fever. Be careful brushing or flossing your teeth or using a toothpick because you may get an infection or bleed more easily. If you have any dental work done, Primary school teacher you are receiving this medicine. What side effects may I notice from receiving this medication? Side effects that you should report to your doctor or health care professionalas soon as possible: allergic reactions like skin rash, itching or hives, swelling of the face, lips, or tongue breathing problems cough low blood counts - this medicine may decrease the number of white blood cells, red blood cells, and platelets. You may be at increased risk for infections and bleeding nausea, vomiting pain, redness, or irritation at site where injected pain, tingling, numbness in the hands or feet signs and symptoms of bleeding such as bloody or black, tarry stools; red or dark brown urine; spitting up blood or brown material that looks like coffee grounds; red spots on the skin; unusual bruising or bleeding from the eyes, gums, or nose signs and symptoms of a dangerous change in heartbeat or heart rhythm like chest pain; dizziness; fast, irregular heartbeat; palpitations; feeling faint or lightheaded; falls signs and symptoms of infection like fever; chills; cough; sore throat; pain or trouble passing urine signs and symptoms of liver injury like dark yellow or brown urine; general ill feeling or flu-like symptoms; light-colored stools; loss of appetite; nausea; right upper belly pain; unusually weak or tired; yellowing of the eyes or skin signs and symptoms of low red blood cells or anemia such as unusually weak or tired; feeling faint or  lightheaded; falls signs and symptoms of muscle injury like dark urine; trouble passing urine or change in the amount of urine; unusually weak or tired; muscle pain; back pain Side effects that usually do not require medical attention (report to yourdoctor or health care professional if they continue or are bothersome): changes in taste diarrhea gas hair loss loss of appetite mouth sores This list may not describe all possible side effects. Call your doctor for medical advice about side effects. You may report side effects to FDA at1-800-FDA-1088. Where should I keep my medication? This drug is given in a hospital or clinic and will not be stored at home. NOTE: This sheet is a summary. It may not cover all possible information. If you have questions about this medicine, talk to your doctor, pharmacist, orhealth care provider.  2022 Elsevier/Gold Standard (2019-03-12 12:20:35)  Leucovorin injection What is this medication? LEUCOVORIN (loo koe VOR in) is used to prevent or treat the harmful effects of some medicines. This medicine is used to treat anemia caused by a low amount of folic acid in the body. It is also used with 5-fluorouracil (5-FU) to treatcolon cancer. This medicine may be used for other purposes; ask your health care provider orpharmacist if you have questions. What should I  tell my care team before I take this medication? They need to know if you have any of these conditions: anemia from low levels of vitamin B-12 in the blood an unusual or allergic reaction to leucovorin, folic acid, other medicines, foods, dyes, or preservatives pregnant or trying to get pregnant breast-feeding How should I use this medication? This medicine is for injection into a muscle or into a vein. It is given by ahealth care professional in a hospital or clinic setting. Talk to your pediatrician regarding the use of this medicine in children.Special care may be needed. Overdosage: If you think you  have taken too much of this medicine contact apoison control center or emergency room at once. NOTE: This medicine is only for you. Do not share this medicine with others. What if I miss a dose? This does not apply. What may interact with this medication? capecitabine fluorouracil phenobarbital phenytoin primidone trimethoprim-sulfamethoxazole This list may not describe all possible interactions. Give your health care provider a list of all the medicines, herbs, non-prescription drugs, or dietary supplements you use. Also tell them if you smoke, drink alcohol, or use illegaldrugs. Some items may interact with your medicine. What should I watch for while using this medication? Your condition will be monitored carefully while you are receiving thismedicine. This medicine may increase the side effects of 5-fluorouracil, 5-FU. Tell your doctor or health care professional if you have diarrhea or mouth sores that donot get better or that get worse. What side effects may I notice from receiving this medication? Side effects that you should report to your doctor or health care professionalas soon as possible: allergic reactions like skin rash, itching or hives, swelling of the face, lips, or tongue breathing problems fever, infection mouth sores unusual bleeding or bruising unusually weak or tired Side effects that usually do not require medical attention (report to yourdoctor or health care professional if they continue or are bothersome): constipation or diarrhea loss of appetite nausea, vomiting This list may not describe all possible side effects. Call your doctor for medical advice about side effects. You may report side effects to FDA at1-800-FDA-1088. Where should I keep my medication? This drug is given in a hospital or clinic and will not be stored at home. NOTE: This sheet is a summary. It may not cover all possible information. If you have questions about this medicine, talk to your  doctor, pharmacist, orhealth care provider.  2022 Elsevier/Gold Standard (2008-04-28 16:50:29)  Fluorouracil, 5-FU injection What is this medication? FLUOROURACIL, 5-FU (flure oh YOOR a sil) is a chemotherapy drug. It slows the growth of cancer cells. This medicine is used to treat many types of cancer like breast cancer, colon or rectal cancer, pancreatic cancer, and stomachcancer. This medicine may be used for other purposes; ask your health care provider orpharmacist if you have questions. COMMON BRAND NAME(S): Adrucil What should I tell my care team before I take this medication? They need to know if you have any of these conditions: blood disorders dihydropyrimidine dehydrogenase (DPD) deficiency infection (especially a virus infection such as chickenpox, cold sores, or herpes) kidney disease liver disease malnourished, poor nutrition recent or ongoing radiation therapy an unusual or allergic reaction to fluorouracil, other chemotherapy, other medicines, foods, dyes, or preservatives pregnant or trying to get pregnant breast-feeding How should I use this medication? This drug is given as an infusion or injection into a vein. It is administeredin a hospital or clinic by a specially trained health care professional. Talk to  your pediatrician regarding the use of this medicine in children.Special care may be needed. Overdosage: If you think you have taken too much of this medicine contact apoison control center or emergency room at once. NOTE: This medicine is only for you. Do not share this medicine with others. What if I miss a dose? It is important not to miss your dose. Call your doctor or health careprofessional if you are unable to keep an appointment. What may interact with this medication? Do not take this medicine with any of the following medications: live virus vaccines This medicine may also interact with the following medications: medicines that treat or prevent blood  clots like warfarin, enoxaparin, and dalteparin This list may not describe all possible interactions. Give your health care provider a list of all the medicines, herbs, non-prescription drugs, or dietary supplements you use. Also tell them if you smoke, drink alcohol, or use illegaldrugs. Some items may interact with your medicine. What should I watch for while using this medication? Visit your doctor for checks on your progress. This drug may make you feel generally unwell. This is not uncommon, as chemotherapy can affect healthy cells as well as cancer cells. Report any side effects. Continue your course oftreatment even though you feel ill unless your doctor tells you to stop. In some cases, you may be given additional medicines to help with side effects.Follow all directions for their use. Call your doctor or health care professional for advice if you get a fever, chills or sore throat, or other symptoms of a cold or flu. Do not treat yourself. This drug decreases your body's ability to fight infections. Try toavoid being around people who are sick. This medicine may increase your risk to bruise or bleed. Call your doctor orhealth care professional if you notice any unusual bleeding. Be careful brushing and flossing your teeth or using a toothpick because you may get an infection or bleed more easily. If you have any dental work done,tell your dentist you are receiving this medicine. Avoid taking products that contain aspirin, acetaminophen, ibuprofen, naproxen, or ketoprofen unless instructed by your doctor. These medicines may hide afever. Do not become pregnant while taking this medicine. Women should inform their doctor if they wish to become pregnant or think they might be pregnant. There is a potential for serious side effects to an unborn child. Talk to your health care professional or pharmacist for more information. Do not breast-feed aninfant while taking this medicine. Men should inform  their doctor if they wish to father a child. This medicinemay lower sperm counts. Do not treat diarrhea with over the counter products. Contact your doctor ifyou have diarrhea that lasts more than 2 days or if it is severe and watery. This medicine can make you more sensitive to the sun. Keep out of the sun. If you cannot avoid being in the sun, wear protective clothing and use sunscreen.Do not use sun lamps or tanning beds/booths. What side effects may I notice from receiving this medication? Side effects that you should report to your doctor or health care professionalas soon as possible: allergic reactions like skin rash, itching or hives, swelling of the face, lips, or tongue low blood counts - this medicine may decrease the number of white blood cells, red blood cells and platelets. You may be at increased risk for infections and bleeding. signs of infection - fever or chills, cough, sore throat, pain or difficulty passing urine signs of decreased platelets or bleeding - bruising, pinpoint  red spots on the skin, black, tarry stools, blood in the urine signs of decreased red blood cells - unusually weak or tired, fainting spells, lightheadedness breathing problems changes in vision chest pain mouth sores nausea and vomiting pain, swelling, redness at site where injected pain, tingling, numbness in the hands or feet redness, swelling, or sores on hands or feet stomach pain unusual bleeding Side effects that usually do not require medical attention (report to yourdoctor or health care professional if they continue or are bothersome): changes in finger or toe nails diarrhea dry or itchy skin hair loss headache loss of appetite sensitivity of eyes to the light stomach upset unusually teary eyes This list may not describe all possible side effects. Call your doctor for medical advice about side effects. You may report side effects to FDA at1-800-FDA-1088. Where should I keep my  medication? This drug is given in a hospital or clinic and will not be stored at home. NOTE: This sheet is a summary. It may not cover all possible information. If you have questions about this medicine, talk to your doctor, pharmacist, orhealth care provider.  2022 Elsevier/Gold Standard (2019-09-23 15:00:03)

## 2021-05-19 ENCOUNTER — Inpatient Hospital Stay: Payer: BC Managed Care – PPO

## 2021-05-19 ENCOUNTER — Other Ambulatory Visit: Payer: Self-pay

## 2021-05-19 VITALS — BP 134/116 | HR 111 | Temp 98.0°F | Resp 18

## 2021-05-19 DIAGNOSIS — Z452 Encounter for adjustment and management of vascular access device: Secondary | ICD-10-CM | POA: Diagnosis not present

## 2021-05-19 DIAGNOSIS — Z5111 Encounter for antineoplastic chemotherapy: Secondary | ICD-10-CM | POA: Diagnosis not present

## 2021-05-19 DIAGNOSIS — C20 Malignant neoplasm of rectum: Secondary | ICD-10-CM

## 2021-05-19 MED ORDER — HEPARIN SOD (PORK) LOCK FLUSH 100 UNIT/ML IV SOLN
500.0000 [IU] | Freq: Once | INTRAVENOUS | Status: AC | PRN
  Administered 2021-05-19: 500 [IU]
  Filled 2021-05-19: qty 5

## 2021-05-19 MED ORDER — SODIUM CHLORIDE 0.9% FLUSH
10.0000 mL | INTRAVENOUS | Status: DC | PRN
  Administered 2021-05-19: 10 mL
  Filled 2021-05-19: qty 10

## 2021-05-22 DIAGNOSIS — C20 Malignant neoplasm of rectum: Secondary | ICD-10-CM | POA: Diagnosis not present

## 2021-05-28 ENCOUNTER — Other Ambulatory Visit: Payer: Self-pay | Admitting: Oncology

## 2021-05-31 ENCOUNTER — Inpatient Hospital Stay: Payer: BC Managed Care – PPO

## 2021-05-31 ENCOUNTER — Encounter: Payer: Self-pay | Admitting: Oncology

## 2021-05-31 ENCOUNTER — Encounter: Payer: Self-pay | Admitting: *Deleted

## 2021-05-31 ENCOUNTER — Inpatient Hospital Stay (HOSPITAL_BASED_OUTPATIENT_CLINIC_OR_DEPARTMENT_OTHER): Payer: BC Managed Care – PPO | Admitting: Oncology

## 2021-05-31 ENCOUNTER — Other Ambulatory Visit: Payer: Self-pay

## 2021-05-31 VITALS — BP 133/83 | HR 90 | Resp 18

## 2021-05-31 VITALS — BP 125/96 | HR 106 | Temp 98.0°F | Resp 18 | Wt 246.6 lb

## 2021-05-31 VITALS — BP 125/96 | HR 106 | Temp 98.0°F | Resp 18

## 2021-05-31 DIAGNOSIS — C20 Malignant neoplasm of rectum: Secondary | ICD-10-CM

## 2021-05-31 DIAGNOSIS — Z5111 Encounter for antineoplastic chemotherapy: Secondary | ICD-10-CM | POA: Diagnosis not present

## 2021-05-31 DIAGNOSIS — Z95828 Presence of other vascular implants and grafts: Secondary | ICD-10-CM

## 2021-05-31 DIAGNOSIS — Z452 Encounter for adjustment and management of vascular access device: Secondary | ICD-10-CM | POA: Diagnosis not present

## 2021-05-31 LAB — CBC WITH DIFFERENTIAL (CANCER CENTER ONLY)
Abs Immature Granulocytes: 0.03 10*3/uL (ref 0.00–0.07)
Basophils Absolute: 0 10*3/uL (ref 0.0–0.1)
Basophils Relative: 1 %
Eosinophils Absolute: 0.1 10*3/uL (ref 0.0–0.5)
Eosinophils Relative: 2 %
HCT: 43.2 % (ref 39.0–52.0)
Hemoglobin: 14 g/dL (ref 13.0–17.0)
Immature Granulocytes: 1 %
Lymphocytes Relative: 36 %
Lymphs Abs: 2.1 10*3/uL (ref 0.7–4.0)
MCH: 28.7 pg (ref 26.0–34.0)
MCHC: 32.4 g/dL (ref 30.0–36.0)
MCV: 88.7 fL (ref 80.0–100.0)
Monocytes Absolute: 0.9 10*3/uL (ref 0.1–1.0)
Monocytes Relative: 15 %
Neutro Abs: 2.8 10*3/uL (ref 1.7–7.7)
Neutrophils Relative %: 45 %
Platelet Count: 171 10*3/uL (ref 150–400)
RBC: 4.87 MIL/uL (ref 4.22–5.81)
RDW: 15.8 % — ABNORMAL HIGH (ref 11.5–15.5)
WBC Count: 5.9 10*3/uL (ref 4.0–10.5)
nRBC: 0 % (ref 0.0–0.2)

## 2021-05-31 LAB — CMP (CANCER CENTER ONLY)
ALT: 33 U/L (ref 0–44)
AST: 24 U/L (ref 15–41)
Albumin: 3.8 g/dL (ref 3.5–5.0)
Alkaline Phosphatase: 77 U/L (ref 38–126)
Anion gap: 8 (ref 5–15)
BUN: 10 mg/dL (ref 6–20)
CO2: 26 mmol/L (ref 22–32)
Calcium: 8.7 mg/dL — ABNORMAL LOW (ref 8.9–10.3)
Chloride: 103 mmol/L (ref 98–111)
Creatinine: 0.91 mg/dL (ref 0.61–1.24)
GFR, Estimated: >60 mL/min (ref >60)
Glucose, Bld: 129 mg/dL — ABNORMAL HIGH (ref 70–99)
Potassium: 4.6 mmol/L (ref 3.5–5.1)
Sodium: 137 mmol/L (ref 135–145)
Total Bilirubin: 0.5 mg/dL (ref 0.3–1.2)
Total Protein: 6.6 g/dL (ref 6.5–8.1)

## 2021-05-31 MED ORDER — PALONOSETRON HCL INJECTION 0.25 MG/5ML
0.2500 mg | Freq: Once | INTRAVENOUS | Status: AC
  Administered 2021-05-31: 0.25 mg via INTRAVENOUS
  Filled 2021-05-31: qty 5

## 2021-05-31 MED ORDER — SODIUM CHLORIDE 0.9 % IV SOLN
Freq: Once | INTRAVENOUS | Status: DC | PRN
  Filled 2021-05-31: qty 250

## 2021-05-31 MED ORDER — FLUOROURACIL CHEMO INJECTION 5 GM/100ML
2400.0000 mg/m2 | INTRAVENOUS | Status: DC
  Administered 2021-05-31: 5600 mg via INTRAVENOUS
  Filled 2021-05-31: qty 112

## 2021-05-31 MED ORDER — FAMOTIDINE 20 MG IN NS 100 ML IVPB
20.0000 mg | Freq: Once | INTRAVENOUS | Status: AC | PRN
  Administered 2021-05-31: 20 mg via INTRAVENOUS
  Filled 2021-05-31: qty 100

## 2021-05-31 MED ORDER — DEXTROSE 5 % IV SOLN
Freq: Once | INTRAVENOUS | Status: AC
  Filled 2021-05-31: qty 250

## 2021-05-31 MED ORDER — FLUOROURACIL CHEMO INJECTION 2.5 GM/50ML
400.0000 mg/m2 | Freq: Once | INTRAVENOUS | Status: AC
  Administered 2021-05-31: 950 mg via INTRAVENOUS
  Filled 2021-05-31: qty 19

## 2021-05-31 MED ORDER — METHYLPREDNISOLONE SODIUM SUCC 125 MG IJ SOLR: 125.0000 mg | Freq: Once | INTRAMUSCULAR | Status: DC | PRN

## 2021-05-31 MED ORDER — OXALIPLATIN CHEMO INJECTION 100 MG/20ML
85.0000 mg/m2 | Freq: Once | INTRAVENOUS | Status: AC
  Administered 2021-05-31: 200 mg via INTRAVENOUS
  Filled 2021-05-31: qty 40

## 2021-05-31 MED ORDER — DIPHENHYDRAMINE HCL 50 MG/ML IJ SOLN
50.0000 mg | Freq: Once | INTRAMUSCULAR | Status: AC | PRN
  Administered 2021-05-31: 25 mg via INTRAVENOUS

## 2021-05-31 MED ORDER — SODIUM CHLORIDE 0.9 % IV SOLN
10.0000 mg | Freq: Once | INTRAVENOUS | Status: AC
  Administered 2021-05-31: 10 mg via INTRAVENOUS
  Filled 2021-05-31: qty 1

## 2021-05-31 MED ORDER — LEUCOVORIN CALCIUM INJECTION 350 MG
400.0000 mg/m2 | Freq: Once | INTRAVENOUS | Status: AC
  Administered 2021-05-31: 932 mg via INTRAVENOUS
  Filled 2021-05-31: qty 46.6

## 2021-05-31 MED ORDER — ALTEPLASE 2 MG IJ SOLR
2.0000 mg | Freq: Once | INTRAMUSCULAR | Status: AC | PRN
  Administered 2021-05-31: 2 mg
  Filled 2021-05-31: qty 2

## 2021-05-31 NOTE — Progress Notes (Signed)
Per Dr. Benay Spice: Notified radiation oncology that last chemo is 8/23, so needs to move his RT visit/SIM to afterwards. Message to PA and Dr. Ida Rogue nurse.

## 2021-05-31 NOTE — Patient Instructions (Signed)
[facility]  Discharge Instructions: Thank you for choosing [facility] to provide your oncology and hematology care.   If you have a lab appointment with the [facility], please go directly to the [facility] and check in at the registration area.   Wear comfortable clothing and clothing appropriate for easy access to any Portacath or PICC line.   We strive to give you quality time with your provider. You may need to reschedule your appointment if you arrive late (15 or more minutes).  Arriving late affects you and other patients whose appointments are after yours.  Also, if you miss three or more appointments without notifying the office, you may be dismissed from the clinic at the provider's discretion.      For prescription refill requests, have your pharmacy contact our office and allow 72 hours for refills to be completed.    Today you received the following chemotherapy and/or immunotherapy agents oxaliplatin, leucovorin, fluorouracil   To help prevent nausea and vomiting after your treatment, we encourage you to take your nausea medication as directed.  BELOW ARE SYMPTOMS THAT SHOULD BE REPORTED IMMEDIATELY: *FEVER GREATER THAN 100.4 F (38 C) OR HIGHER *CHILLS OR SWEATING *NAUSEA AND VOMITING THAT IS NOT CONTROLLED WITH YOUR NAUSEA MEDICATION *UNUSUAL SHORTNESS OF BREATH *UNUSUAL BRUISING OR BLEEDING *URINARY PROBLEMS (pain or burning when urinating, or frequent urination) *BOWEL PROBLEMS (unusual diarrhea, constipation, pain near the anus) TENDERNESS IN MOUTH AND THROAT WITH OR WITHOUT PRESENCE OF ULCERS (sore throat, sores in mouth, or a toothache) UNUSUAL RASH, SWELLING OR PAIN  UNUSUAL VAGINAL DISCHARGE OR ITCHING   Items with * indicate a potential emergency and should be followed up as soon as possible or go to the Emergency Department if any problems should occur.  Please show the CHEMOTHERAPY ALERT CARD or IMMUNOTHERAPY ALERT  CARD at check-in to the Emergency Department and triage nurse.  Should you have questions after your visit or need to cancel or reschedule your appointment, please contact [facility]  Dept: [phone]  and follow the prompts.  Office hours are 8:00 a.m. to 4:30 p.m. Monday - Friday. Please note that voicemails left after 4:00 p.m. may not be returned until the following business day.  We are closed weekends and major holidays. You have access to a nurse at all times for urgent questions. Please call the main number to the clinic Dept: [phone] and follow the prompts.   For any non-urgent questions, you may also contact your provider using MyChart. We now offer e-Visits for anyone 28 and older to request care online for non-urgent symptoms. For details visit [email or web address].   Also download the MyChart app! Go to the app store, search "MyChart", open the app, select [facility], and log in with your MyChart username and password.  Due to Covid, a mask is required upon entering the hospital/clinic. If you do not have a mask, one will be given to you upon arrival. For doctor visits, patients may have 1 support person aged 46 or older with them. For treatment visits, patients cannot have anyone with them due to current Covid guidelines and our immunocompromised population.    Oxaliplatin Injection What is this medication? OXALIPLATIN (ox AL i PLA tin) is a chemotherapy drug. It targets fast dividing cells, like cancer cells, and causes these cells to die. This medicine is usedto treat cancers of the colon and rectum, and many other cancers. This medicine may be  used for other purposes; ask your health care provider orpharmacist if you have questions. COMMON BRAND NAME(S): Eloxatin What should I tell my care team before I take this medication? They need to know if you have any of these conditions: heart disease history of irregular heartbeat liver  disease low blood counts, like white cells, platelets, or red blood cells lung or breathing disease, like asthma take medicines that treat or prevent blood clots tingling of the fingers or toes, or other nerve disorder an unusual or allergic reaction to oxaliplatin, other chemotherapy, other medicines, foods, dyes, or preservatives pregnant or trying to get pregnant breast-feeding How should I use this medication? This drug is given as an infusion into a vein. It is administered in a hospitalor clinic by a specially trained health care professional. Talk to your pediatrician regarding the use of this medicine in children.Special care may be needed. Overdosage: If you think you have taken too much of this medicine contact apoison control center or emergency room at once. NOTE: This medicine is only for you. Do not share this medicine with others. What if I miss a dose? It is important not to miss a dose. Call your doctor or health careprofessional if you are unable to keep an appointment. What may interact with this medication? Do not take this medicine with any of the following medications: cisapride dronedarone pimozide thioridazine This medicine may also interact with the following medications: aspirin and aspirin-like medicines certain medicines that treat or prevent blood clots like warfarin, apixaban, dabigatran, and rivaroxaban cisplatin cyclosporine diuretics medicines for infection like acyclovir, adefovir, amphotericin B, bacitracin, cidofovir, foscarnet, ganciclovir, gentamicin, pentamidine, vancomycin NSAIDs, medicines for pain and inflammation, like ibuprofen or naproxen other medicines that prolong the QT interval (an abnormal heart rhythm) pamidronate zoledronic acid This list may not describe all possible interactions. Give your health care provider a list of all the medicines, herbs, non-prescription drugs, or dietary supplements you use. Also tell them if you smoke,  drink alcohol, or use illegaldrugs. Some items may interact with your medicine. What should I watch for while using this medication? Your condition will be monitored carefully while you are receiving thismedicine. You may need blood work done while you are taking this medicine. This medicine may make you feel generally unwell. This is not uncommon as chemotherapy can affect healthy cells as well as cancer cells. Report any side effects. Continue your course of treatment even though you feel ill unless yourhealthcare professional tells you to stop. This medicine can make you more sensitive to cold. Do not drink cold drinks or use ice. Cover exposed skin before coming in contact with cold temperatures or cold objects. When out in cold weather wear warm clothing and cover your mouth and nose to warm the air that goes into your lungs. Tell your doctor if you getsensitive to the cold. Do not become pregnant while taking this medicine or for 9 months after stopping it. Women should inform their health care professional if they wish to become pregnant or think they might be pregnant. Men should not father a child while taking this medicine and for 6 months after stopping it. There is potential for serious side effects to an unborn child. Talk to your health careprofessional for more information. Do not breast-feed a child while taking this medicine or for 3 months afterstopping it. This medicine has caused ovarian failure in some women. This medicine may make it more difficult to get pregnant. Talk to your health care professional  if youare concerned about your fertility. This medicine has caused decreased sperm counts in some men. This may make it more difficult to father a child. Talk to your health care professional if [place] S[place] concerned about your fertility. This medicine may increase your risk of getting an infection. Call your health care professional for advice if you get a fever, chills, or sore throat, or  other symptoms of a cold or flu. Do not treat yourself. Try to avoid beingaround people who are sick. Avoid taking medicines that contain aspirin, acetaminophen, ibuprofen, naproxen, or ketoprofen unless instructed by your health care professional.These medicines may hide a fever. Be careful brushing or flossing your teeth or using a toothpick because you may get an infection or bleed more easily. If you have any dental work done, Primary school teacher you are receiving this medicine. What side effects may I notice from receiving this medication? Side effects that you should report to your doctor or health care professionalas soon as possible: allergic reactions like skin rash, itching or hives, swelling of the face, lips, or tongue breathing problems cough low blood counts - this medicine may decrease the number of white blood cells, red blood cells, and platelets. You may be at increased risk for infections and bleeding nausea, vomiting pain, redness, or irritation at site where injected pain, tingling, numbness in the hands or feet signs and symptoms of bleeding such as bloody or black, tarry stools; red or dark brown urine; spitting up blood or brown material that looks like coffee grounds; red spots on the skin; unusual bruising or bleeding from the eyes, gums, or nose signs and symptoms of a dangerous change in heartbeat or heart rhythm like chest pain; dizziness; fast, irregular heartbeat; palpitations; feeling faint or lightheaded; falls signs and symptoms of infection like fever; chills; cough; sore throat; pain or trouble passing urine signs and symptoms of liver injury like dark yellow or brown urine; general ill feeling or flu-like symptoms; light-colored stools; loss of appetite; nausea; right upper belly pain; unusually weak or tired; yellowing of the eyes or skin signs and symptoms of low red blood cells or anemia such as unusually weak or tired; feeling faint or lightheaded; falls signs  and symptoms of muscle injury like dark urine; trouble passing urine or change in the amount of urine; unusually weak or tired; muscle pain; back pain Side effects that usually do not require medical attention (report to yourdoctor or health care professional if they continue or are bothersome): changes in taste diarrhea gas hair loss loss of appetite mouth sores This list may not describe all possible side effects. Call your doctor for medical advice about side effects. You may report side effects to FDA at1-800-FDA-1088. Where should I keep my medication? This drug is given in a hospital or clinic and will not be stored at home. NOTE: This sheet is a summary. It may not cover all possible information. If you have questions about this medicine, talk to your doctor, pharmacist, orhealth care provider.  2022 Elsevier/Gold Standard (2019-03-12 12:20:35)  Leucovorin injection What is this medication? LEUCOVORIN (loo koe VOR in) is used to prevent or treat the harmful effects of some medicines. This medicine is used to treat anemia caused by a low amount of folic acid in the body. It is also used with 5-fluorouracil (5-FU) to treatcolon cancer. This medicine may be used for other purposes; ask your health care provider orpharmacist if you have questions. What should I tell my care team before I  take this medication? They need to know if you have any of these conditions: anemia from low levels of vitamin B-12 in the blood an unusual or allergic reaction to leucovorin, folic acid, other medicines, foods, dyes, or preservatives pregnant or trying to get pregnant breast-feeding How should I use this medication? This medicine is for injection into a muscle or into a vein. It is given by ahealth care professional in a hospital or clinic setting. Talk to your pediatrician regarding the use of this medicine in children.Special care may be needed. Overdosage: If you think you have taken too much of  this medicine contact apoison control center or emergency room at once. NOTE: This medicine is only for you. Do not share this medicine with others. What if I miss a dose? This does not apply. What may interact with this medication? capecitabine fluorouracil phenobarbital phenytoin primidone trimethoprim-sulfamethoxazole This list may not describe all possible interactions. Give your health care provider a list of all the medicines, herbs, non-prescription drugs, or dietary supplements you use. Also tell them if you smoke, drink alcohol, or use illegaldrugs. Some items may interact with your medicine. What should I watch for while using this medication? Your condition will be monitored carefully while you are receiving thismedicine. This medicine may increase the side effects of 5-fluorouracil, 5-FU. Tell your doctor or health care professional if you have diarrhea or mouth sores that donot get better or that get worse. What side effects may I notice from receiving this medication? Side effects that you should report to your doctor or health care professionalas soon as possible: allergic reactions like skin rash, itching or hives, swelling of the face, lips, or tongue breathing problems fever, infection mouth sores unusual bleeding or bruising unusually weak or tired Side effects that usually do not require medical attention (report to yourdoctor or health care professional if they continue or are bothersome): constipation or diarrhea loss of appetite nausea, vomiting This list may not describe all possible side effects. Call your doctor for medical advice about side effects. You may report side effects to FDA at1-800-FDA-1088. Where should I keep my medication? This drug is given in a hospital or clinic and will not be stored at home. NOTE: This sheet is a summary. It may not cover all possible information. If you have questions about this medicine, talk to your doctor, pharmacist,  orhealth care provider.  2022 Elsevier/Gold Standard (2008-04-28 16:50:29)  Fluorouracil, 5-FU injection What is this medication? FLUOROURACIL, 5-FU (flure oh YOOR a sil) is a chemotherapy drug. It slows the growth of cancer cells. This medicine is used to treat many types of cancer like breast cancer, colon or rectal cancer, pancreatic cancer, and stomachcancer. This medicine may be used for other purposes; ask your health care provider orpharmacist if you have questions. COMMON BRAND NAME(S): Adrucil What should I tell my care team before I take this medication? They need to know if you have any of these conditions: blood disorders dihydropyrimidine dehydrogenase (DPD) deficiency infection (especially a virus infection such as chickenpox, cold sores, or herpes) kidney disease liver disease malnourished, poor nutrition recent or ongoing radiation therapy an unusual or allergic reaction to fluorouracil, other chemotherapy, other medicines, foods, dyes, or preservatives pregnant or trying to get pregnant breast-feeding How should I use this medication? This drug is given as an infusion or injection into a vein. It is administeredin a hospital or clinic by a specially trained health care professional. Talk to your pediatrician regarding the use of  this medicine in children.Special care may be needed. Overdosage: If you think you have taken too much of this medicine contact apoison control center or emergency room at once. NOTE: This medicine is only for you. Do not share this medicine with others. What if I miss a dose? It is important not to miss your dose. Call your doctor or health careprofessional if you are unable to keep an appointment. What may interact with this medication? Do not take this medicine with any of the following medications: live virus vaccines This medicine may also interact with the following medications: medicines that treat or prevent blood clots like warfarin,  enoxaparin, and dalteparin This list may not describe all possible interactions. Give your health care provider a list of all the medicines, herbs, non-prescription drugs, or dietary supplements you use. Also tell them if you smoke, drink alcohol, or use illegaldrugs. Some items may interact with your medicine. What should I watch for while using this medication? Visit your doctor for checks on your progress. This drug may make you feel generally unwell. This is not uncommon, as chemotherapy can affect healthy cells as well as cancer cells. Report any side effects. Continue your course oftreatment even though you feel ill unless your doctor tells you to stop. In some cases, you may be given additional medicines to help with side effects.Follow all directions for their use. Call your doctor or health care professional for advice if you get a fever, chills or sore throat, or other symptoms of a cold or flu. Do not treat yourself. This drug decreases your body's ability to fight infections. Try toavoid being around people who are sick. This medicine may increase your risk to bruise or bleed. Call your doctor orhealth care professional if you notice any unusual bleeding. Be careful brushing and flossing your teeth or using a toothpick because you may get an infection or bleed more easily. If you have any dental work done,tell your dentist you are receiving this medicine. Avoid taking products that contain aspirin, acetaminophen, ibuprofen, naproxen, or ketoprofen unless instructed by your doctor. These medicines may hide afever. Do not become pregnant while taking this medicine. Women should inform their doctor if they wish to become pregnant or think they might be pregnant. There is a potential for serious side effects to an unborn child. Talk to your health care professional or pharmacist for more information. Do not breast-feed aninfant while taking this medicine. Men should inform their doctor if they wish  to father a child. This medicinemay lower sperm counts. Do not treat diarrhea with over the counter products. Contact your doctor ifyou have diarrhea that lasts more than 2 days or if it is severe and watery. This medicine can make you more sensitive to the sun. Keep out of the sun. If you cannot avoid being in the sun, wear protective clothing and use sunscreen.Do not use sun lamps or tanning beds/booths. What side effects may I notice from receiving this medication? Side effects that you should report to your doctor or health care professionalas soon as possible: allergic reactions like skin rash, itching or hives, swelling of the face, lips, or tongue low blood counts - this medicine may decrease the number of white blood cells, red blood cells and platelets. You may be at increased risk for infections and bleeding. signs of infection - fever or chills, cough, sore throat, pain or difficulty passing urine signs of decreased platelets or bleeding - bruising, pinpoint red spots on the skin, black,  tarry stools, blood in the urine signs of decreased red blood cells - unusually weak or tired, fainting spells, lightheadedness breathing problems changes in vision chest pain mouth sores nausea and vomiting pain, swelling, redness at site where injected pain, tingling, numbness in the hands or feet redness, swelling, or sores on hands or feet stomach pain unusual bleeding Side effects that usually do not require medical attention (report to yourdoctor or health care professional if they continue or are bothersome): changes in finger or toe nails diarrhea dry or itchy skin hair loss headache loss of appetite sensitivity of eyes to the light stomach upset unusually teary eyes This list may not describe all possible side effects. Call your doctor for medical advice about side effects. You may report side effects to FDA at1-800-FDA-1088. Where should I keep my medication? This drug is given in  a hospital or clinic and will not be stored at home. NOTE: This sheet is a summary. It may not cover all possible information. If you have questions about this medicine, talk to your doctor, pharmacist, orhealth care provider.  2022 Elsevier/Gold Standard (2019-09-23 15:00:03)

## 2021-05-31 NOTE — Progress Notes (Signed)
1200 Pt noted to be clearing throat and congested numerous times.  Infusion of oxaliplatin and lucovorin paused. Pt states he is just a little congested but feels fine, no tightness in the throat or swelling of tongue. VS obtained and infusion restarted.   1215 Pt reassessed, pt continued to clear throat, sneezing and coughing pt became flushed and complained of eyes watering. Infusion stopped again, normal saline started, vital signs obtained and Leander Rams, NP called to bedside. Benadryl 25 mg give per Hypersensitivity Reaction Protocol Worksheet with Leander Rams, NP at bedside.  Continue IV fluids and monitor patient. 38 Dr Benay Spice and Leander Rams, NP at bedside to assess patient. Vital signs obtained. Ordered to give Pepcid 20 mg then continue Oxaliplatin at half the rate.  1345 Oxaliplatin and Leucovorin infusion restarted. Pt states that he is doing well.  Will continue to monitor patient.  1500 Pt completed infusion without any other reaction symptoms. Pt discharged without further incident

## 2021-05-31 NOTE — Progress Notes (Addendum)
[place] OFFICE PROGRESS NOTE   Diagnosis: Rectal cancer  INTERVAL HISTORY:   Eric Lewis completed another cycle of FOLFOX on 05/17/2021.  He reports 1 episode of nausea on day 2.  No emesis.  Cold sensitivity lasted for 1 week following chemotherapy.  No neuropathy symptoms at present.  He reports a baseline heart rate of approximately 100 that increases to 120 for a few days following chemotherapy.  No dyspnea or chest pain.  Occasional mild diarrhea.  Objective:  Vital signs in last 24 hours:  Blood pressure (!) 125/96, pulse (!) 106, temperature 98 F (36.7 C), temperature source Temporal, resp. rate 18, weight 246 lb 9.6 oz (111.9 kg), SpO2 99 %.    HEENT: No thrush or ulcers Resp: Lungs clear bilaterally Cardio: Regular rate and rhythm GI: No hepatosplenomegaly, nontender Vascular: No leg edema Neuro: No loss of vibratory sense at most of the fingertips, very mild loss of vibratory sense at several fingertips bilaterally    Portacath/PICC-without erythema  Lab Results:  Lab Results  Component Value Date   WBC 5.9 05/31/2021   HGB 14.0 05/31/2021   HCT 43.2 05/31/2021   MCV 88.7 05/31/2021   PLT 171 05/31/2021   NEUTROABS 2.8 05/31/2021    CMP  Lab Results  Component Value Date   NA 137 05/31/2021   K 4.6 05/31/2021   CL 103 05/31/2021   CO2 26 05/31/2021   GLUCOSE 129 (H) 05/31/2021   BUN 10 05/31/2021   CREATININE 0.91 05/31/2021   CALCIUM 8.7 (L) 05/31/2021   PROT 6.6 05/31/2021   ALBUMIN 3.8 05/31/2021   AST 24 05/31/2021   ALT 33 05/31/2021   ALKPHOS 77 05/31/2021   BILITOT 0.5 05/31/2021   GFRNONAA >60 05/31/2021   GFRAA >60 05/30/2016    Lab Results  Component Value Date   CEA 4.12 03/22/2021     Medications: I have reviewed the patient's current medications.   Assessment/Plan: Rectal cancer Colonoscopy 02/15/2021- nonobstructing mass at 2-3 cm in the anal verge, removed in a piecemeal fashion, pathology confirmed  moderately differentiated adenocarcinoma CTs chest, abdomen, and pelvis on 02/17/2021- no evidence of metastatic disease, no clear mass identified, areas of mild rectal wall thickening MRI 03/02/2021- rectal tumor not identified, no extension beyond the muscularis identified.  N1 disease with multiple left mesorectal nodes, and a 7 mm node beyond the mesorectum at the hypogastric region Cycle 1 FOLFOX 03/22/2021 Cycle 2 FOLFOX 04/05/2021 Cycle 3 FOLFOX 04/19/2021 Cycle 4 FOLFOX 05/03/2021 Cycle 5 FOLFOX 05/17/2021 Cycle 6 FOLFOX 05/31/2021   History of dysphagia-status post a barium swallow 10/22/2020- mild smooth stricture at the GE junction Psoriatic arthritis Diabetes COVID-19 January 22    Disposition: Eric Lewis has completed 5 cycles of FOLFOX.  He has tolerated the chemotherapy well.  He will complete cycle 6 today.  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  We will monitor for progressive neuropathy symptoms and discontinue oxaliplatin as indicated.  He will return for an office visit and chemotherapy in 2 weeks.  The plan is to initiate concurrent capecitabine and radiation approximately 2 weeks following the completion of chemotherapy.  Betsy Coder, MD  05/31/2021  9:23 AM  I was called from the infusion area to see Eric Lewis regarding a possible reaction.  The nurse reports he began coughing, sneezing and clearing his throat.  He became diaphoretic.  Eyes watery.  Oxaliplatin was placed on hold.  Vital signs stable.  He denies wheezing, shortness of  breath.  Throat does not feel tight.  No rash.  No pruritus.  He was given Benadryl 25 mg IV.  Symptoms subsequently resolved.  Question acute laryngeal pharyngeal dysesthesia versus allergic reaction.  We discussed resuming the infusion versus holding for this cycle.  He would like to try resuming the oxaliplatin.  He understands the symptoms could recur.  He will receive Pepcid 20 mg IV.  Oxaliplatin will be resumed  at one half the previous rate with the plan to discontinue if symptoms recur.  Ned Card, NP

## 2021-06-01 ENCOUNTER — Telehealth: Payer: Self-pay

## 2021-06-01 NOTE — Telephone Encounter (Signed)
Follow up telephone call made to patient post his Folfox treatment hypersensitivity reaction yesterday, and after hours voice message he left reporting fever, nausea and vomiting.    Patient stated today that "I generally feel decent but not great" He reports having nausea/vomiting last night and took his compazine. He denies any nausea or vomiting today. Had Tylenol last night for his fever and has not had any fever today. Last temperature obtained at 0830 this morning was 98.4. Patient does admit to feeling tired and was currently lying in bed as we spoke.  Patient denies any congestion, cough, difficulty breathing or SOB. He denies any numbness, tingling or cold sensitivity. He denies chest pain, dizziness and feeling faint.  Advised patient to get tested for Covid if fever returns and inform clinic so that he can be referred to the cancer center Covid clinic for further evaluation/treatment, patient was agreeable and verbalized understanding.  Patient knows to call with any further complains or concerns.

## 2021-06-02 ENCOUNTER — Other Ambulatory Visit: Payer: Self-pay

## 2021-06-02 ENCOUNTER — Inpatient Hospital Stay: Payer: BC Managed Care – PPO

## 2021-06-02 VITALS — BP 138/98 | HR 125 | Temp 97.5°F | Resp 18

## 2021-06-02 DIAGNOSIS — Z452 Encounter for adjustment and management of vascular access device: Secondary | ICD-10-CM | POA: Diagnosis not present

## 2021-06-02 DIAGNOSIS — C20 Malignant neoplasm of rectum: Secondary | ICD-10-CM

## 2021-06-02 DIAGNOSIS — Z5111 Encounter for antineoplastic chemotherapy: Secondary | ICD-10-CM | POA: Diagnosis not present

## 2021-06-02 MED ORDER — HEPARIN SOD (PORK) LOCK FLUSH 100 UNIT/ML IV SOLN
500.0000 [IU] | Freq: Once | INTRAVENOUS | Status: AC | PRN
  Administered 2021-06-02: 500 [IU]
  Filled 2021-06-02: qty 5

## 2021-06-02 MED ORDER — SODIUM CHLORIDE 0.9% FLUSH
10.0000 mL | INTRAVENOUS | Status: DC | PRN
  Administered 2021-06-02: 10 mL
  Filled 2021-06-02: qty 10

## 2021-06-03 ENCOUNTER — Inpatient Hospital Stay: Payer: BC Managed Care – PPO

## 2021-06-08 ENCOUNTER — Other Ambulatory Visit: Payer: Self-pay | Admitting: Oncology

## 2021-06-14 ENCOUNTER — Inpatient Hospital Stay: Payer: BC Managed Care – PPO | Attending: Oncology

## 2021-06-14 ENCOUNTER — Inpatient Hospital Stay (HOSPITAL_BASED_OUTPATIENT_CLINIC_OR_DEPARTMENT_OTHER): Payer: BC Managed Care – PPO | Admitting: Nurse Practitioner

## 2021-06-14 ENCOUNTER — Inpatient Hospital Stay: Payer: BC Managed Care – PPO

## 2021-06-14 ENCOUNTER — Encounter: Payer: Self-pay | Admitting: Nurse Practitioner

## 2021-06-14 ENCOUNTER — Other Ambulatory Visit: Payer: Self-pay

## 2021-06-14 VITALS — BP 135/81 | HR 100 | Temp 98.1°F | Resp 20 | Ht 67.0 in | Wt 245.8 lb

## 2021-06-14 DIAGNOSIS — Z8719 Personal history of other diseases of the digestive system: Secondary | ICD-10-CM | POA: Diagnosis not present

## 2021-06-14 DIAGNOSIS — E119 Type 2 diabetes mellitus without complications: Secondary | ICD-10-CM | POA: Insufficient documentation

## 2021-06-14 DIAGNOSIS — R059 Cough, unspecified: Secondary | ICD-10-CM | POA: Insufficient documentation

## 2021-06-14 DIAGNOSIS — C20 Malignant neoplasm of rectum: Secondary | ICD-10-CM

## 2021-06-14 DIAGNOSIS — Z8616 Personal history of COVID-19: Secondary | ICD-10-CM | POA: Insufficient documentation

## 2021-06-14 DIAGNOSIS — Z5111 Encounter for antineoplastic chemotherapy: Secondary | ICD-10-CM | POA: Insufficient documentation

## 2021-06-14 DIAGNOSIS — Z79899 Other long term (current) drug therapy: Secondary | ICD-10-CM | POA: Insufficient documentation

## 2021-06-14 DIAGNOSIS — R067 Sneezing: Secondary | ICD-10-CM | POA: Diagnosis not present

## 2021-06-14 DIAGNOSIS — R61 Generalized hyperhidrosis: Secondary | ICD-10-CM | POA: Diagnosis not present

## 2021-06-14 DIAGNOSIS — L4050 Arthropathic psoriasis, unspecified: Secondary | ICD-10-CM | POA: Diagnosis not present

## 2021-06-14 LAB — CBC WITH DIFFERENTIAL (CANCER CENTER ONLY)
Abs Immature Granulocytes: 0.03 10*3/uL (ref 0.00–0.07)
Basophils Absolute: 0.1 10*3/uL (ref 0.0–0.1)
Basophils Relative: 1 %
Eosinophils Absolute: 0.2 10*3/uL (ref 0.0–0.5)
Eosinophils Relative: 2 %
HCT: 43 % (ref 39.0–52.0)
Hemoglobin: 13.6 g/dL (ref 13.0–17.0)
Immature Granulocytes: 1 %
Lymphocytes Relative: 32 %
Lymphs Abs: 2.1 10*3/uL (ref 0.7–4.0)
MCH: 28.1 pg (ref 26.0–34.0)
MCHC: 31.6 g/dL (ref 30.0–36.0)
MCV: 88.8 fL (ref 80.0–100.0)
Monocytes Absolute: 1 10*3/uL (ref 0.1–1.0)
Monocytes Relative: 16 %
Neutro Abs: 3.2 10*3/uL (ref 1.7–7.7)
Neutrophils Relative %: 48 %
Platelet Count: 155 10*3/uL (ref 150–400)
RBC: 4.84 MIL/uL (ref 4.22–5.81)
RDW: 16.2 % — ABNORMAL HIGH (ref 11.5–15.5)
WBC Count: 6.6 10*3/uL (ref 4.0–10.5)
nRBC: 0 % (ref 0.0–0.2)

## 2021-06-14 LAB — CMP (CANCER CENTER ONLY)
ALT: 27 U/L (ref 0–44)
AST: 19 U/L (ref 15–41)
Albumin: 3.9 g/dL (ref 3.5–5.0)
Alkaline Phosphatase: 85 U/L (ref 38–126)
Anion gap: 9 (ref 5–15)
BUN: 12 mg/dL (ref 6–20)
CO2: 27 mmol/L (ref 22–32)
Calcium: 8.8 mg/dL — ABNORMAL LOW (ref 8.9–10.3)
Chloride: 102 mmol/L (ref 98–111)
Creatinine: 0.88 mg/dL (ref 0.61–1.24)
GFR, Estimated: >60 mL/min (ref >60)
Glucose, Bld: 150 mg/dL — ABNORMAL HIGH (ref 70–99)
Potassium: 4.3 mmol/L (ref 3.5–5.1)
Sodium: 138 mmol/L (ref 135–145)
Total Bilirubin: 0.5 mg/dL (ref 0.3–1.2)
Total Protein: 6.6 g/dL (ref 6.5–8.1)

## 2021-06-14 MED ORDER — SODIUM CHLORIDE 0.9% FLUSH
10.0000 mL | INTRAVENOUS | Status: DC | PRN
  Filled 2021-06-14: qty 10

## 2021-06-14 MED ORDER — FLUOROURACIL CHEMO INJECTION 2.5 GM/50ML
400.0000 mg/m2 | Freq: Once | INTRAVENOUS | Status: AC
  Administered 2021-06-14: 950 mg via INTRAVENOUS
  Filled 2021-06-14: qty 19

## 2021-06-14 MED ORDER — SODIUM CHLORIDE 0.9 % IV SOLN
10.0000 mg | Freq: Once | INTRAVENOUS | Status: AC
  Administered 2021-06-14: 10 mg via INTRAVENOUS
  Filled 2021-06-14: qty 1

## 2021-06-14 MED ORDER — PALONOSETRON HCL INJECTION 0.25 MG/5ML
0.2500 mg | Freq: Once | INTRAVENOUS | Status: AC
  Administered 2021-06-14: 0.25 mg via INTRAVENOUS
  Filled 2021-06-14: qty 5

## 2021-06-14 MED ORDER — OXALIPLATIN CHEMO INJECTION 100 MG/20ML
85.0000 mg/m2 | Freq: Once | INTRAVENOUS | Status: AC
  Administered 2021-06-14: 200 mg via INTRAVENOUS
  Filled 2021-06-14: qty 40

## 2021-06-14 MED ORDER — LEUCOVORIN CALCIUM INJECTION 350 MG
400.0000 mg/m2 | Freq: Once | INTRAVENOUS | Status: AC
  Administered 2021-06-14: 932 mg via INTRAVENOUS
  Filled 2021-06-14: qty 46.6

## 2021-06-14 MED ORDER — DEXTROSE 5 % IV SOLN
Freq: Once | INTRAVENOUS | Status: AC
  Filled 2021-06-14: qty 250

## 2021-06-14 MED ORDER — SODIUM CHLORIDE 0.9 % IV SOLN
2400.0000 mg/m2 | INTRAVENOUS | Status: DC
  Administered 2021-06-14: 5600 mg via INTRAVENOUS
  Filled 2021-06-14: qty 112

## 2021-06-14 MED ORDER — DIPHENHYDRAMINE HCL 50 MG/ML IJ SOLN
25.0000 mg | Freq: Once | INTRAMUSCULAR | Status: AC
  Administered 2021-06-14: 25 mg via INTRAVENOUS
  Filled 2021-06-14: qty 1

## 2021-06-14 MED ORDER — FAMOTIDINE 20 MG IN NS 100 ML IVPB
20.0000 mg | Freq: Once | INTRAVENOUS | Status: AC
  Administered 2021-06-14: 20 mg via INTRAVENOUS
  Filled 2021-06-14: qty 100

## 2021-06-14 NOTE — Patient Instructions (Signed)
[facility]  Discharge Instructions: Thank you for choosing [facility] to provide your oncology and hematology care.   If you have a lab appointment with the [facility], please go directly to the [facility] and check in at the registration area.   Wear comfortable clothing and clothing appropriate for easy access to any Portacath or PICC line.   We strive to give you quality time with your provider. You may need to reschedule your appointment if you arrive late (15 or more minutes).  Arriving late affects you and other patients whose appointments are after yours.  Also, if you miss three or more appointments without notifying the office, you may be dismissed from the clinic at the provider's discretion.      For prescription refill requests, have your pharmacy contact our office and allow 72 hours for refills to be completed.    Today you received the following chemotherapy and/or immunotherapy agents oxaliplatin, leucovorin, fluorouracil   To help prevent nausea and vomiting after your treatment, we encourage you to take your nausea medication as directed.  BELOW ARE SYMPTOMS THAT SHOULD BE REPORTED IMMEDIATELY: *FEVER GREATER THAN 100.4 F (38 C) OR HIGHER *CHILLS OR SWEATING *NAUSEA AND VOMITING THAT IS NOT CONTROLLED WITH YOUR NAUSEA MEDICATION *UNUSUAL SHORTNESS OF BREATH *UNUSUAL BRUISING OR BLEEDING *URINARY PROBLEMS (pain or burning when urinating, or frequent urination) *BOWEL PROBLEMS (unusual diarrhea, constipation, pain near the anus) TENDERNESS IN MOUTH AND THROAT WITH OR WITHOUT PRESENCE OF ULCERS (sore throat, sores in mouth, or a toothache) UNUSUAL RASH, SWELLING OR PAIN  UNUSUAL VAGINAL DISCHARGE OR ITCHING   Items with * indicate a potential emergency and should be followed up as soon as possible or go to the Emergency Department if any problems should occur.  Please show the CHEMOTHERAPY ALERT CARD or IMMUNOTHERAPY ALERT  CARD at check-in to the Emergency Department and triage nurse.  Should you have questions after your visit or need to cancel or reschedule your appointment, please contact [facility]  Dept: [phone]  and follow the prompts.  Office hours are 8:00 a.m. to 4:30 p.m. Monday - Friday. Please note that voicemails left after 4:00 p.m. may not be returned until the following business day.  We are closed weekends and major holidays. You have access to a nurse at all times for urgent questions. Please call the main number to the clinic Dept: [phone] and follow the prompts.   For any non-urgent questions, you may also contact your provider using MyChart. We now offer e-Visits for anyone 25 and older to request care online for non-urgent symptoms. For details visit [email or web address].   Also download the MyChart app! Go to the app store, search "MyChart", open the app, select [facility], and log in with your MyChart username and password.  Due to Covid, a mask is required upon entering the hospital/clinic. If you do not have a mask, one will be given to you upon arrival. For doctor visits, patients may have 1 support person aged 68 or older with them. For treatment visits, patients cannot have anyone with them due to current Covid guidelines and our immunocompromised population.    Oxaliplatin Injection What is this medication? OXALIPLATIN (ox AL i PLA tin) is a chemotherapy drug. It targets fast dividing cells, like cancer cells, and causes these cells to die. This medicine is usedto treat cancers of the colon and rectum, and many other cancers. This medicine may be  used for other purposes; ask your health care provider orpharmacist if you have questions. COMMON BRAND NAME(S): Eloxatin What should I tell my care team before I take this medication? They need to know if you have any of these conditions: heart disease history of irregular heartbeat liver  disease low blood counts, like white cells, platelets, or red blood cells lung or breathing disease, like asthma take medicines that treat or prevent blood clots tingling of the fingers or toes, or other nerve disorder an unusual or allergic reaction to oxaliplatin, other chemotherapy, other medicines, foods, dyes, or preservatives pregnant or trying to get pregnant breast-feeding How should I use this medication? This drug is given as an infusion into a vein. It is administered in a hospitalor clinic by a specially trained health care professional. Talk to your pediatrician regarding the use of this medicine in children.Special care may be needed. Overdosage: If you think you have taken too much of this medicine contact apoison control center or emergency room at once. NOTE: This medicine is only for you. Do not share this medicine with others. What if I miss a dose? It is important not to miss a dose. Call your doctor or health careprofessional if you are unable to keep an appointment. What may interact with this medication? Do not take this medicine with any of the following medications: cisapride dronedarone pimozide thioridazine This medicine may also interact with the following medications: aspirin and aspirin-like medicines certain medicines that treat or prevent blood clots like warfarin, apixaban, dabigatran, and rivaroxaban cisplatin cyclosporine diuretics medicines for infection like acyclovir, adefovir, amphotericin B, bacitracin, cidofovir, foscarnet, ganciclovir, gentamicin, pentamidine, vancomycin NSAIDs, medicines for pain and inflammation, like ibuprofen or naproxen other medicines that prolong the QT interval (an abnormal heart rhythm) pamidronate zoledronic acid This list may not describe all possible interactions. Give your health care provider a list of all the medicines, herbs, non-prescription drugs, or dietary supplements you use. Also tell them if you smoke,  drink alcohol, or use illegaldrugs. Some items may interact with your medicine. What should I watch for while using this medication? Your condition will be monitored carefully while you are receiving thismedicine. You may need blood work done while you are taking this medicine. This medicine may make you feel generally unwell. This is not uncommon as chemotherapy can affect healthy cells as well as cancer cells. Report any side effects. Continue your course of treatment even though you feel ill unless yourhealthcare professional tells you to stop. This medicine can make you more sensitive to cold. Do not drink cold drinks or use ice. Cover exposed skin before coming in contact with cold temperatures or cold objects. When out in cold weather wear warm clothing and cover your mouth and nose to warm the air that goes into your lungs. Tell your doctor if you getsensitive to the cold. Do not become pregnant while taking this medicine or for 9 months after stopping it. Women should inform their health care professional if they wish to become pregnant or think they might be pregnant. Men should not father a child while taking this medicine and for 6 months after stopping it. There is potential for serious side effects to an unborn child. Talk to your health careprofessional for more information. Do not breast-feed a child while taking this medicine or for 3 months afterstopping it. This medicine has caused ovarian failure in some women. This medicine may make it more difficult to get pregnant. Talk to your health care professional  if youare concerned about your fertility. This medicine has caused decreased sperm counts in some men. This may make it more difficult to father a child. Talk to your health care professional if [place] concerned about your fertility. This medicine may increase your risk of getting an infection. Call your health care professional for advice if you get a fever, chills, or sore throat, or  other symptoms of a cold or flu. Do not treat yourself. Try to avoid beingaround people who are sick. Avoid taking medicines that contain aspirin, acetaminophen, ibuprofen, naproxen, or ketoprofen unless instructed by your health care professional.These medicines may hide a fever. Be careful brushing or flossing your teeth or using a toothpick because you may get an infection or bleed more easily. If you have any dental work done, Primary school teacher you are receiving this medicine. What side effects may I notice from receiving this medication? Side effects that you should report to your doctor or health care professionalas soon as possible: allergic reactions like skin rash, itching or hives, swelling of the face, lips, or tongue breathing problems cough low blood counts - this medicine may decrease the number of white blood cells, red blood cells, and platelets. You may be at increased risk for infections and bleeding nausea, vomiting pain, redness, or irritation at site where injected pain, tingling, numbness in the hands or feet signs and symptoms of bleeding such as bloody or black, tarry stools; red or dark brown urine; spitting up blood or brown material that looks like coffee grounds; red spots on the skin; unusual bruising or bleeding from the eyes, gums, or nose signs and symptoms of a dangerous change in heartbeat or heart rhythm like chest pain; dizziness; fast, irregular heartbeat; palpitations; feeling faint or lightheaded; falls signs and symptoms of infection like fever; chills; cough; sore throat; pain or trouble passing urine signs and symptoms of liver injury like dark yellow or brown urine; general ill feeling or flu-like symptoms; light-colored stools; loss of appetite; nausea; right upper belly pain; unusually weak or tired; yellowing of the eyes or skin signs and symptoms of low red blood cells or anemia such as unusually weak or tired; feeling faint or lightheaded; falls signs  and symptoms of muscle injury like dark urine; trouble passing urine or change in the amount of urine; unusually weak or tired; muscle pain; back pain Side effects that usually do not require medical attention (report to yourdoctor or health care professional if they continue or are bothersome): changes in taste diarrhea gas hair loss loss of appetite mouth sores This list may not describe all possible side effects. Call your doctor for medical advice about side effects. You may report side effects to FDA at1-800-FDA-1088. Where should I keep my medication? This drug is given in a hospital or clinic and will not be stored at home. NOTE: This sheet is a summary. It may not cover all possible information. If you have questions about this medicine, talk to your doctor, pharmacist, orhealth care provider.  2022 Elsevier/Gold Standard (2019-03-12 12:20:35)  Leucovorin injection What is this medication? LEUCOVORIN (loo koe VOR in) is used to prevent or treat the harmful effects of some medicines. This medicine is used to treat anemia caused by a low amount of folic acid in the body. It is also used with 5-fluorouracil (5-FU) to treatcolon cancer. This medicine may be used for other purposes; ask your health care provider orpharmacist if you have questions. What should I tell my care team before I  take this medication? They need to know if you have any of these conditions: anemia from low levels of vitamin B-12 in the blood an unusual or allergic reaction to leucovorin, folic acid, other medicines, foods, dyes, or preservatives pregnant or trying to get pregnant breast-feeding How should I use this medication? This medicine is for injection into a muscle or into a vein. It is given by ahealth care professional in a hospital or clinic setting. Talk to your pediatrician regarding the use of this medicine in children.Special care may be needed. Overdosage: If you think you have taken too much of  this medicine contact apoison control center or emergency room at once. NOTE: This medicine is only for you. Do not share this medicine with others. What if I miss a dose? This does not apply. What may interact with this medication? capecitabine fluorouracil phenobarbital phenytoin primidone trimethoprim-sulfamethoxazole This list may not describe all possible interactions. Give your health care provider a list of all the medicines, herbs, non-prescription drugs, or dietary supplements you use. Also tell them if you smoke, drink alcohol, or use illegaldrugs. Some items may interact with your medicine. What should I watch for while using this medication? Your condition will be monitored carefully while you are receiving thismedicine. This medicine may increase the side effects of 5-fluorouracil, 5-FU. Tell your doctor or health care professional if you have diarrhea or mouth sores that donot get better or that get worse. What side effects may I notice from receiving this medication? Side effects that you should report to your doctor or health care professionalas soon as possible: allergic reactions like skin rash, itching or hives, swelling of the face, lips, or tongue breathing problems fever, infection mouth sores unusual bleeding or bruising unusually weak or tired Side effects that usually do not require medical attention (report to yourdoctor or health care professional if they continue or are bothersome): constipation or diarrhea loss of appetite nausea, vomiting This list may not describe all possible side effects. Call your doctor for medical advice about side effects. You may report side effects to FDA at1-800-FDA-1088. Where should I keep my medication? This drug is given in a hospital or clinic and will not be stored at home. NOTE: This sheet is a summary. It may not cover all possible information. If you have questions about this medicine, talk to your doctor, pharmacist,  orhealth care provider.  2022 Elsevier/Gold Standard (2008-04-28 16:50:29)  Fluorouracil, 5-FU injection What is this medication? FLUOROURACIL, 5-FU (flure oh YOOR a sil) is a chemotherapy drug. It slows the growth of cancer cells. This medicine is used to treat many types of cancer like breast cancer, colon or rectal cancer, pancreatic cancer, and stomachcancer. This medicine may be used for other purposes; ask your health care provider orpharmacist if you have questions. COMMON BRAND NAME(S): Adrucil What should I tell my care team before I take this medication? They need to know if you have any of these conditions: blood disorders dihydropyrimidine dehydrogenase (DPD) deficiency infection (especially a virus infection such as chickenpox, cold sores, or herpes) kidney disease liver disease malnourished, poor nutrition recent or ongoing radiation therapy an unusual or allergic reaction to fluorouracil, other chemotherapy, other medicines, foods, dyes, or preservatives pregnant or trying to get pregnant breast-feeding How should I use this medication? This drug is given as an infusion or injection into a vein. It is administeredin a hospital or clinic by a specially trained health care professional. Talk to your pediatrician regarding the use of  this medicine in children.Special care may be needed. Overdosage: If you think you have taken too much of this medicine contact apoison control center or emergency room at once. NOTE: This medicine is only for you. Do not share this medicine with others. What if I miss a dose? It is important not to miss your dose. Call your doctor or health careprofessional if you are unable to keep an appointment. What may interact with this medication? Do not take this medicine with any of the following medications: live virus vaccines This medicine may also interact with the following medications: medicines that treat or prevent blood clots like warfarin,  enoxaparin, and dalteparin This list may not describe all possible interactions. Give your health care provider a list of all the medicines, herbs, non-prescription drugs, or dietary supplements you use. Also tell them if you smoke, drink alcohol, or use illegaldrugs. Some items may interact with your medicine. What should I watch for while using this medication? Visit your doctor for checks on your progress. This drug may make you feel generally unwell. This is not uncommon, as chemotherapy can affect healthy cells as well as cancer cells. Report any side effects. Continue your course oftreatment even though you feel ill unless your doctor tells you to stop. In some cases, you may be given additional medicines to help with side effects.Follow all directions for their use. Call your doctor or health care professional for advice if you get a fever, chills or sore throat, or other symptoms of a cold or flu. Do not treat yourself. This drug decreases your body's ability to fight infections. Try toavoid being around people who are sick. This medicine may increase your risk to bruise or bleed. Call your doctor orhealth care professional if you notice any unusual bleeding. Be careful brushing and flossing your teeth or using a toothpick because you may get an infection or bleed more easily. If you have any dental work done,tell your dentist you are receiving this medicine. Avoid taking products that contain aspirin, acetaminophen, ibuprofen, naproxen, or ketoprofen unless instructed by your doctor. These medicines may hide afever. Do not become pregnant while taking this medicine. Women should inform their doctor if they wish to become pregnant or think they might be pregnant. There is a potential for serious side effects to an unborn child. Talk to your health care professional or pharmacist for more information. Do not breast-feed aninfant while taking this medicine. Men should inform their doctor if they wish  to father a child. This medicinemay lower sperm counts. Do not treat diarrhea with over the counter products. Contact your doctor ifyou have diarrhea that lasts more than 2 days or if it is severe and watery. This medicine can make you more sensitive to the sun. Keep out of the sun. If you cannot avoid being in the sun, wear protective clothing and use sunscreen.Do not use sun lamps or tanning beds/booths. What side effects may I notice from receiving this medication? Side effects that you should report to your doctor or health care professionalas soon as possible: allergic reactions like skin rash, itching or hives, swelling of the face, lips, or tongue low blood counts - this medicine may decrease the number of white blood cells, red blood cells and platelets. You may be at increased risk for infections and bleeding. signs of infection - fever or chills, cough, sore throat, pain or difficulty passing urine signs of decreased platelets or bleeding - bruising, pinpoint red spots on the skin, black,  tarry stools, blood in the urine signs of decreased red blood cells - unusually weak or tired, fainting spells, lightheadedness breathing problems changes in vision chest pain mouth sores nausea and vomiting pain, swelling, redness at site where injected pain, tingling, numbness in the hands or feet redness, swelling, or sores on hands or feet stomach pain unusual bleeding Side effects that usually do not require medical attention (report to yourdoctor or health care professional if they continue or are bothersome): changes in finger or toe nails diarrhea dry or itchy skin hair loss headache loss of appetite sensitivity of eyes to the light stomach upset unusually teary eyes This list may not describe all possible side effects. Call your doctor for medical advice about side effects. You may report side effects to FDA at1-800-FDA-1088. Where should I keep my medication? This drug is given in  a hospital or clinic and will not be stored at home. NOTE: This sheet is a summary. It may not cover all possible information. If you have questions about this medicine, talk to your doctor, pharmacist, orhealth care provider.  2022 Elsevier/Gold Standard (2019-09-23 15:00:03)

## 2021-06-14 NOTE — Patient Instructions (Signed)
Implanted Port Home Guide An implanted port is a device that is placed under the skin. It is usually placed in the chest. The device can be used to give IV medicine, to take blood, or for dialysis. You may have an implanted port if: You need IV medicine that would be irritating to the small veins in your hands or arms. You need IV medicines, such as antibiotics, for a long period of time. You need IV nutrition for a long period of time. You need dialysis. When you have a port, your health care provider can choose to use the port instead of veins in your arms for these procedures. You may have fewer limitations when using a port than you would if you used other types of long-term IVs, and you will likely be able to return to normal activities afteryour incision heals. An implanted port has two main parts: Reservoir. The reservoir is the part where a needle is inserted to give medicines or draw blood. The reservoir is round. After it is placed, it appears as a small, raised area under your skin. Catheter. The catheter is a thin, flexible tube that connects the reservoir to a vein. Medicine that is inserted into the reservoir goes into the catheter and then into the vein. How is my port accessed? To access your port: A numbing cream may be placed on the skin over the port site. Your health care provider will put on a mask and sterile gloves. The skin over your port will be cleaned carefully with a germ-killing soap and allowed to dry. Your health care provider will gently pinch the port and insert a needle into it. Your health care provider will check for a blood return to make sure the port is in the vein and is not clogged. If your port needs to remain accessed to get medicine continuously (constant infusion), your health care provider will place a clear bandage (dressing) over the needle site. The dressing and needle will need to be changed every week, or as told by your health care provider. What  is flushing? Flushing helps keep the port from getting clogged. Follow instructions from your health care provider about how and when to flush the port. Ports are usually flushed with saline solution or a medicine called heparin. The need for flushing will depend on how the port is used: If the port is only used from time to time to give medicines or draw blood, the port may need to be flushed: Before and after medicines have been given. Before and after blood has been drawn. As part of routine maintenance. Flushing may be recommended every 4-6 weeks. If a constant infusion is running, the port may not need to be flushed. Throw away any syringes in a disposal container that is meant for sharp items (sharps container). You can buy a sharps container from a pharmacy, or you can make one by using an empty hard plastic bottle with a cover. How long will my port stay implanted? The port can stay in for as long as your health care provider thinks it is needed. When it is time for the port to come out, a surgery will be done to remove it. The surgery will be similar to the procedure that was done to putthe port in. Follow these instructions at home:  Flush your port as told by your health care provider. If you need an infusion over several days, follow instructions from your health care provider about how to take   care of your port site. Make sure you: Wash your hands with soap and water before you change your dressing. If soap and water are not available, use alcohol-based hand sanitizer. Change your dressing as told by your health care provider. Place any used dressings or infusion bags into a plastic bag. Throw that bag in the trash. Keep the dressing that covers the needle clean and dry. Do not get it wet. Do not use scissors or sharp objects near the tube. Keep the tube clamped, unless it is being used. Check your port site every day for signs of infection. Check for: Redness, swelling, or  pain. Fluid or blood. Pus or a bad smell. Protect the skin around the port site. Avoid wearing bra straps that rub or irritate the site. Protect the skin around your port from seat belts. Place a soft pad over your chest if needed. Bathe or shower as told by your health care provider. The site may get wet as long as you are not actively receiving an infusion. Return to your normal activities as told by your health care provider. Ask your health care provider what activities are safe for you. Carry a medical alert card or wear a medical alert bracelet at all times. This will let health care providers know that you have an implanted port in case of an emergency. Get help right away if: You have redness, swelling, or pain at the port site. You have fluid or blood coming from your port site. You have pus or a bad smell coming from the port site. You have a fever. Summary Implanted ports are usually placed in the chest for long-term IV access. Follow instructions from your health care provider about flushing the port and changing bandages (dressings). Take care of the area around your port by avoiding clothing that puts pressure on the area, and by watching for signs of infection. Protect the skin around your port from seat belts. Place a soft pad over your chest if needed. Get help right away if you have a fever or you have redness, swelling, pain, drainage, or a bad smell at the port site. This information is not intended to replace advice given to you by your health care provider. Make sure you discuss any questions you have with your healthcare provider. Document Revised: 03/08/2020 Document Reviewed: 03/08/2020 Elsevier Patient Education  2022 Elsevier Inc.  

## 2021-06-14 NOTE — Progress Notes (Signed)
[facility] OFFICE PROGRESS NOTE   Diagnosis: Rectal cancer  INTERVAL HISTORY:   Mr. Talmage returns as scheduled.  He completed cycle 6 FOLFOX 05/31/2021.  During the Oxaliplatin he developed coughing, sneezing and throat clearing.  He became diaphoretic.  Oxaliplatin was placed on hold.  Vital signs remained stable.  He had no wheezing or shortness of breath.  Throat did not feel tight.  There was no rash.  He was given Benadryl 25 mg IV with subsequent resolution of symptoms.  Unclear if this represented acute laryngo- pharyngeal dysesthesia versus an allergic reaction.  He was given Pepcid 20 mg IV and the Oxaliplatin was resumed at a slower rate.  He was able to complete the infusion with no further symptoms.  He developed a temperature of 100.5 degrees the night of treatment.  The following day temperature remained 99 to 99.5 degrees.  No other symptoms.  No significant nausea/vomiting.  No mouth sores.  No diarrhea.  Cold sensitivity lasted about 1 week.  No numbness or tingling today.  Objective:  Vital signs in last 24 hours:  Blood pressure 135/81, pulse 100, temperature 98.1 F (36.7 C), temperature source Oral, resp. rate 20, height '5\' 7"'$  (1.702 m), weight 245 lb 12.8 oz (111.5 kg), SpO2 96 %.    HEENT: No thrush or ulcers. Resp: Lungs clear bilaterally. Cardio: Regular rate and rhythm. GI: Abdomen soft and nontender.  No hepatomegaly. Vascular: No leg edema. Neuro: Vibratory sense intact to mildly decreased over the fingertips per tuning fork exam. Skin: Palms without erythema. Port-A-Cath without erythema.   Lab Results:  Lab Results  Component Value Date   WBC 5.9 05/31/2021   HGB 14.0 05/31/2021   HCT 43.2 05/31/2021   MCV 88.7 05/31/2021   PLT 171 05/31/2021   NEUTROABS 2.8 05/31/2021    Imaging:  No results found.  Medications: I have reviewed the patient's current medications.  Assessment/Plan: Rectal cancer Colonoscopy 02/15/2021-  nonobstructing mass at 2-3 cm in the anal verge, removed in a piecemeal fashion, pathology confirmed moderately differentiated adenocarcinoma CTs chest, abdomen, and pelvis on 02/17/2021- no evidence of metastatic disease, no clear mass identified, areas of mild rectal wall thickening MRI 03/02/2021- rectal tumor not identified, no extension beyond the muscularis identified.  N1 disease with multiple left mesorectal nodes, and a 7 mm node beyond the mesorectum at the hypogastric region Cycle 1 FOLFOX 03/22/2021 Cycle 2 FOLFOX 04/05/2021 Cycle 3 FOLFOX 04/19/2021 Cycle 4 FOLFOX 05/03/2021 Cycle 5 FOLFOX 05/17/2021 Cycle 6 FOLFOX 05/31/2021 Cycle 7 FOLFOX 06/14/2021-Oxaliplatin infusion time lengthened, Benadryl and Pepcid added to premedications due to coughing, sneezing, throat clearing, diaphoresis during Oxaliplatin cycle 6   History of dysphagia-status post a barium swallow 10/22/2020- mild smooth stricture at the GE junction Psoriatic arthritis Diabetes COVID-19 January 22 Coughing, sneezing, throat clearing, diaphoresis during Oxaliplatin infusion cycle 6 FOLFOX  Disposition: Mr. Prasse appears stable.  He has completed 6 cycles of FOLFOX.  Plan to proceed with cycle 7 today as scheduled.  Due to coughing, sneezing, throat clearing and diaphoresis during Oxaliplatin with cycle 6 premedications have been adjusted to include Benadryl and Pepcid.  The oxaliplatin infusion time will be lengthened.  Mr. Gantert understands symptoms could have been an allergic reaction versus acute laryngo- pharyngeal dysesthesia and could happen again.  He agrees to proceed.  We reviewed the CBC from today.  Counts adequate to proceed with treatment.  He will return for lab, follow-up, cycle 8 FOLFOX in 2 weeks.  He  will contact the office in the interim with any problems.  Plan reviewed with Dr. Benay Spice.    Ned Card ANP/GNP-BC   06/14/2021  8:16 AM

## 2021-06-16 ENCOUNTER — Inpatient Hospital Stay: Payer: BC Managed Care – PPO

## 2021-06-16 ENCOUNTER — Telehealth: Payer: Self-pay

## 2021-06-16 ENCOUNTER — Other Ambulatory Visit: Payer: Self-pay

## 2021-06-16 VITALS — BP 140/80 | HR 101 | Temp 98.0°F | Resp 18

## 2021-06-16 DIAGNOSIS — R61 Generalized hyperhidrosis: Secondary | ICD-10-CM | POA: Diagnosis not present

## 2021-06-16 DIAGNOSIS — C20 Malignant neoplasm of rectum: Secondary | ICD-10-CM | POA: Diagnosis not present

## 2021-06-16 DIAGNOSIS — Z5111 Encounter for antineoplastic chemotherapy: Secondary | ICD-10-CM | POA: Diagnosis not present

## 2021-06-16 DIAGNOSIS — R059 Cough, unspecified: Secondary | ICD-10-CM | POA: Diagnosis not present

## 2021-06-16 DIAGNOSIS — Z8616 Personal history of COVID-19: Secondary | ICD-10-CM | POA: Diagnosis not present

## 2021-06-16 DIAGNOSIS — Z8719 Personal history of other diseases of the digestive system: Secondary | ICD-10-CM | POA: Diagnosis not present

## 2021-06-16 DIAGNOSIS — L4050 Arthropathic psoriasis, unspecified: Secondary | ICD-10-CM | POA: Diagnosis not present

## 2021-06-16 DIAGNOSIS — Z79899 Other long term (current) drug therapy: Secondary | ICD-10-CM | POA: Diagnosis not present

## 2021-06-16 DIAGNOSIS — E119 Type 2 diabetes mellitus without complications: Secondary | ICD-10-CM | POA: Diagnosis not present

## 2021-06-16 DIAGNOSIS — R067 Sneezing: Secondary | ICD-10-CM | POA: Diagnosis not present

## 2021-06-16 MED ORDER — SODIUM CHLORIDE 0.9% FLUSH
10.0000 mL | INTRAVENOUS | Status: DC | PRN
  Administered 2021-06-16: 10 mL
  Filled 2021-06-16: qty 10

## 2021-06-16 MED ORDER — HEPARIN SOD (PORK) LOCK FLUSH 100 UNIT/ML IV SOLN
500.0000 [IU] | Freq: Once | INTRAVENOUS | Status: AC | PRN
  Administered 2021-06-16: 500 [IU]
  Filled 2021-06-16: qty 5

## 2021-06-16 NOTE — Patient Instructions (Signed)
Implanted Port Home Guide An implanted port is a device that is placed under the skin. It is usually placed in the chest. The device can be used to give IV medicine, to take blood, or for dialysis. You may have an implanted port if: You need IV medicine that would be irritating to the small veins in your hands or arms. You need IV medicines, such as antibiotics, for a long period of time. You need IV nutrition for a long period of time. You need dialysis. When you have a port, your health care provider can choose to use the port instead of veins in your arms for these procedures. You may have fewer limitations when using a port than you would if you used other types of long-term IVs, and you will likely be able to return to normal activities afteryour incision heals. An implanted port has two main parts: Reservoir. The reservoir is the part where a needle is inserted to give medicines or draw blood. The reservoir is round. After it is placed, it appears as a small, raised area under your skin. Catheter. The catheter is a thin, flexible tube that connects the reservoir to a vein. Medicine that is inserted into the reservoir goes into the catheter and then into the vein. How is my port accessed? To access your port: A numbing cream may be placed on the skin over the port site. Your health care provider will put on a mask and sterile gloves. The skin over your port will be cleaned carefully with a germ-killing soap and allowed to dry. Your health care provider will gently pinch the port and insert a needle into it. Your health care provider will check for a blood return to make sure the port is in the vein and is not clogged. If your port needs to remain accessed to get medicine continuously (constant infusion), your health care provider will place a clear bandage (dressing) over the needle site. The dressing and needle will need to be changed every week, or as told by your health care provider. What  is flushing? Flushing helps keep the port from getting clogged. Follow instructions from your health care provider about how and when to flush the port. Ports are usually flushed with saline solution or a medicine called heparin. The need for flushing will depend on how the port is used: If the port is only used from time to time to give medicines or draw blood, the port may need to be flushed: Before and after medicines have been given. Before and after blood has been drawn. As part of routine maintenance. Flushing may be recommended every 4-6 weeks. If a constant infusion is running, the port may not need to be flushed. Throw away any syringes in a disposal container that is meant for sharp items (sharps container). You can buy a sharps container from a pharmacy, or you can make one by using an empty hard plastic bottle with a cover. How long will my port stay implanted? The port can stay in for as long as your health care provider thinks it is needed. When it is time for the port to come out, a surgery will be done to remove it. The surgery will be similar to the procedure that was done to putthe port in. Follow these instructions at home:  Flush your port as told by your health care provider. If you need an infusion over several days, follow instructions from your health care provider about how to take   care of your port site. Make sure you: Wash your hands with soap and water before you change your dressing. If soap and water are not available, use alcohol-based hand sanitizer. Change your dressing as told by your health care provider. Place any used dressings or infusion bags into a plastic bag. Throw that bag in the trash. Keep the dressing that covers the needle clean and dry. Do not get it wet. Do not use scissors or sharp objects near the tube. Keep the tube clamped, unless it is being used. Check your port site every day for signs of infection. Check for: Redness, swelling, or  pain. Fluid or blood. Pus or a bad smell. Protect the skin around the port site. Avoid wearing bra straps that rub or irritate the site. Protect the skin around your port from seat belts. Place a soft pad over your chest if needed. Bathe or shower as told by your health care provider. The site may get wet as long as you are not actively receiving an infusion. Return to your normal activities as told by your health care provider. Ask your health care provider what activities are safe for you. Carry a medical alert card or wear a medical alert bracelet at all times. This will let health care providers know that you have an implanted port in case of an emergency. Get help right away if: You have redness, swelling, or pain at the port site. You have fluid or blood coming from your port site. You have pus or a bad smell coming from the port site. You have a fever. Summary Implanted ports are usually placed in the chest for long-term IV access. Follow instructions from your health care provider about flushing the port and changing bandages (dressings). Take care of the area around your port by avoiding clothing that puts pressure on the area, and by watching for signs of infection. Protect the skin around your port from seat belts. Place a soft pad over your chest if needed. Get help right away if you have a fever or you have redness, swelling, pain, drainage, or a bad smell at the port site. This information is not intended to replace advice given to you by your health care provider. Make sure you discuss any questions you have with your healthcare provider. Document Revised: 03/08/2020 Document Reviewed: 03/08/2020 Elsevier Patient Education  2022 Elsevier Inc.  

## 2021-06-16 NOTE — Telephone Encounter (Signed)
Patient in for 5FU infusion pump d/c.  Patient stated on Tuesday night (06/14/21) he had a temp of 99.5.  Patient denied any other symptoms. Patient stated fever was gone by Wednesday.  Patient wanted to make you aware of symptoms.

## 2021-06-17 ENCOUNTER — Encounter: Payer: Self-pay | Admitting: Radiation Oncology

## 2021-06-17 NOTE — Progress Notes (Signed)
Patient states he is doing well. Patient denies pain, nausea, vomiting, diarrhea, rectal bleeding, and no skin irritation. Reduced appetite with weight maintained.  Meaningful use questions complete and patient notified of his 10:30am telephone appointment on 06/21/21 and expressed understanding of it.

## 2021-06-21 ENCOUNTER — Ambulatory Visit
Admission: RE | Admit: 2021-06-21 | Discharge: 2021-06-21 | Disposition: A | Discharge status: Home / Self Care | Payer: BC Managed Care – PPO | Source: Ambulatory Visit | Attending: Radiation Oncology | Admitting: Radiation Oncology

## 2021-06-21 ENCOUNTER — Ambulatory Visit: Payer: BC Managed Care – PPO | Admitting: Radiation Oncology

## 2021-06-21 DIAGNOSIS — C20 Malignant neoplasm of rectum: Secondary | ICD-10-CM

## 2021-06-21 NOTE — Progress Notes (Signed)
Radiation Oncology         [phone] ________________________________  Outpatient Follow Up - Conducted via telephone due to current COVID-19 concerns for limiting patient exposure  I spoke with the patient to conduct this consult visit via telephone to spare the patient unnecessary potential exposure in the healthcare setting during the current COVID-19 pandemic. The patient was notified in advance and was offered a WebEX meeting to allow for face to face communication but unfortunately reported that they did not have the appropriate resources/technology to support such a visit and instead preferred to proceed with a telephone visit.  Name: Eric Lewis        MRN: [id number]  Date of Service: 06/21/2021 DOB: September 29, 1971  RI:OERYSXD, Dibas, MD  Ladene Artist, MD     REFERRING PHYSICIAN: Ladene Artist, MD   DIAGNOSIS: The encounter diagnosis was Adenocarcinoma of rectum [facility]).   HISTORY OF PRESENT ILLNESS: Eric Lewis is a 50 y.o. male with a history of rectal cancer.  The patient had experienced bright red blood per rectum for several weeks a few months ago.  A colonoscopy on 02/15/2021 was performed by and revealed several polyps that were tubular adenomatous and/or hyperplastic in nature of the ascending and sigmoid colon however there was a hyperplastic polyp in the rectum and an invasive adenocarcinoma in the rectum as well.  CT chest abdomen and pelvis on 02/17/2021 did not show evidence of metastatic disease, there was apparent mild rectal wall thickening.  MRI on 03/02/21 revealed a TxN1 disease. The primary lesion was not easily identified. He began total neoadjuvant chemotherapy on 03/22/21 and completed his last treatment on 06/14/21. He is contacted today to discuss moving forward with chemoRT. He has met with Dr. Marin Olp and plans to have surgery at [facility] following chemoRT.   PREVIOUS RADIATION THERAPY: No   PAST MEDICAL HISTORY:  Past Medical History:  Diagnosis  Date   COVID jan 30 th 2022   fever x 4 days all sx resolved   DM type 2 (diabetes mellitus, type 2) (HCC)    Family history of breast cancer    Family history of prostate cancer    History of hypertension    no meds x 1 year due to weight loss as of 03-16-2021   Hypercholesteremia    Obesity    Psoriatic arthritis (HCC)    Rectal cancer (HCC)    Umbilical hernia        PAST SURGICAL HISTORY: Past Surgical History:  Procedure Laterality Date   CHONDROPLASTY Right 09/28/2014   Procedure: CHONDROPLASTY, PATELLA/FEMORAL;  Surgeon: Harvie Junior, MD;  Location: [facility];  Service: Orthopedics;  Laterality: Right;   colonscopy  03-17-2021   [facility]   KNEE ARTHROSCOPY WITH MEDIAL MENISECTOMY Right 09/28/2014   Procedure: KNEE ARTHROSCOPY WITH MEDIAL MENISECTOMY;  Surgeon: Harvie Junior, MD;  Location: [facility];  Service: Orthopedics;  Laterality: Right;   PORTACATH PLACEMENT Right 03/18/2021   Procedure: PLACEMENT PORT-A-CATH;  Surgeon: Andria Meuse, MD;  Location: [facility];  Service: General;  Laterality: Right;   SYNOVECTOMY Right 09/28/2014   Procedure: SYNOVECTOMY, THREE COMPARTMENT;  Surgeon: Harvie Junior, MD;  Location: [facility];  Service: Orthopedics;  Laterality: Right;     FAMILY HISTORY:  Family History  Problem Relation Age of Onset   Emphysema Maternal Grandmother    Breast cancer Paternal Grandmother        dx 16s  Prostate cancer Paternal Uncle 23     SOCIAL HISTORY:  reports that he has never smoked. He has never used smokeless tobacco. He reports that he does not drink alcohol and does not use drugs. The patient is single and lives in [place]. 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 He is active in his church.   ALLERGIES: Lisinopril and Oxaliplatin   MEDICATIONS:  Current Outpatient Medications  Medication Sig Dispense Refill   FARXIGA 5 MG TABS tablet Take 5 mg by  mouth at bedtime.     loratadine (CLARITIN) 10 MG tablet Take 10 mg by mouth daily.     metFORMIN (GLUCOPHAGE) 500 MG tablet Take 2 tablets by mouth 2 (two) times daily.     OTEZLA 30 MG TABS Take 1 tablet by mouth 2 (two) times daily.     simvastatin (ZOCOR) 40 MG tablet Take 40 mg by mouth at bedtime.     Continuous Blood Gluc Receiver (FREESTYLE LIBRE 14 DAY READER) DEVI See admin instructions. (Patient not taking: No sig reported)     Continuous Blood Gluc Sensor (FREESTYLE LIBRE 14 DAY SENSOR) MISC USE AS DIRECTED EVERY 14 DAYS (Patient not taking: No sig reported)     ondansetron (ZOFRAN) 8 MG tablet Take 1 tablet (8 mg total) by mouth every 8 (eight) hours as needed for nausea or vomiting. Begin 72 hours after IV chemo treatment (Patient not taking: No sig reported) 30 tablet 1   prochlorperazine (COMPAZINE) 10 MG tablet Take 1 tablet (10 mg total) by mouth every 6 (six) hours as needed. (Patient not taking: No sig reported) 60 tablet 1   No current facility-administered medications for this encounter.     REVIEW OF SYSTEMS: On review of systems, the patient reports that he is doing well. He feels that he has been tired from chemotherapy. He has not seen blood per rectum during chemotherapy, and reports he has had loose stools from his medication for psoriatic arthritis and diabetes. He denies any pelvic pain. He has been able to work from home throughout treatment, and is ready to proceed with completing his neoadjuvant treatments.   PHYSICAL EXAM:  Unable to assess due to encounter type.   ECOG = 0  0 - Asymptomatic (Fully active, able to carry on all predisease activities without restriction)  1 - Symptomatic but completely ambulatory (Restricted in physically strenuous activity but ambulatory and able to carry out work of a light or sedentary nature. For example, light housework, office work)  2 - Symptomatic, <50% in bed during the day (Ambulatory and capable of all self care  but unable to carry out any work activities. Up and about more than 50% of waking hours)  3 - Symptomatic, >50% in bed, but not bedbound (Capable of only limited self-care, confined to bed or chair 50% or more of waking hours)  4 - Bedbound (Completely disabled. Cannot carry on any self-care. Totally confined to bed or chair)  5 - Death   Eustace Pen MM, Creech RH, Tormey DC, et al. [phone]). "Toxicity and response criteria of the [facility]". [place] Oncol. 5 (6): 649-55    LABORATORY DATA:  Lab Results  Component Value Date   WBC 6.6 06/14/2021   HGB 13.6 06/14/2021   HCT 43.0 06/14/2021   MCV 88.8 06/14/2021   PLT 155 06/14/2021   Lab Results  Component Value Date   NA 138 06/14/2021   K 4.3 06/14/2021   CL  102 06/14/2021   CO2 27 06/14/2021   Lab Results  Component Value Date   ALT 27 06/14/2021   AST 19 06/14/2021   ALKPHOS 85 06/14/2021   BILITOT 0.5 06/14/2021      RADIOGRAPHY: No results found.      IMPRESSION/PLAN: 1. At least Stage IIIA, cTxN1 Adenocarcinoma of the Rectum. Dr. Lisbeth Renshaw discusses the patient's course since our last visit. He is doing well since and has one more cycle of  chemotherapy on 06/28/21. After this, he will be ready to proceed with the process of coordinating chemoRT. We reviewed the course and discussed the risks, benefits, short, and long term effects of radiotherapy, as well as the curative intent, and the patient is interested in proceeding. Dr. Lisbeth Renshaw discusses the delivery and logistics of radiotherapy and anticipates a course of 5 1/2 weeks of radiotherapy. He will come in for simulation on 07/04/21 and begin treatment on 07/12/21. I'll copy Dr. Ronita Hipps at [facility] as well so he knows the treatment dates. We expect to finish XRT approximately 08/18/21.     Given current concerns for patient exposure during the COVID-19 pandemic, this encounter was conducted via telephone.  The patient has provided two factor  identification and has given verbal consent for this type of encounter and has been advised to only accept a meeting of this type in a secure network environment. The time spent during this encounter was 45 minutes including preparation, discussion, and coordination of the patient's care. The attendants for this meeting include  Dr. Lisbeth Renshaw, Hayden Pedro  and Gregery Na.  During the encounter,   Dr. Lisbeth Renshaw, and Hayden Pedro were located at [facility] Radiation Oncology Department.  Eric Lewis was located at home.    The above documentation reflects my direct findings during this shared patient visit. Please see the separate note by Dr. Lisbeth Renshaw on this date for the remainder of the patient's plan of care.    Carola Rhine, [facility]   **Disclaimer: This note was dictated with voice recognition software. Similar sounding words can inadvertently be transcribed and this note may contain transcription errors which may not have been corrected upon publication of note.**

## 2021-06-22 DIAGNOSIS — C20 Malignant neoplasm of rectum: Secondary | ICD-10-CM | POA: Diagnosis not present

## 2021-06-26 ENCOUNTER — Other Ambulatory Visit: Payer: Self-pay | Admitting: Oncology

## 2021-06-28 ENCOUNTER — Other Ambulatory Visit (HOSPITAL_COMMUNITY): Payer: Self-pay

## 2021-06-28 ENCOUNTER — Inpatient Hospital Stay: Payer: BC Managed Care – PPO

## 2021-06-28 ENCOUNTER — Inpatient Hospital Stay (HOSPITAL_BASED_OUTPATIENT_CLINIC_OR_DEPARTMENT_OTHER): Payer: BC Managed Care – PPO | Admitting: Oncology

## 2021-06-28 ENCOUNTER — Other Ambulatory Visit: Payer: Self-pay

## 2021-06-28 VITALS — BP 138/95 | HR 91 | Temp 98.2°F | Resp 19 | Ht 67.0 in | Wt 243.2 lb

## 2021-06-28 VITALS — BP 127/78 | HR 96 | Temp 98.4°F | Resp 18

## 2021-06-28 DIAGNOSIS — L4050 Arthropathic psoriasis, unspecified: Secondary | ICD-10-CM | POA: Diagnosis not present

## 2021-06-28 DIAGNOSIS — Z8719 Personal history of other diseases of the digestive system: Secondary | ICD-10-CM | POA: Diagnosis not present

## 2021-06-28 DIAGNOSIS — E119 Type 2 diabetes mellitus without complications: Secondary | ICD-10-CM | POA: Diagnosis not present

## 2021-06-28 DIAGNOSIS — Z79899 Other long term (current) drug therapy: Secondary | ICD-10-CM | POA: Diagnosis not present

## 2021-06-28 DIAGNOSIS — C20 Malignant neoplasm of rectum: Secondary | ICD-10-CM

## 2021-06-28 DIAGNOSIS — R059 Cough, unspecified: Secondary | ICD-10-CM | POA: Diagnosis not present

## 2021-06-28 DIAGNOSIS — Z8616 Personal history of COVID-19: Secondary | ICD-10-CM | POA: Diagnosis not present

## 2021-06-28 DIAGNOSIS — R61 Generalized hyperhidrosis: Secondary | ICD-10-CM | POA: Diagnosis not present

## 2021-06-28 DIAGNOSIS — R067 Sneezing: Secondary | ICD-10-CM | POA: Diagnosis not present

## 2021-06-28 DIAGNOSIS — Z5111 Encounter for antineoplastic chemotherapy: Secondary | ICD-10-CM | POA: Diagnosis not present

## 2021-06-28 LAB — CMP (CANCER CENTER ONLY)
ALT: 28 U/L (ref 0–44)
AST: 20 U/L (ref 15–41)
Albumin: 3.7 g/dL (ref 3.5–5.0)
Alkaline Phosphatase: 85 U/L (ref 38–126)
Anion gap: 9 (ref 5–15)
BUN: 13 mg/dL (ref 6–20)
CO2: 25 mmol/L (ref 22–32)
Calcium: 8.9 mg/dL (ref 8.9–10.3)
Chloride: 103 mmol/L (ref 98–111)
Creatinine: 0.83 mg/dL (ref 0.61–1.24)
GFR, Estimated: >60 mL/min (ref >60)
Glucose, Bld: 137 mg/dL — ABNORMAL HIGH (ref 70–99)
Potassium: 4.1 mmol/L (ref 3.5–5.1)
Sodium: 137 mmol/L (ref 135–145)
Total Bilirubin: 0.5 mg/dL (ref 0.3–1.2)
Total Protein: 6.9 g/dL (ref 6.5–8.1)

## 2021-06-28 LAB — CBC WITH DIFFERENTIAL (CANCER CENTER ONLY)
Abs Immature Granulocytes: 0.03 10*3/uL (ref 0.00–0.07)
Basophils Absolute: 0.1 10*3/uL (ref 0.0–0.1)
Basophils Relative: 1 %
Eosinophils Absolute: 0.4 10*3/uL (ref 0.0–0.5)
Eosinophils Relative: 6 %
HCT: 42.7 % (ref 39.0–52.0)
Hemoglobin: 13.6 g/dL (ref 13.0–17.0)
Immature Granulocytes: 0 %
Lymphocytes Relative: 29 %
Lymphs Abs: 2 10*3/uL (ref 0.7–4.0)
MCH: 28.7 pg (ref 26.0–34.0)
MCHC: 31.9 g/dL (ref 30.0–36.0)
MCV: 90.1 fL (ref 80.0–100.0)
Monocytes Absolute: 1.1 10*3/uL — ABNORMAL HIGH (ref 0.1–1.0)
Monocytes Relative: 16 %
Neutro Abs: 3.3 10*3/uL (ref 1.7–7.7)
Neutrophils Relative %: 48 %
Platelet Count: 146 10*3/uL — ABNORMAL LOW (ref 150–400)
RBC: 4.74 MIL/uL (ref 4.22–5.81)
RDW: 16.8 % — ABNORMAL HIGH (ref 11.5–15.5)
WBC Count: 6.8 10*3/uL (ref 4.0–10.5)
nRBC: 0 % (ref 0.0–0.2)

## 2021-06-28 MED ORDER — METHYLPREDNISOLONE SODIUM SUCC 125 MG IJ SOLR
125.0000 mg | Freq: Once | INTRAMUSCULAR | Status: AC
  Administered 2021-06-28: 125 mg via INTRAVENOUS

## 2021-06-28 MED ORDER — DIPHENHYDRAMINE HCL 50 MG/ML IJ SOLN
25.0000 mg | Freq: Once | INTRAMUSCULAR | Status: AC
  Administered 2021-06-28: 25 mg via INTRAVENOUS

## 2021-06-28 MED ORDER — PALONOSETRON HCL INJECTION 0.25 MG/5ML
0.2500 mg | Freq: Once | INTRAVENOUS | Status: AC
  Administered 2021-06-28: 0.25 mg via INTRAVENOUS
  Filled 2021-06-28: qty 5

## 2021-06-28 MED ORDER — CAPECITABINE 500 MG PO TABS
ORAL_TABLET | ORAL | 0 refills | Status: DC
  Filled 2021-06-28: qty 196, fill #0

## 2021-06-28 MED ORDER — OXALIPLATIN CHEMO INJECTION 100 MG/20ML
85.0000 mg/m2 | Freq: Once | INTRAVENOUS | Status: AC
  Administered 2021-06-28: 200 mg via INTRAVENOUS
  Filled 2021-06-28: qty 40

## 2021-06-28 MED ORDER — DEXTROSE 5 % IV SOLN
Freq: Once | INTRAVENOUS | Status: AC
Start: 2021-06-28 — End: 2021-06-28

## 2021-06-28 MED ORDER — LEUCOVORIN CALCIUM INJECTION 350 MG
400.0000 mg/m2 | Freq: Once | INTRAVENOUS | Status: AC
  Administered 2021-06-28: 932 mg via INTRAVENOUS
  Filled 2021-06-28: qty 46.6

## 2021-06-28 MED ORDER — FAMOTIDINE 20 MG IN NS 100 ML IVPB
20.0000 mg | Freq: Once | INTRAVENOUS | Status: AC
  Administered 2021-06-28: 20 mg via INTRAVENOUS
  Filled 2021-06-28: qty 100

## 2021-06-28 MED ORDER — SODIUM CHLORIDE 0.9 % IV SOLN
10.0000 mg | Freq: Once | INTRAVENOUS | Status: AC
  Administered 2021-06-28: 10 mg via INTRAVENOUS
  Filled 2021-06-28: qty 1

## 2021-06-28 MED ORDER — DIPHENHYDRAMINE HCL 50 MG/ML IJ SOLN
25.0000 mg | Freq: Once | INTRAMUSCULAR | Status: AC
  Administered 2021-06-28: 25 mg via INTRAVENOUS
  Filled 2021-06-28: qty 1

## 2021-06-28 MED ORDER — FLUOROURACIL CHEMO INJECTION 2.5 GM/50ML
400.0000 mg/m2 | Freq: Once | INTRAVENOUS | Status: AC
  Administered 2021-06-28: 950 mg via INTRAVENOUS
  Filled 2021-06-28: qty 19

## 2021-06-28 MED ORDER — SODIUM CHLORIDE 0.9 % IV SOLN
2400.0000 mg/m2 | INTRAVENOUS | Status: DC
  Administered 2021-06-28: 5600 mg via INTRAVENOUS
  Filled 2021-06-28: qty 112

## 2021-06-28 MED ORDER — SODIUM CHLORIDE 0.9 % IV SOLN: Freq: Once | INTRAVENOUS | Status: DC | PRN

## 2021-06-28 NOTE — Addendum Note (Signed)
Addended by: Betsy Coder B on: 06/28/2021 04:49 PM   Modules accepted: Orders

## 2021-06-28 NOTE — Progress Notes (Signed)
Hypersensitivity Reaction note  Date of event: 06/28/21.  Time of event: 1215.  Generic name of drug involved: Oxaliplatin (ELOXATIN) + Leucovorin.  Name of provider notified of the hypersensitivity reaction: Ned Card, NP & Betsy Coder, MD.  Was agent that likely caused hypersensitivity reaction added to Allergies List within EMR? Yes.  Chain of events including reaction signs/symptoms, treatment administered, and outcome:  1200: Started Oxaliplatin/Leucovorin infusion. [id number]: Patient verbalized "I don't feel too good", patient red to the face and neck, diaphoretic, coughing, nauseas. Infusion stopped immediately, NS 0.9% Litre bag IVF started at 999 ml/hr, vitals obtained. 1216: Ned Card, NP at chairside, vitals obtained, per Lattie Haw, drug discontinued. 1218: Solumedrol 125 mg injection administered.  1220: Benadryl 25 mg injection administered, 2L Oxygen via Nasal canula administered, vitals obtained. 1222: Oxygen increased to 4 L [place], Vitals obtained. 1247: IVF decreased to 500 ml/hr 1256: Oxygen decreased to 1 L 1305: Patient complained on shivering, patient noted to be visibly shivering, warm blanket applied.  1310 Temp obtained 97.6 oral, IVF rate reduced to 250 ml/hr. 1340: Vitals obtained, redness to face and neck cleared, swelling in hands gone, rash on back cleared. Patient verbalized feeling much better. 1415: IVF completed, vitals obtained, patient verbalized he is ready to go home. All vitals obtained documented in flowsheet. 1425: 5FU Pump connected, patient denied any complaints or concerns, discharged in stable condition with vitals back to base line.  Georgianne Fick, RN 06/28/2021 1:50 PM

## 2021-06-28 NOTE — Patient Instructions (Signed)
Eric Lewis   Discharge Instructions: Thank you for choosing [facility] to provide your oncology and hematology care.   If you have a lab appointment with the [facility], please go directly to the [facility] and check in at the registration area.   Wear comfortable clothing and clothing appropriate for easy access to any Portacath or PICC line.   We strive to give you quality time with your provider. You may need to reschedule your appointment if you arrive late (15 or more minutes).  Arriving late affects you and other patients whose appointments are after yours.  Also, if you miss three or more appointments without notifying the office, you may be dismissed from the clinic at the provider's discretion.      For prescription refill requests, have your pharmacy contact our office and allow 72 hours for refills to be completed.    Today you received the following chemotherapy and/or immunotherapy agents Oxaliplatin (ELOXATIN), Leucovorin & Flourouracil (ADRUCIL).      To help prevent nausea and vomiting after your treatment, we encourage you to take your nausea medication as directed.  BELOW ARE SYMPTOMS THAT SHOULD BE REPORTED IMMEDIATELY: *FEVER GREATER THAN 100.4 F (38 C) OR HIGHER *CHILLS OR SWEATING *NAUSEA AND VOMITING THAT IS NOT CONTROLLED WITH YOUR NAUSEA MEDICATION *UNUSUAL SHORTNESS OF BREATH *UNUSUAL BRUISING OR BLEEDING *URINARY PROBLEMS (pain or burning when urinating, or frequent urination) *BOWEL PROBLEMS (unusual diarrhea, constipation, pain near the anus) TENDERNESS IN MOUTH AND THROAT WITH OR WITHOUT PRESENCE OF ULCERS (sore throat, sores in mouth, or a toothache) UNUSUAL RASH, SWELLING OR PAIN  UNUSUAL VAGINAL DISCHARGE OR ITCHING   Items with * indicate a potential emergency and should be followed up as soon as possible or go to the Emergency Department if any problems should occur.  Please show the CHEMOTHERAPY ALERT  CARD or IMMUNOTHERAPY ALERT CARD at check-in to the Emergency Department and triage nurse.  Should you have questions after your visit or need to cancel or reschedule your appointment, please contact [facility]  Dept: [phone]  and follow the prompts.  Office hours are 8:00 a.m. to 4:30 p.m. Monday - Friday. Please note that voicemails left after 4:00 p.m. may not be returned until the following business day.  We are closed weekends and major holidays. You have access to a nurse at all times for urgent questions. Please call the main number to the clinic Dept: [phone] and follow the prompts.   For any non-urgent questions, you may also contact your provider using MyChart. We now offer e-Visits for anyone 50 and older to request care online for non-urgent symptoms. For details visit [email or web address].   Also download the MyChart app! Go to the app store, search "MyChart", open the app, select [facility], and log in with your MyChart username and password.  Due to Covid, a mask is required upon entering the hospital/clinic. If you do not have a mask, one will be given to you upon arrival. For doctor visits, patients may have 1 support person aged 68 or older with them. For treatment visits, patients cannot have anyone with them due to current Covid guidelines and our immunocompromised population.   Oxaliplatin Injection What is this medication? OXALIPLATIN (ox AL i PLA tin) is a chemotherapy drug. It targets fast dividing cells, like cancer cells, and causes these cells to die. This medicine is usedto treat cancers of the colon and rectum, and many  other cancers. This medicine may be used for other purposes; ask your health care provider orpharmacist if you have questions. COMMON BRAND NAME(S): Eloxatin What should I tell my care team before I take this medication? They need to know if you have any of these conditions: heart disease history of irregular  heartbeat liver disease low blood counts, like white cells, platelets, or red blood cells lung or breathing disease, like asthma take medicines that treat or prevent blood clots tingling of the fingers or toes, or other nerve disorder an unusual or allergic reaction to oxaliplatin, other chemotherapy, other medicines, foods, dyes, or preservatives pregnant or trying to get pregnant breast-feeding How should I use this medication? This drug is given as an infusion into a vein. It is administered in a hospitalor clinic by a specially trained health care professional. Talk to your pediatrician regarding the use of this medicine in children.Special care may be needed. Overdosage: If you think you have taken too much of this medicine contact apoison control center or emergency room at once. NOTE: This medicine is only for you. Do not share this medicine with others. What if I miss a dose? It is important not to miss a dose. Call your doctor or health careprofessional if you are unable to keep an appointment. What may interact with this medication? Do not take this medicine with any of the following medications: cisapride dronedarone pimozide thioridazine This medicine may also interact with the following medications: aspirin and aspirin-like medicines certain medicines that treat or prevent blood clots like warfarin, apixaban, dabigatran, and rivaroxaban cisplatin cyclosporine diuretics medicines for infection like acyclovir, adefovir, amphotericin B, bacitracin, cidofovir, foscarnet, ganciclovir, gentamicin, pentamidine, vancomycin NSAIDs, medicines for pain and inflammation, like ibuprofen or naproxen other medicines that prolong the QT interval (an abnormal heart rhythm) pamidronate zoledronic acid This list may not describe all possible interactions. Give your health care provider a list of all the medicines, herbs, non-prescription drugs, or dietary supplements you use. Also tell  them if you smoke, drink alcohol, or use illegaldrugs. Some items may interact with your medicine. What should I watch for while using this medication? Your condition will be monitored carefully while you are receiving thismedicine. You may need blood work done while you are taking this medicine. This medicine may make you feel generally unwell. This is not uncommon as chemotherapy can affect healthy cells as well as cancer cells. Report any side effects. Continue your course of treatment even though you feel ill unless yourhealthcare professional tells you to stop. This medicine can make you more sensitive to cold. Do not drink cold drinks or use ice. Cover exposed skin before coming in contact with cold temperatures or cold objects. When out in cold weather wear warm clothing and cover your mouth and nose to warm the air that goes into your lungs. Tell your doctor if you getsensitive to the cold. Do not become pregnant while taking this medicine or for 9 months after stopping it. Women should inform their health care professional if they wish to become pregnant or think they might be pregnant. Men should not father a child while taking this medicine and for 6 months after stopping it. There is potential for serious side effects to an unborn child. Talk to your health careprofessional for more information. Do not breast-feed a child while taking this medicine or for 3 months afterstopping it. This medicine has caused ovarian failure in some women. This medicine may make it more difficult to get pregnant.  Talk to your health care professional if [place] concerned about your fertility. This medicine has caused decreased sperm counts in some men. This may make it more difficult to father a child. Talk to your health care professional if [place] concerned about your fertility. This medicine may increase your risk of getting an infection. Call your health care professional for advice if you get a fever, chills,  or sore throat, or other symptoms of a cold or flu. Do not treat yourself. Try to avoid beingaround people who are sick. Avoid taking medicines that contain aspirin, acetaminophen, ibuprofen, naproxen, or ketoprofen unless instructed by your health care professional.These medicines may hide a fever. Be careful brushing or flossing your teeth or using a toothpick because you may get an infection or bleed more easily. If you have any dental work done, Primary school teacher you are receiving this medicine. What side effects may I notice from receiving this medication? Side effects that you should report to your doctor or health care professionalas soon as possible: allergic reactions like skin rash, itching or hives, swelling of the face, lips, or tongue breathing problems cough low blood counts - this medicine may decrease the number of white blood cells, red blood cells, and platelets. You may be at increased risk for infections and bleeding nausea, vomiting pain, redness, or irritation at site where injected pain, tingling, numbness in the hands or feet signs and symptoms of bleeding such as bloody or black, tarry stools; red or dark brown urine; spitting up blood or brown material that looks like coffee grounds; red spots on the skin; unusual bruising or bleeding from the eyes, gums, or nose signs and symptoms of a dangerous change in heartbeat or heart rhythm like chest pain; dizziness; fast, irregular heartbeat; palpitations; feeling faint or lightheaded; falls signs and symptoms of infection like fever; chills; cough; sore throat; pain or trouble passing urine signs and symptoms of liver injury like dark yellow or brown urine; general ill feeling or flu-like symptoms; light-colored stools; loss of appetite; nausea; right upper belly pain; unusually weak or tired; yellowing of the eyes or skin signs and symptoms of low red blood cells or anemia such as unusually weak or tired; feeling faint or  lightheaded; falls signs and symptoms of muscle injury like dark urine; trouble passing urine or change in the amount of urine; unusually weak or tired; muscle pain; back pain Side effects that usually do not require medical attention (report to yourdoctor or health care professional if they continue or are bothersome): changes in taste diarrhea gas hair loss loss of appetite mouth sores This list may not describe all possible side effects. Call your doctor for medical advice about side effects. You may report side effects to FDA at1-800-FDA-1088. Where should I keep my medication? This drug is given in a hospital or clinic and will not be stored at home. NOTE: This sheet is a summary. It may not cover all possible information. If you have questions about this medicine, talk to your doctor, pharmacist, orhealth care provider.  2022 Elsevier/Gold Standard (2019-03-12 12:20:35)  Leucovorin injection What is this medication? LEUCOVORIN (loo koe VOR in) is used to prevent or treat the harmful effects of some medicines. This medicine is used to treat anemia caused by a low amount of folic acid in the body. It is also used with 5-fluorouracil (5-FU) to treatcolon cancer. This medicine may be used for other purposes; ask your health care provider orpharmacist if you have questions. What should I  tell my care team before I take this medication? They need to know if you have any of these conditions: anemia from low levels of vitamin B-12 in the blood an unusual or allergic reaction to leucovorin, folic acid, other medicines, foods, dyes, or preservatives pregnant or trying to get pregnant breast-feeding How should I use this medication? This medicine is for injection into a muscle or into a vein. It is given by ahealth care professional in a hospital or clinic setting. Talk to your pediatrician regarding the use of this medicine in children.Special care may be needed. Overdosage: If you think you  have taken too much of this medicine contact apoison control center or emergency room at once. NOTE: This medicine is only for you. Do not share this medicine with others. What if I miss a dose? This does not apply. What may interact with this medication? capecitabine fluorouracil phenobarbital phenytoin primidone trimethoprim-sulfamethoxazole This list may not describe all possible interactions. Give your health care provider a list of all the medicines, herbs, non-prescription drugs, or dietary supplements you use. Also tell them if you smoke, drink alcohol, or use illegaldrugs. Some items may interact with your medicine. What should I watch for while using this medication? Your condition will be monitored carefully while you are receiving thismedicine. This medicine may increase the side effects of 5-fluorouracil, 5-FU. Tell your doctor or health care professional if you have diarrhea or mouth sores that donot get better or that get worse. What side effects may I notice from receiving this medication? Side effects that you should report to your doctor or health care professionalas soon as possible: allergic reactions like skin rash, itching or hives, swelling of the face, lips, or tongue breathing problems fever, infection mouth sores unusual bleeding or bruising unusually weak or tired Side effects that usually do not require medical attention (report to yourdoctor or health care professional if they continue or are bothersome): constipation or diarrhea loss of appetite nausea, vomiting This list may not describe all possible side effects. Call your doctor for medical advice about side effects. You may report side effects to FDA at1-800-FDA-1088. Where should I keep my medication? This drug is given in a hospital or clinic and will not be stored at home. NOTE: This sheet is a summary. It may not cover all possible information. If you have questions about this medicine, talk to your  doctor, pharmacist, orhealth care provider.  2022 Elsevier/Gold Standard (2008-04-28 16:50:29)  Fluorouracil, 5-FU injection What is this medication? FLUOROURACIL, 5-FU (flure oh YOOR a sil) is a chemotherapy drug. It slows the growth of cancer cells. This medicine is used to treat many types of cancer like breast cancer, colon or rectal cancer, pancreatic cancer, and stomachcancer. This medicine may be used for other purposes; ask your health care provider orpharmacist if you have questions. COMMON BRAND NAME(S): Adrucil What should I tell my care team before I take this medication? They need to know if you have any of these conditions: blood disorders dihydropyrimidine dehydrogenase (DPD) deficiency infection (especially a virus infection such as chickenpox, cold sores, or herpes) kidney disease liver disease malnourished, poor nutrition recent or ongoing radiation therapy an unusual or allergic reaction to fluorouracil, other chemotherapy, other medicines, foods, dyes, or preservatives pregnant or trying to get pregnant breast-feeding How should I use this medication? This drug is given as an infusion or injection into a vein. It is administeredin a hospital or clinic by a specially trained health care professional. Talk to  your pediatrician regarding the use of this medicine in children.Special care may be needed. Overdosage: If you think you have taken too much of this medicine contact apoison control center or emergency room at once. NOTE: This medicine is only for you. Do not share this medicine with others. What if I miss a dose? It is important not to miss your dose. Call your doctor or health careprofessional if you are unable to keep an appointment. What may interact with this medication? Do not take this medicine with any of the following medications: live virus vaccines This medicine may also interact with the following medications: medicines that treat or prevent blood  clots like warfarin, enoxaparin, and dalteparin This list may not describe all possible interactions. Give your health care provider a list of all the medicines, herbs, non-prescription drugs, or dietary supplements you use. Also tell them if you smoke, drink alcohol, or use illegaldrugs. Some items may interact with your medicine. What should I watch for while using this medication? Visit your doctor for checks on your progress. This drug may make you feel generally unwell. This is not uncommon, as chemotherapy can affect healthy cells as well as cancer cells. Report any side effects. Continue your course oftreatment even though you feel ill unless your doctor tells you to stop. In some cases, you may be given additional medicines to help with side effects.Follow all directions for their use. Call your doctor or health care professional for advice if you get a fever, chills or sore throat, or other symptoms of a cold or flu. Do not treat yourself. This drug decreases your body's ability to fight infections. Try toavoid being around people who are sick. This medicine may increase your risk to bruise or bleed. Call your doctor orhealth care professional if you notice any unusual bleeding. Be careful brushing and flossing your teeth or using a toothpick because you may get an infection or bleed more easily. If you have any dental work done,tell your dentist you are receiving this medicine. Avoid taking products that contain aspirin, acetaminophen, ibuprofen, naproxen, or ketoprofen unless instructed by your doctor. These medicines may hide afever. Do not become pregnant while taking this medicine. Women should inform their doctor if they wish to become pregnant or think they might be pregnant. There is a potential for serious side effects to an unborn child. Talk to your health care professional or pharmacist for more information. Do not breast-feed aninfant while taking this medicine. Men should inform  their doctor if they wish to father a child. This medicinemay lower sperm counts. Do not treat diarrhea with over the counter products. Contact your doctor ifyou have diarrhea that lasts more than 2 days or if it is severe and watery. This medicine can make you more sensitive to the sun. Keep out of the sun. If you cannot avoid being in the sun, wear protective clothing and use sunscreen.Do not use sun lamps or tanning beds/booths. What side effects may I notice from receiving this medication? Side effects that you should report to your doctor or health care professionalas soon as possible: allergic reactions like skin rash, itching or hives, swelling of the face, lips, or tongue low blood counts - this medicine may decrease the number of white blood cells, red blood cells and platelets. You may be at increased risk for infections and bleeding. signs of infection - fever or chills, cough, sore throat, pain or difficulty passing urine signs of decreased platelets or bleeding - bruising, pinpoint  red spots on the skin, black, tarry stools, blood in the urine signs of decreased red blood cells - unusually weak or tired, fainting spells, lightheadedness breathing problems changes in vision chest pain mouth sores nausea and vomiting pain, swelling, redness at site where injected pain, tingling, numbness in the hands or feet redness, swelling, or sores on hands or feet stomach pain unusual bleeding Side effects that usually do not require medical attention (report to yourdoctor or health care professional if they continue or are bothersome): changes in finger or toe nails diarrhea dry or itchy skin hair loss headache loss of appetite sensitivity of eyes to the light stomach upset unusually teary eyes This list may not describe all possible side effects. Call your doctor for medical advice about side effects. You may report side effects to FDA at1-800-FDA-1088. Where should I keep my  medication? This drug is given in a hospital or clinic and will not be stored at home. NOTE: This sheet is a summary. It may not cover all possible information. If you have questions about this medicine, talk to your doctor, pharmacist, orhealth care provider.  2022 Elsevier/Gold Standard (2019-09-23 15:00:03)  The chemotherapy medication bag should finish at 46 hours, 96 hours, or 7 days. For example, if your pump is scheduled for 46 hours and it was put on at 4:00 p.m., it should finish at 2:00 p.m. the day it is scheduled to come off regardless of your appointment time.     Estimated time to finish at 12:30 p.m. on Thursday 06/30/2021.   If the display on your pump reads "Low Volume" and it is beeping, take the batteries out of the pump and come to the cancer center for it to be taken off.   If the pump alarms go off prior to the pump reading "Low Volume" then call [phone] and someone can assist you.  If the plunger comes out and the chemotherapy medication is leaking out, please use your home chemo spill kit to clean up the spill. Do NOT use paper towels or other household products.  If you have problems or questions regarding your pump, please call either [phone] (24 hours a day) or the cancer center Monday-Friday 8:00 a.m.- 4:30 p.m. at the clinic number and we will assist you. If you are unable to get assistance, then go to the nearest Emergency Department and ask the staff to contact the IV team for assistance.

## 2021-06-28 NOTE — Progress Notes (Addendum)
[facility] OFFICE PROGRESS NOTE   Diagnosis: Rectal cancer  INTERVAL HISTORY:   [name] completed another cycle of FOLFOX on 06/14/2021.  No acute reaction during the oxaliplatin infusion.  He had a fever of 99 degrees on the evening of day 1.  The fever spontaneously resolved.  He had cold sensitivity for 1 week following chemotherapy.  No other neuropathy symptoms.  No neuropathy symptoms at present.  Good appetite.  He is working.  He reports 1 episode of rectal bleeding. No nausea or vomiting.  Objective:  Vital signs in last 24 hours:  Blood pressure (!) 138/95, pulse 91, temperature 98.2 F (36.8 C), temperature source Oral, resp. rate 19, height '5\' 7"'$  (1.702 m), weight 243 lb 3.2 oz (110.3 kg), SpO2 100 %.    HEENT: No thrush or ulcers Resp: Lungs clear bilaterally Cardio: Regular rate and rhythm GI: No hepatosplenomegaly, nontender, no mass Vascular: No leg edema Neuro: Very mild to mild loss of vibratory sense at the fingertips bilaterally    Portacath/PICC-without erythema  Lab Results:  Lab Results  Component Value Date   WBC 6.8 06/28/2021   HGB 13.6 06/28/2021   HCT 42.7 06/28/2021   MCV 90.1 06/28/2021   PLT 146 (L) 06/28/2021   NEUTROABS 3.3 06/28/2021    CMP  Lab Results  Component Value Date   NA 138 06/14/2021   K 4.3 06/14/2021   CL 102 06/14/2021   CO2 27 06/14/2021   GLUCOSE 150 (H) 06/14/2021   BUN 12 06/14/2021   CREATININE 0.88 06/14/2021   CALCIUM 8.8 (L) 06/14/2021   PROT 6.6 06/14/2021   ALBUMIN 3.9 06/14/2021   AST 19 06/14/2021   ALT 27 06/14/2021   ALKPHOS 85 06/14/2021   BILITOT 0.5 06/14/2021   GFRNONAA >60 06/14/2021   GFRAA >60 05/30/2016    Lab Results  Component Value Date   CEA 4.12 03/22/2021   viewed the patient's current medications.   Assessment/Plan:  Rectal cancer Colonoscopy 02/15/2021- nonobstructing mass at 2-3 cm in the anal verge, removed in a piecemeal fashion, pathology confirmed  moderately differentiated adenocarcinoma CTs chest, abdomen, and pelvis on 02/17/2021- no evidence of metastatic disease, no clear mass identified, areas of mild rectal wall thickening MRI 03/02/2021- rectal tumor not identified, no extension beyond the muscularis identified.  N1 disease with multiple left mesorectal nodes, and a 7 mm node beyond the mesorectum at the hypogastric region Cycle 1 FOLFOX 03/22/2021 Cycle 2 FOLFOX 04/05/2021 Cycle 3 FOLFOX 04/19/2021 Cycle 4 FOLFOX 05/03/2021 Cycle 5 FOLFOX 05/17/2021 Cycle 6 FOLFOX 05/31/2021 Cycle 7 FOLFOX 06/14/2021-Oxaliplatin infusion time lengthened, Benadryl and Pepcid added to premedications due to coughing, sneezing, throat clearing, diaphoresis during Oxaliplatin cycle 6 Cycle 8 FOLFOX 06/28/2021   History of dysphagia-status post a barium swallow 10/22/2020- mild smooth stricture at the GE junction Psoriatic arthritis Diabetes COVID-19 January 22 Coughing, sneezing, throat clearing, diaphoresis during Oxaliplatin infusion cycle 6 FOLFOX   Disposition: Mr Eric Lewis appears stable.  He continues to tolerate the chemotherapy well.  He does not have significant neuropathy symptoms.  He will complete cycle 8 FOLFOX today.  The plan is to begin concurrent capecitabine and radiation on 07/12/2021.  We reviewed potential toxicities associated with capecitabine including the chance of mucositis, diarrhea, and hematologic toxicity.  We discussed the sun sensitivity, hyperpigmentation, and hand/foot syndrome associated with capecitabine.  He agrees to proceed.  He will take capecitabine on days of radiation only.  [name] will be seen for an office  visit prior to beginning capecitabine on 07/12/2021.  Betsy Coder, MD  06/28/2021  9:21 AM  Addendum 12:38 PM-I was called to the infusion area to evaluate [name] for a possible reaction.  Nurse reports about 10 minutes into the Oxaliplatin infusion he developed a cough and diaphoresis.  He was  hypotensive with systolic blood pressure in the 70s, oxygen saturation upper 80s.  Oxaliplatin infusion stopped, normal saline initiated.  He denied shortness of breath, chest pain, throat tightness.  On exam he was in no acute distress.  Obvious diaphoresis.  Faint bilateral wheezes.  No respiratory distress.  Regular rate and rhythm.  He received Solu-Medrol 125 mg IV and Benadryl 25 mg IV.  2 L of oxygen, subsequently increased to 4 L.  Blood pressure and oxygen saturation levels improved.  Oxygen decreased to 2 L with saturation maintaining 94 to 96%.  IV fluids decreased to [id number] cc/h with systolic blood pressure approximately 119.  He developed erythematous/hive-like rash on the trunk, otherwise stable.  We are characterizing this reaction as anaphylactic.  No further Oxaliplatin.  At 1:15 PM rash fading, vital signs stable.  Oxygen decreased to 1 L, IV fluids decreased to 250 cc/h with blood pressure and oxygen saturation maintaining.  Plan to continue observing.  If he remains stable, oxygen and IV fluids will be discontinued, proceed with remainder of treatment plan.

## 2021-06-29 ENCOUNTER — Encounter: Payer: Self-pay | Admitting: *Deleted

## 2021-06-29 ENCOUNTER — Telehealth: Payer: Self-pay | Admitting: Pharmacist

## 2021-06-29 ENCOUNTER — Other Ambulatory Visit (HOSPITAL_COMMUNITY): Payer: Self-pay

## 2021-06-29 ENCOUNTER — Telehealth: Payer: Self-pay | Admitting: Pharmacy Technician

## 2021-06-29 DIAGNOSIS — C20 Malignant neoplasm of rectum: Secondary | ICD-10-CM

## 2021-06-29 MED ORDER — CAPECITABINE 500 MG PO TABS
ORAL_TABLET | ORAL | 0 refills | Status: DC
  Filled 2021-06-29: qty 196, fill #0
  Filled 2021-07-01: qty 140, 28d supply, fill #0
  Filled 2021-07-26: qty 56, 8d supply, fill #1

## 2021-06-29 NOTE — Telephone Encounter (Signed)
Oral Oncology Patient Advocate Encounter   Received notification from [facility] that prior authorization for Xeloda is required.   PA submitted on CoverMyMeds Key [id number] Status is pending   Oral Oncology Clinic will continue to follow.  [facility] Patient [place] Phone [phone] Fax [phone] 06/29/2021 2:10 PM

## 2021-06-29 NOTE — Telephone Encounter (Signed)
Oral Oncology Pharmacist Encounter  Received new prescription for Xeloda (capecitabine) for the treatment of rectal cancer in conjunction with XRT, s/p FOLFOX. Planned duration until the end of XRT. Planned start 07/12/21.  CMP from 06/28/21 assessed, no relevant lab abnormalities. Prescription dose and frequency assessed.   Current medication list in Epic reviewed, no relevant DDIs with capecitabine identified.  Evaluated chart and no patient barriers to medication adherence identified.   Prescription has been e-scribed to the [facility] for benefits analysis and approval.  Oral Oncology Clinic will continue to follow for insurance authorization, copayment issues, initial counseling and start date.  Patient agreed to treatment on 06/28/21 per MD documentation.  Darl Pikes, PharmD, BCPS, BCOP, CPP Hematology/Oncology Clinical Pharmacist Practitioner [facility] [phone]  06/29/2021 2:38 PM

## 2021-06-30 ENCOUNTER — Encounter: Payer: Self-pay | Admitting: Oncology

## 2021-06-30 ENCOUNTER — Other Ambulatory Visit: Payer: Self-pay

## 2021-06-30 ENCOUNTER — Inpatient Hospital Stay: Payer: BC Managed Care – PPO

## 2021-06-30 ENCOUNTER — Other Ambulatory Visit (HOSPITAL_COMMUNITY): Payer: Self-pay

## 2021-06-30 VITALS — BP 130/85 | HR 95 | Temp 98.1°F | Resp 20

## 2021-06-30 DIAGNOSIS — Z8616 Personal history of COVID-19: Secondary | ICD-10-CM | POA: Diagnosis not present

## 2021-06-30 DIAGNOSIS — C20 Malignant neoplasm of rectum: Secondary | ICD-10-CM

## 2021-06-30 DIAGNOSIS — R61 Generalized hyperhidrosis: Secondary | ICD-10-CM | POA: Diagnosis not present

## 2021-06-30 DIAGNOSIS — E119 Type 2 diabetes mellitus without complications: Secondary | ICD-10-CM | POA: Diagnosis not present

## 2021-06-30 DIAGNOSIS — Z79899 Other long term (current) drug therapy: Secondary | ICD-10-CM | POA: Diagnosis not present

## 2021-06-30 DIAGNOSIS — Z8719 Personal history of other diseases of the digestive system: Secondary | ICD-10-CM | POA: Diagnosis not present

## 2021-06-30 DIAGNOSIS — R059 Cough, unspecified: Secondary | ICD-10-CM | POA: Diagnosis not present

## 2021-06-30 DIAGNOSIS — L4050 Arthropathic psoriasis, unspecified: Secondary | ICD-10-CM | POA: Diagnosis not present

## 2021-06-30 DIAGNOSIS — R067 Sneezing: Secondary | ICD-10-CM | POA: Diagnosis not present

## 2021-06-30 DIAGNOSIS — Z5111 Encounter for antineoplastic chemotherapy: Secondary | ICD-10-CM | POA: Diagnosis not present

## 2021-06-30 MED ORDER — HEPARIN SOD (PORK) LOCK FLUSH 100 UNIT/ML IV SOLN
500.0000 [IU] | Freq: Once | INTRAVENOUS | Status: AC | PRN
  Administered 2021-06-30: 500 [IU]

## 2021-06-30 MED ORDER — SODIUM CHLORIDE 0.9% FLUSH
10.0000 mL | INTRAVENOUS | Status: DC | PRN
  Administered 2021-06-30: 10 mL

## 2021-06-30 NOTE — Telephone Encounter (Signed)
Oral Oncology Patient Advocate Encounter  Prior Authorization for Xeloda has been approved.    PA# [id number] Effective dates: 06/29/21 through 06/28/22  Patients co-pay is $0.00.  Oral Oncology Clinic will continue to follow.   [place] Patient [place] Phone [phone] Fax [phone] 06/30/2021 9:40 AM

## 2021-07-01 ENCOUNTER — Other Ambulatory Visit (HOSPITAL_COMMUNITY): Payer: Self-pay

## 2021-07-01 NOTE — Telephone Encounter (Signed)
Oral Chemotherapy Pharmacist Encounter  [facility] will deliver medication on Tuesday 07/05/21 to [name]. He know to start the Xeloda with his first day of radiation on 07/12/21.  Patient Education I spoke with patient for overview of new oral chemotherapy medication: Xeloda (capecitabine) for the treatment of rectal cancer in conjunction with XRT, s/p FOLFOX. Planned duration until the end of XRT. Planned start 07/12/21.   Pt is doing well. Counseled patient on administration, dosing, side effects, monitoring, drug-food interactions, safe handling, storage, and disposal. Patient will take 4 tablets ('2000mg'$ ) by mouth in AM and 3 tablets ('1500mg'$ ) in PM. Take with food. Take Monday-Friday. Take only on days of radiation.  Side effects include but not limited to: diarrhea, hand-foot syndrome, mouth sores, edema, decreased wbc, fatigue, N/V. Diarrhea: discussed the use to loperamide for diarrhea. With his previous treatment, he only had a little bit of diarrhea toward the end. He mentioned that his Eric Lewis can cause him to have diarrhea sometimes. Hand-foot syndrome: recommended the use of [place] Smooth Extra Care 20 several times a day and whenever his hands or feet are dry. He knows to knows to look out for redness, dry or peeling skin, and blisters Mouth sores: He will call for magic mouth wash if he developes mouth sores N/V: he has prescription for antiemesis medications to use as needed     Reviewed with patient importance of keeping a medication schedule and plan for any missed doses.  After discussion with patient no patient barriers to medication adherence identified.   Eric Lewis voiced understanding and appreciation. All questions answered. Medication handout provided.  Provided patient with [facility] phone number. Patient knows to call the office with questions or concerns. Oral Chemotherapy Navigation Clinic will continue to follow.  Darl Pikes, PharmD, BCPS, BCOP, CPP Hematology/Oncology Clinical Pharmacist Practitioner [facility] [phone]  07/01/2021 1:47 PM

## 2021-07-04 ENCOUNTER — Other Ambulatory Visit (HOSPITAL_COMMUNITY): Payer: Self-pay

## 2021-07-04 ENCOUNTER — Other Ambulatory Visit: Payer: Self-pay

## 2021-07-04 ENCOUNTER — Ambulatory Visit
Admission: RE | Admit: 2021-07-04 | Discharge: 2021-07-04 | Disposition: A | Discharge status: Home / Self Care | Payer: BC Managed Care – PPO | Source: Ambulatory Visit | Attending: Radiation Oncology | Admitting: Radiation Oncology

## 2021-07-04 DIAGNOSIS — C20 Malignant neoplasm of rectum: Secondary | ICD-10-CM | POA: Insufficient documentation

## 2021-07-04 NOTE — Telephone Encounter (Signed)
Oral Oncology Patient Advocate Encounter  I spoke with Mr Holder on 07/01/21 to set up delivery of Xeloda.  Address verified for shipment.  Xeloda will be filled through [facility] and mailed 07/04/21 for delivery 07/05/21.    [facility] will call 7-10 days before next refill is due to complete adherence call and set up delivery of medication.     [facility] Patient [place] Phone [phone] Fax [phone] 07/04/2021 11:27 AM

## 2021-07-06 NOTE — Addendum Note (Signed)
Addended by: Tora Kindred on: 07/06/2021 03:48 PM   Modules accepted: Orders

## 2021-07-08 DIAGNOSIS — C20 Malignant neoplasm of rectum: Secondary | ICD-10-CM | POA: Diagnosis not present

## 2021-07-12 ENCOUNTER — Inpatient Hospital Stay: Payer: BC Managed Care – PPO | Attending: Oncology | Admitting: Nurse Practitioner

## 2021-07-12 ENCOUNTER — Encounter: Payer: Self-pay | Admitting: Nurse Practitioner

## 2021-07-12 ENCOUNTER — Other Ambulatory Visit: Payer: Self-pay

## 2021-07-12 ENCOUNTER — Inpatient Hospital Stay: Payer: BC Managed Care – PPO

## 2021-07-12 ENCOUNTER — Ambulatory Visit
Admission: RE | Admit: 2021-07-12 | Discharge: 2021-07-12 | Disposition: A | Discharge status: Home / Self Care | Payer: BC Managed Care – PPO | Source: Ambulatory Visit | Attending: Radiation Oncology | Admitting: Radiation Oncology

## 2021-07-12 VITALS — BP 140/87 | HR 103 | Temp 98.7°F | Resp 18 | Ht 67.0 in | Wt 247.2 lb

## 2021-07-12 DIAGNOSIS — Z452 Encounter for adjustment and management of vascular access device: Secondary | ICD-10-CM | POA: Diagnosis not present

## 2021-07-12 DIAGNOSIS — C20 Malignant neoplasm of rectum: Secondary | ICD-10-CM | POA: Insufficient documentation

## 2021-07-12 DIAGNOSIS — E119 Type 2 diabetes mellitus without complications: Secondary | ICD-10-CM | POA: Diagnosis not present

## 2021-07-12 DIAGNOSIS — Z8616 Personal history of COVID-19: Secondary | ICD-10-CM | POA: Insufficient documentation

## 2021-07-12 LAB — CBC WITH DIFFERENTIAL (CANCER CENTER ONLY)
Abs Immature Granulocytes: 0.04 10*3/uL (ref 0.00–0.07)
Basophils Absolute: 0.1 10*3/uL (ref 0.0–0.1)
Basophils Relative: 1 %
Eosinophils Absolute: 0.2 10*3/uL (ref 0.0–0.5)
Eosinophils Relative: 2 %
HCT: 41.5 % (ref 39.0–52.0)
Hemoglobin: 13.1 g/dL (ref 13.0–17.0)
Immature Granulocytes: 1 %
Lymphocytes Relative: 24 %
Lymphs Abs: 2.1 10*3/uL (ref 0.7–4.0)
MCH: 29 pg (ref 26.0–34.0)
MCHC: 31.6 g/dL (ref 30.0–36.0)
MCV: 91.8 fL (ref 80.0–100.0)
Monocytes Absolute: 1 10*3/uL (ref 0.1–1.0)
Monocytes Relative: 11 %
Neutro Abs: 5.3 10*3/uL (ref 1.7–7.7)
Neutrophils Relative %: 61 %
Platelet Count: 169 10*3/uL (ref 150–400)
RBC: 4.52 MIL/uL (ref 4.22–5.81)
RDW: 16.5 % — ABNORMAL HIGH (ref 11.5–15.5)
WBC Count: 8.6 10*3/uL (ref 4.0–10.5)
nRBC: 0 % (ref 0.0–0.2)

## 2021-07-12 LAB — CMP (CANCER CENTER ONLY)
ALT: 24 U/L (ref 0–44)
AST: 19 U/L (ref 15–41)
Albumin: 3.7 g/dL (ref 3.5–5.0)
Alkaline Phosphatase: 72 U/L (ref 38–126)
Anion gap: 7 (ref 5–15)
BUN: 12 mg/dL (ref 6–20)
CO2: 26 mmol/L (ref 22–32)
Calcium: 8.7 mg/dL — ABNORMAL LOW (ref 8.9–10.3)
Chloride: 105 mmol/L (ref 98–111)
Creatinine: 0.95 mg/dL (ref 0.61–1.24)
GFR, Estimated: >60 mL/min (ref >60)
Glucose, Bld: 161 mg/dL — ABNORMAL HIGH (ref 70–99)
Potassium: 4.4 mmol/L (ref 3.5–5.1)
Sodium: 138 mmol/L (ref 135–145)
Total Bilirubin: 0.5 mg/dL (ref 0.3–1.2)
Total Protein: 6.6 g/dL (ref 6.5–8.1)

## 2021-07-12 MED ORDER — SODIUM CHLORIDE 0.9% FLUSH: 10.0000 mL | Freq: Once | INTRAVENOUS | Status: AC

## 2021-07-12 MED ORDER — HEPARIN SOD (PORK) LOCK FLUSH 100 UNIT/ML IV SOLN: 500.0000 [IU] | Freq: Once | INTRAVENOUS | Status: AC

## 2021-07-12 NOTE — Patient Instructions (Signed)
Implanted Port Home Guide An implanted port is a device that is placed under the skin. It is usually placed in the chest. The device can be used to give IV medicine, to take blood, or for dialysis. You may have an implanted port if: You need IV medicine that would be irritating to the small veins in your hands or arms. You need IV medicines, such as antibiotics, for a long period of time. You need IV nutrition for a long period of time. You need dialysis. When you have a port, your health care provider can choose to use the port instead of veins in your arms for these procedures. You may have fewer limitations when using a port than you would if you used other types of long-term IVs, and you will likely be able to return to normal activities after your incision heals. An implanted port has two main parts: Reservoir. The reservoir is the part where a needle is inserted to give medicines or draw blood. The reservoir is round. After it is placed, it appears as a small, raised area under your skin. Catheter. The catheter is a thin, flexible tube that connects the reservoir to a vein. Medicine that is inserted into the reservoir goes into the catheter and then into the vein. How is my port accessed? To access your port: A numbing cream may be placed on the skin over the port site. Your health care provider will put on a mask and sterile gloves. The skin over your port will be cleaned carefully with a germ-killing soap and allowed to dry. Your health care provider will gently pinch the port and insert a needle into it. Your health care provider will check for a blood return to make sure the port is in the vein and is not clogged. If your port needs to remain accessed to get medicine continuously (constant infusion), your health care provider will place a clear bandage (dressing) over the needle site. The dressing and needle will need to be changed every week, or as told by your health care provider. What  is flushing? Flushing helps keep the port from getting clogged. Follow instructions from your health care provider about how and when to flush the port. Ports are usually flushed with saline solution or a medicine called heparin. The need for flushing will depend on how the port is used: If the port is only used from time to time to give medicines or draw blood, the port may need to be flushed: Before and after medicines have been given. Before and after blood has been drawn. As part of routine maintenance. Flushing may be recommended every 4-6 weeks. If a constant infusion is running, the port may not need to be flushed. Throw away any syringes in a disposal container that is meant for sharp items (sharps container). You can buy a sharps container from a pharmacy, or you can make one by using an empty hard plastic bottle with a cover. How long will my port stay implanted? The port can stay in for as long as your health care provider thinks it is needed. When it is time for the port to come out, a surgery will be done to remove it. The surgery will be similar to the procedure that was done to put the port in. Follow these instructions at home:  Flush your port as told by your health care provider. If you need an infusion over several days, follow instructions from your health care provider about how   to take care of your port site. Make sure you: Wash your hands with soap and water before you change your dressing. If soap and water are not available, use alcohol-based hand sanitizer. Change your dressing as told by your health care provider. Place any used dressings or infusion bags into a plastic bag. Throw that bag in the trash. Keep the dressing that covers the needle clean and dry. Do not get it wet. Do not use scissors or sharp objects near the tube. Keep the tube clamped, unless it is being used. Check your port site every day for signs of infection. Check for: Redness, swelling, or  pain. Fluid or blood. Pus or a bad smell. Protect the skin around the port site. Avoid wearing bra straps that rub or irritate the site. Protect the skin around your port from seat belts. Place a soft pad over your chest if needed. Bathe or shower as told by your health care provider. The site may get wet as long as you are not actively receiving an infusion. Return to your normal activities as told by your health care provider. Ask your health care provider what activities are safe for you. Carry a medical alert card or wear a medical alert bracelet at all times. This will let health care providers know that you have an implanted port in case of an emergency. Get help right away if: You have redness, swelling, or pain at the port site. You have fluid or blood coming from your port site. You have pus or a bad smell coming from the port site. You have a fever. Summary Implanted ports are usually placed in the chest for long-term IV access. Follow instructions from your health care provider about flushing the port and changing bandages (dressings). Take care of the area around your port by avoiding clothing that puts pressure on the area, and by watching for signs of infection. Protect the skin around your port from seat belts. Place a soft pad over your chest if needed. Get help right away if you have a fever or you have redness, swelling, pain, drainage, or a bad smell at the port site. This information is not intended to replace advice given to you by your health care provider. Make sure you discuss any questions you have with your health care provider. Document Revised: 01/12/2021 Document Reviewed: 03/08/2020 Elsevier Patient Education  2022 Elsevier Inc.  

## 2021-07-12 NOTE — Progress Notes (Signed)
  [facility] OFFICE PROGRESS NOTE   Diagnosis: Rectal cancer  INTERVAL HISTORY:   Eric Lewis returns as scheduled.  He completed cycle 8 FOLFOX 06/28/2021.  He had an anaphylactic reaction to oxaliplatin.  He began radiation/Xeloda earlier today.  He denies nausea/vomiting.  No mouth sores.  Occasional loose stool.  No neuropathy symptoms.  He has intermittent pain involving the right arm.  He thinks this may be due to carpal tunnel syndrome.  No pain at present.  The arm does not feel swollen.  Objective:  Vital signs in last 24 hours:  Blood pressure 140/87, pulse (!) 103, temperature 98.7 F (37.1 C), temperature source Oral, resp. rate 18, height '5\' 7"'$  (1.702 m), weight 247 lb 3.2 oz (112.1 kg), SpO2 98 %.    HEENT: No thrush or ulcers. Resp: Lungs clear bilaterally. Cardio: Regular rate and rhythm. GI: Abdomen soft and nontender.  No hepatomegaly. Vascular: No upper extremity or lower extremity edema.  No right neck edema.  No vein engorgement over the chest wall. Skin: Palms without erythema. Port-A-Cath without erythema, nontender.   Lab Results:  Lab Results  Component Value Date   WBC 8.6 07/12/2021   HGB 13.1 07/12/2021   HCT 41.5 07/12/2021   MCV 91.8 07/12/2021   PLT 169 07/12/2021   NEUTROABS 5.3 07/12/2021    Imaging:  No results found.  Medications: I have reviewed the patient's current medications.  Assessment/Plan: Rectal cancer Colonoscopy 02/15/2021- nonobstructing mass at 2-3 cm in the anal verge, removed in a piecemeal fashion, pathology confirmed moderately differentiated adenocarcinoma CTs chest, abdomen, and pelvis on 02/17/2021- no evidence of metastatic disease, no clear mass identified, areas of mild rectal wall thickening MRI 03/02/2021- rectal tumor not identified, no extension beyond the muscularis identified.  N1 disease with multiple left mesorectal nodes, and a 7 mm node beyond the mesorectum at the hypogastric region Cycle  1 FOLFOX 03/22/2021 Cycle 2 FOLFOX 04/05/2021 Cycle 3 FOLFOX 04/19/2021 Cycle 4 FOLFOX 05/03/2021 Cycle 5 FOLFOX 05/17/2021 Cycle 6 FOLFOX 05/31/2021 Cycle 7 FOLFOX 06/14/2021-Oxaliplatin infusion time lengthened, Benadryl and Pepcid added to premedications due to coughing, sneezing, throat clearing, diaphoresis during Oxaliplatin cycle 6 Cycle 8 FOLFOX 06/28/2021 Radiation/Xeloda 07/12/2021   History of dysphagia-status post a barium swallow 10/22/2020- mild smooth stricture at the GE junction Psoriatic arthritis Diabetes COVID-19 January 22 Coughing, sneezing, throat clearing, diaphoresis during Oxaliplatin infusion cycle 6 FOLFOX Anaphylactic reaction to oxaliplatin 06/28/2021  Disposition: Eric Lewis appears stable.  He completed 8 cycles of neoadjuvant FOLFOX.  He had an anaphylactic reaction to the oxaliplatin with cycle 8.  Oxaliplatin was discontinued a few minutes into the infusion.  He was able to complete the remainder of the regimen.  He began radiation/Xeloda earlier today.  We again reviewed potential toxicities associated with Xeloda.  He will return for lab and follow-up in approximately 2 weeks.  We are available to see him sooner if needed.    Ned Card ANP/GNP-BC   07/12/2021  10:30 AM

## 2021-07-13 ENCOUNTER — Ambulatory Visit
Admission: RE | Admit: 2021-07-13 | Discharge: 2021-07-13 | Disposition: A | Discharge status: Home / Self Care | Payer: BC Managed Care – PPO | Source: Ambulatory Visit | Attending: Radiation Oncology | Admitting: Radiation Oncology

## 2021-07-13 DIAGNOSIS — C20 Malignant neoplasm of rectum: Secondary | ICD-10-CM | POA: Diagnosis not present

## 2021-07-14 ENCOUNTER — Other Ambulatory Visit: Payer: Self-pay

## 2021-07-14 ENCOUNTER — Ambulatory Visit
Admission: RE | Admit: 2021-07-14 | Discharge: 2021-07-14 | Disposition: A | Discharge status: Home / Self Care | Payer: BC Managed Care – PPO | Source: Ambulatory Visit | Attending: Radiation Oncology | Admitting: Radiation Oncology

## 2021-07-14 DIAGNOSIS — C20 Malignant neoplasm of rectum: Secondary | ICD-10-CM | POA: Diagnosis not present

## 2021-07-15 ENCOUNTER — Ambulatory Visit
Admission: RE | Admit: 2021-07-15 | Discharge: 2021-07-15 | Disposition: A | Discharge status: Home / Self Care | Payer: BC Managed Care – PPO | Source: Ambulatory Visit | Attending: Radiation Oncology | Admitting: Radiation Oncology

## 2021-07-15 DIAGNOSIS — C20 Malignant neoplasm of rectum: Secondary | ICD-10-CM | POA: Diagnosis not present

## 2021-07-15 NOTE — Progress Notes (Signed)
Pt here for patient teaching.  Pt given Radiation and You booklet and skin care instructions.  Reviewed areas of pertinence such as diarrhea, fatigue, hair loss, nausea and vomiting, sexual and fertility changes, skin changes, and urinary and bladder changes . Pt able to give teach back of to pat skin, use unscented/gentle soap, use baby wipes, have Imodium on hand, drink plenty of water, and sitz bath,avoid applying anything to skin within 4 hours of treatment. Pt verbalizes understanding of information given and will contact nursing with any questions or concerns.     [email or web address]/treatmentinformation/[email or web address]  Gloriajean Dell. Leonie Green, BSN

## 2021-07-18 ENCOUNTER — Other Ambulatory Visit: Payer: Self-pay

## 2021-07-18 ENCOUNTER — Ambulatory Visit
Admission: RE | Admit: 2021-07-18 | Discharge: 2021-07-18 | Disposition: A | Discharge status: Home / Self Care | Payer: BC Managed Care – PPO | Source: Ambulatory Visit | Attending: Radiation Oncology | Admitting: Radiation Oncology

## 2021-07-18 DIAGNOSIS — C20 Malignant neoplasm of rectum: Secondary | ICD-10-CM | POA: Diagnosis not present

## 2021-07-19 ENCOUNTER — Ambulatory Visit
Admission: RE | Admit: 2021-07-19 | Discharge: 2021-07-19 | Disposition: A | Discharge status: Home / Self Care | Payer: BC Managed Care – PPO | Source: Ambulatory Visit | Attending: Radiation Oncology | Admitting: Radiation Oncology

## 2021-07-19 DIAGNOSIS — C20 Malignant neoplasm of rectum: Secondary | ICD-10-CM | POA: Diagnosis not present

## 2021-07-20 ENCOUNTER — Other Ambulatory Visit: Payer: Self-pay

## 2021-07-20 ENCOUNTER — Ambulatory Visit
Admission: RE | Admit: 2021-07-20 | Discharge: 2021-07-20 | Disposition: A | Discharge status: Home / Self Care | Payer: BC Managed Care – PPO | Source: Ambulatory Visit | Attending: Radiation Oncology | Admitting: Radiation Oncology

## 2021-07-20 DIAGNOSIS — C20 Malignant neoplasm of rectum: Secondary | ICD-10-CM | POA: Diagnosis not present

## 2021-07-21 ENCOUNTER — Ambulatory Visit
Admission: RE | Admit: 2021-07-21 | Discharge: 2021-07-21 | Disposition: A | Discharge status: Home / Self Care | Payer: BC Managed Care – PPO | Source: Ambulatory Visit | Attending: Radiation Oncology | Admitting: Radiation Oncology

## 2021-07-21 DIAGNOSIS — C20 Malignant neoplasm of rectum: Secondary | ICD-10-CM | POA: Diagnosis not present

## 2021-07-22 ENCOUNTER — Other Ambulatory Visit: Payer: Self-pay

## 2021-07-22 ENCOUNTER — Ambulatory Visit
Admission: RE | Admit: 2021-07-22 | Discharge: 2021-07-22 | Disposition: A | Discharge status: Home / Self Care | Payer: BC Managed Care – PPO | Source: Ambulatory Visit | Attending: Radiation Oncology | Admitting: Radiation Oncology

## 2021-07-22 DIAGNOSIS — C20 Malignant neoplasm of rectum: Secondary | ICD-10-CM | POA: Diagnosis not present

## 2021-07-25 ENCOUNTER — Other Ambulatory Visit: Payer: Self-pay

## 2021-07-25 ENCOUNTER — Ambulatory Visit
Admission: RE | Admit: 2021-07-25 | Discharge: 2021-07-25 | Disposition: A | Discharge status: Home / Self Care | Payer: BC Managed Care – PPO | Source: Ambulatory Visit | Attending: Radiation Oncology | Admitting: Radiation Oncology

## 2021-07-25 DIAGNOSIS — C20 Malignant neoplasm of rectum: Secondary | ICD-10-CM | POA: Diagnosis not present

## 2021-07-26 ENCOUNTER — Encounter: Payer: Self-pay | Admitting: Nurse Practitioner

## 2021-07-26 ENCOUNTER — Inpatient Hospital Stay (HOSPITAL_BASED_OUTPATIENT_CLINIC_OR_DEPARTMENT_OTHER): Payer: BC Managed Care – PPO | Admitting: Nurse Practitioner

## 2021-07-26 ENCOUNTER — Inpatient Hospital Stay: Payer: BC Managed Care – PPO

## 2021-07-26 ENCOUNTER — Telehealth: Payer: Self-pay | Admitting: *Deleted

## 2021-07-26 ENCOUNTER — Ambulatory Visit
Admission: RE | Admit: 2021-07-26 | Discharge: 2021-07-26 | Disposition: A | Discharge status: Home / Self Care | Payer: BC Managed Care – PPO | Source: Ambulatory Visit | Attending: Radiation Oncology | Admitting: Radiation Oncology

## 2021-07-26 ENCOUNTER — Other Ambulatory Visit (HOSPITAL_COMMUNITY): Payer: Self-pay

## 2021-07-26 VITALS — BP 134/85 | HR 98 | Temp 97.8°F | Resp 20 | Wt 248.6 lb

## 2021-07-26 DIAGNOSIS — C20 Malignant neoplasm of rectum: Secondary | ICD-10-CM | POA: Diagnosis not present

## 2021-07-26 DIAGNOSIS — Z95828 Presence of other vascular implants and grafts: Secondary | ICD-10-CM

## 2021-07-26 DIAGNOSIS — Z452 Encounter for adjustment and management of vascular access device: Secondary | ICD-10-CM | POA: Diagnosis not present

## 2021-07-26 DIAGNOSIS — Z8616 Personal history of COVID-19: Secondary | ICD-10-CM | POA: Diagnosis not present

## 2021-07-26 DIAGNOSIS — E119 Type 2 diabetes mellitus without complications: Secondary | ICD-10-CM | POA: Diagnosis not present

## 2021-07-26 LAB — CMP (CANCER CENTER ONLY)
ALT: 17 U/L (ref 0–44)
AST: 14 U/L — ABNORMAL LOW (ref 15–41)
Albumin: 3.8 g/dL (ref 3.5–5.0)
Alkaline Phosphatase: 67 U/L (ref 38–126)
Anion gap: 9 (ref 5–15)
BUN: 15 mg/dL (ref 6–20)
CO2: 23 mmol/L (ref 22–32)
Calcium: 8.9 mg/dL (ref 8.9–10.3)
Chloride: 105 mmol/L (ref 98–111)
Creatinine: 0.67 mg/dL (ref 0.61–1.24)
GFR, Estimated: >60 mL/min (ref >60)
Glucose, Bld: 139 mg/dL — ABNORMAL HIGH (ref 70–99)
Potassium: 3.9 mmol/L (ref 3.5–5.1)
Sodium: 137 mmol/L (ref 135–145)
Total Bilirubin: 0.5 mg/dL (ref 0.3–1.2)
Total Protein: 6.2 g/dL — ABNORMAL LOW (ref 6.5–8.1)

## 2021-07-26 LAB — CBC WITH DIFFERENTIAL (CANCER CENTER ONLY)
Abs Immature Granulocytes: 0.06 10*3/uL (ref 0.00–0.07)
Basophils Absolute: 0 10*3/uL (ref 0.0–0.1)
Basophils Relative: 1 %
Eosinophils Absolute: 0.2 10*3/uL (ref 0.0–0.5)
Eosinophils Relative: 4 %
HCT: 39.4 % (ref 39.0–52.0)
Hemoglobin: 12.9 g/dL — ABNORMAL LOW (ref 13.0–17.0)
Immature Granulocytes: 1 %
Lymphocytes Relative: 22 %
Lymphs Abs: 1 10*3/uL (ref 0.7–4.0)
MCH: 29.7 pg (ref 26.0–34.0)
MCHC: 32.7 g/dL (ref 30.0–36.0)
MCV: 90.8 fL (ref 80.0–100.0)
Monocytes Absolute: 0.5 10*3/uL (ref 0.1–1.0)
Monocytes Relative: 11 %
Neutro Abs: 2.9 10*3/uL (ref 1.7–7.7)
Neutrophils Relative %: 61 %
Platelet Count: 173 10*3/uL (ref 150–400)
RBC: 4.34 MIL/uL (ref 4.22–5.81)
RDW: 16.5 % — ABNORMAL HIGH (ref 11.5–15.5)
WBC Count: 4.7 10*3/uL (ref 4.0–10.5)
nRBC: 0.6 % — ABNORMAL HIGH (ref 0.0–0.2)

## 2021-07-26 MED ORDER — SODIUM CHLORIDE 0.9% FLUSH
10.0000 mL | Freq: Once | INTRAVENOUS | Status: AC
  Administered 2021-07-26: 10 mL via INTRAVENOUS

## 2021-07-26 MED ORDER — HEPARIN SOD (PORK) LOCK FLUSH 100 UNIT/ML IV SOLN
500.0000 [IU] | Freq: Once | INTRAVENOUS | Status: AC
  Administered 2021-07-26: 500 [IU] via INTRAVENOUS

## 2021-07-26 MED ORDER — ALTEPLASE 2 MG IJ SOLR
2.0000 mg | Freq: Once | INTRAMUSCULAR | Status: AC
  Administered 2021-07-26: 2 mg

## 2021-07-26 NOTE — Progress Notes (Signed)
  [facility] OFFICE PROGRESS NOTE   Diagnosis: Rectal cancer  INTERVAL HISTORY:   Eric Lewis returns as scheduled.  He began radiation/Xeloda 07/12/2021.  He denies nausea/vomiting.  No mouth sores.  Mild diarrhea.  No hand or foot pain or redness.  No consistent numbness/tingling in the hands or feet.  Objective:  Vital signs in last 24 hours:  Blood pressure 134/85, pulse 98, temperature 97.8 F (36.6 C), temperature source Temporal, resp. rate 20, weight 248 lb 9.6 oz (112.8 kg), SpO2 99 %.    HEENT: No thrush or ulcers. Resp: Lungs clear bilaterally. Cardio: Regular rate and rhythm. GI: Abdomen soft and nontender.  No hepatosplenomegaly. Vascular: No leg edema. Skin: Palms without erythema. Port-A-Cath without erythema   Lab Results:  Lab Results  Component Value Date   WBC 4.7 07/26/2021   HGB 12.9 (L) 07/26/2021   HCT 39.4 07/26/2021   MCV 90.8 07/26/2021   PLT 173 07/26/2021   NEUTROABS 2.9 07/26/2021    Imaging:  No results found.  Medications: I have reviewed the patient's current medications.  Assessment/Plan: Rectal cancer Colonoscopy 02/15/2021- nonobstructing mass at 2-3 cm in the anal verge, removed in a piecemeal fashion, pathology confirmed moderately differentiated adenocarcinoma CTs chest, abdomen, and pelvis on 02/17/2021- no evidence of metastatic disease, no clear mass identified, areas of mild rectal wall thickening MRI 03/02/2021- rectal tumor not identified, no extension beyond the muscularis identified.  N1 disease with multiple left mesorectal nodes, and a 7 mm node beyond the mesorectum at the hypogastric region Cycle 1 FOLFOX 03/22/2021 Cycle 2 FOLFOX 04/05/2021 Cycle 3 FOLFOX 04/19/2021 Cycle 4 FOLFOX 05/03/2021 Cycle 5 FOLFOX 05/17/2021 Cycle 6 FOLFOX 05/31/2021 Cycle 7 FOLFOX 06/14/2021-Oxaliplatin infusion time lengthened, Benadryl and Pepcid added to premedications due to coughing, sneezing, throat clearing, diaphoresis during  Oxaliplatin cycle 6 Cycle 8 FOLFOX 06/28/2021 Radiation/Xeloda 07/12/2021   History of dysphagia-status post a barium swallow 10/22/2020- mild smooth stricture at the GE junction Psoriatic arthritis Diabetes COVID-19 January 22 Coughing, sneezing, throat clearing, diaphoresis during Oxaliplatin infusion cycle 6 FOLFOX Anaphylactic reaction to oxaliplatin 06/28/2021  Disposition: Eric Lewis appears stable.  He continues radiation/Xeloda.  He is tolerating treatment well.  CBC from today reviewed.  Counts adequate to continue Xeloda.  He will return for lab and follow-up in approximately 2 weeks.  He will contact the office in the interim with any problems.    Ned Card ANP/GNP-BC   07/26/2021  11:24 AM

## 2021-07-26 NOTE — Telephone Encounter (Signed)
Provided patient return appointment at [facility] with Dr. Ronita Hipps on 10/27 at 0945/1000. Clinic 2-2 location.

## 2021-07-26 NOTE — Addendum Note (Signed)
Addended by: Gerhard Perches on: 07/26/2021 11:39 AM   Modules accepted: Orders

## 2021-07-27 ENCOUNTER — Ambulatory Visit
Admission: RE | Admit: 2021-07-27 | Discharge: 2021-07-27 | Disposition: A | Discharge status: Home / Self Care | Payer: BC Managed Care – PPO | Source: Ambulatory Visit | Attending: Radiation Oncology | Admitting: Radiation Oncology

## 2021-07-27 ENCOUNTER — Other Ambulatory Visit: Payer: Self-pay

## 2021-07-27 DIAGNOSIS — C20 Malignant neoplasm of rectum: Secondary | ICD-10-CM | POA: Diagnosis not present

## 2021-07-28 ENCOUNTER — Ambulatory Visit
Admission: RE | Admit: 2021-07-28 | Discharge: 2021-07-28 | Disposition: A | Discharge status: Home / Self Care | Payer: BC Managed Care – PPO | Source: Ambulatory Visit | Attending: Radiation Oncology | Admitting: Radiation Oncology

## 2021-07-28 DIAGNOSIS — C20 Malignant neoplasm of rectum: Secondary | ICD-10-CM | POA: Diagnosis not present

## 2021-07-29 ENCOUNTER — Other Ambulatory Visit: Payer: Self-pay

## 2021-07-29 ENCOUNTER — Ambulatory Visit
Admission: RE | Admit: 2021-07-29 | Discharge: 2021-07-29 | Disposition: A | Discharge status: Home / Self Care | Payer: BC Managed Care – PPO | Source: Ambulatory Visit | Attending: Radiation Oncology | Admitting: Radiation Oncology

## 2021-07-29 DIAGNOSIS — C20 Malignant neoplasm of rectum: Secondary | ICD-10-CM | POA: Diagnosis not present

## 2021-08-01 ENCOUNTER — Other Ambulatory Visit (HOSPITAL_COMMUNITY): Payer: Self-pay

## 2021-08-01 ENCOUNTER — Ambulatory Visit
Admission: RE | Admit: 2021-08-01 | Discharge: 2021-08-01 | Disposition: A | Discharge status: Home / Self Care | Payer: BC Managed Care – PPO | Source: Ambulatory Visit | Attending: Radiation Oncology | Admitting: Radiation Oncology

## 2021-08-01 DIAGNOSIS — C20 Malignant neoplasm of rectum: Secondary | ICD-10-CM | POA: Diagnosis not present

## 2021-08-02 ENCOUNTER — Other Ambulatory Visit: Payer: Self-pay

## 2021-08-02 ENCOUNTER — Ambulatory Visit
Admission: RE | Admit: 2021-08-02 | Discharge: 2021-08-02 | Disposition: A | Discharge status: Home / Self Care | Payer: BC Managed Care – PPO | Source: Ambulatory Visit | Attending: Radiation Oncology | Admitting: Radiation Oncology

## 2021-08-02 DIAGNOSIS — C20 Malignant neoplasm of rectum: Secondary | ICD-10-CM | POA: Diagnosis not present

## 2021-08-03 ENCOUNTER — Ambulatory Visit
Admission: RE | Admit: 2021-08-03 | Discharge: 2021-08-03 | Disposition: A | Discharge status: Home / Self Care | Payer: BC Managed Care – PPO | Source: Ambulatory Visit | Attending: Radiation Oncology | Admitting: Radiation Oncology

## 2021-08-03 DIAGNOSIS — C20 Malignant neoplasm of rectum: Secondary | ICD-10-CM | POA: Diagnosis not present

## 2021-08-04 ENCOUNTER — Ambulatory Visit
Admission: RE | Admit: 2021-08-04 | Discharge: 2021-08-04 | Disposition: A | Discharge status: Home / Self Care | Payer: BC Managed Care – PPO | Source: Ambulatory Visit | Attending: Radiation Oncology | Admitting: Radiation Oncology

## 2021-08-04 DIAGNOSIS — C20 Malignant neoplasm of rectum: Secondary | ICD-10-CM | POA: Diagnosis not present

## 2021-08-05 ENCOUNTER — Ambulatory Visit
Admission: RE | Admit: 2021-08-05 | Discharge: 2021-08-05 | Disposition: A | Discharge status: Home / Self Care | Payer: BC Managed Care – PPO | Source: Ambulatory Visit | Attending: Radiation Oncology | Admitting: Radiation Oncology

## 2021-08-05 ENCOUNTER — Other Ambulatory Visit: Payer: Self-pay

## 2021-08-05 DIAGNOSIS — C20 Malignant neoplasm of rectum: Secondary | ICD-10-CM | POA: Diagnosis not present

## 2021-08-08 ENCOUNTER — Ambulatory Visit
Admission: RE | Admit: 2021-08-08 | Discharge: 2021-08-08 | Disposition: A | Discharge status: Home / Self Care | Payer: BC Managed Care – PPO | Source: Ambulatory Visit | Attending: Radiation Oncology | Admitting: Radiation Oncology

## 2021-08-08 DIAGNOSIS — Z8616 Personal history of COVID-19: Secondary | ICD-10-CM | POA: Diagnosis not present

## 2021-08-08 DIAGNOSIS — C20 Malignant neoplasm of rectum: Secondary | ICD-10-CM | POA: Insufficient documentation

## 2021-08-08 DIAGNOSIS — E119 Type 2 diabetes mellitus without complications: Secondary | ICD-10-CM | POA: Diagnosis not present

## 2021-08-08 DIAGNOSIS — L4050 Arthropathic psoriasis, unspecified: Secondary | ICD-10-CM | POA: Diagnosis not present

## 2021-08-09 ENCOUNTER — Ambulatory Visit
Admission: RE | Admit: 2021-08-09 | Discharge: 2021-08-09 | Disposition: A | Discharge status: Home / Self Care | Payer: BC Managed Care – PPO | Source: Ambulatory Visit | Attending: Radiation Oncology | Admitting: Radiation Oncology

## 2021-08-09 ENCOUNTER — Inpatient Hospital Stay: Payer: BC Managed Care – PPO | Attending: Oncology | Admitting: Oncology

## 2021-08-09 ENCOUNTER — Inpatient Hospital Stay: Payer: BC Managed Care – PPO

## 2021-08-09 ENCOUNTER — Other Ambulatory Visit: Payer: Self-pay

## 2021-08-09 VITALS — BP 128/89 | HR 93 | Temp 98.7°F | Resp 18 | Ht 67.0 in | Wt 243.8 lb

## 2021-08-09 DIAGNOSIS — Z8616 Personal history of COVID-19: Secondary | ICD-10-CM | POA: Insufficient documentation

## 2021-08-09 DIAGNOSIS — C20 Malignant neoplasm of rectum: Secondary | ICD-10-CM | POA: Diagnosis not present

## 2021-08-09 DIAGNOSIS — L4050 Arthropathic psoriasis, unspecified: Secondary | ICD-10-CM | POA: Diagnosis not present

## 2021-08-09 DIAGNOSIS — E119 Type 2 diabetes mellitus without complications: Secondary | ICD-10-CM | POA: Diagnosis not present

## 2021-08-09 DIAGNOSIS — Z95828 Presence of other vascular implants and grafts: Secondary | ICD-10-CM

## 2021-08-09 LAB — CBC WITH DIFFERENTIAL (CANCER CENTER ONLY)
Abs Immature Granulocytes: 0.05 10*3/uL (ref 0.00–0.07)
Basophils Absolute: 0.1 10*3/uL (ref 0.0–0.1)
Basophils Relative: 1 %
Eosinophils Absolute: 0.2 10*3/uL (ref 0.0–0.5)
Eosinophils Relative: 4 %
HCT: 41 % (ref 39.0–52.0)
Hemoglobin: 13.8 g/dL (ref 13.0–17.0)
Immature Granulocytes: 1 %
Lymphocytes Relative: 13 %
Lymphs Abs: 0.7 10*3/uL (ref 0.7–4.0)
MCH: 30.9 pg (ref 26.0–34.0)
MCHC: 33.7 g/dL (ref 30.0–36.0)
MCV: 91.7 fL (ref 80.0–100.0)
Monocytes Absolute: 0.6 10*3/uL (ref 0.1–1.0)
Monocytes Relative: 13 %
Neutro Abs: 3.4 10*3/uL (ref 1.7–7.7)
Neutrophils Relative %: 68 %
Platelet Count: 199 10*3/uL (ref 150–400)
RBC: 4.47 MIL/uL (ref 4.22–5.81)
RDW: 17.7 % — ABNORMAL HIGH (ref 11.5–15.5)
WBC Count: 5.1 10*3/uL (ref 4.0–10.5)
nRBC: 0 % (ref 0.0–0.2)

## 2021-08-09 LAB — CMP (CANCER CENTER ONLY)
ALT: 19 U/L (ref 0–44)
AST: 17 U/L (ref 15–41)
Albumin: 4.3 g/dL (ref 3.5–5.0)
Alkaline Phosphatase: 67 U/L (ref 38–126)
Anion gap: 10 (ref 5–15)
BUN: 12 mg/dL (ref 6–20)
CO2: 23 mmol/L (ref 22–32)
Calcium: 9.2 mg/dL (ref 8.9–10.3)
Chloride: 103 mmol/L (ref 98–111)
Creatinine: 0.83 mg/dL (ref 0.61–1.24)
GFR, Estimated: >60 mL/min (ref >60)
Glucose, Bld: 120 mg/dL — ABNORMAL HIGH (ref 70–99)
Potassium: 4 mmol/L (ref 3.5–5.1)
Sodium: 136 mmol/L (ref 135–145)
Total Bilirubin: 0.7 mg/dL (ref 0.3–1.2)
Total Protein: 6.8 g/dL (ref 6.5–8.1)

## 2021-08-09 MED ORDER — SODIUM CHLORIDE 0.9% FLUSH
10.0000 mL | Freq: Once | INTRAVENOUS | Status: AC
  Administered 2021-08-09: 10 mL via INTRAVENOUS

## 2021-08-09 MED ORDER — HEPARIN SOD (PORK) LOCK FLUSH 100 UNIT/ML IV SOLN
500.0000 [IU] | Freq: Once | INTRAVENOUS | Status: AC
  Administered 2021-08-09: 500 [IU] via INTRAVENOUS

## 2021-08-09 NOTE — Patient Instructions (Signed)
Implanted Port Home Guide An implanted port is a device that is placed under the skin. It is usually placed in the chest. The device can be used to give IV medicine, to take blood, or for dialysis. You may have an implanted port if: You need IV medicine that would be irritating to the small veins in your hands or arms. You need IV medicines, such as antibiotics, for a long period of time. You need IV nutrition for a long period of time. You need dialysis. When you have a port, your health care provider can choose to use the port instead of veins in your arms for these procedures. You may have fewer limitations when using a port than you would if you used other types of long-term IVs, and you will likely be able to return to normal activities after your incision heals. An implanted port has two main parts: Reservoir. The reservoir is the part where a needle is inserted to give medicines or draw blood. The reservoir is round. After it is placed, it appears as a small, raised area under your skin. Catheter. The catheter is a thin, flexible tube that connects the reservoir to a vein. Medicine that is inserted into the reservoir goes into the catheter and then into the vein. How is my port accessed? To access your port: A numbing cream may be placed on the skin over the port site. Your health care provider will put on a mask and sterile gloves. The skin over your port will be cleaned carefully with a germ-killing soap and allowed to dry. Your health care provider will gently pinch the port and insert a needle into it. Your health care provider will check for a blood return to make sure the port is in the vein and is not clogged. If your port needs to remain accessed to get medicine continuously (constant infusion), your health care provider will place a clear bandage (dressing) over the needle site. The dressing and needle will need to be changed every week, or as told by your health care provider. What  is flushing? Flushing helps keep the port from getting clogged. Follow instructions from your health care provider about how and when to flush the port. Ports are usually flushed with saline solution or a medicine called heparin. The need for flushing will depend on how the port is used: If the port is only used from time to time to give medicines or draw blood, the port may need to be flushed: Before and after medicines have been given. Before and after blood has been drawn. As part of routine maintenance. Flushing may be recommended every 4-6 weeks. If a constant infusion is running, the port may not need to be flushed. Throw away any syringes in a disposal container that is meant for sharp items (sharps container). You can buy a sharps container from a pharmacy, or you can make one by using an empty hard plastic bottle with a cover. How long will my port stay implanted? The port can stay in for as long as your health care provider thinks it is needed. When it is time for the port to come out, a surgery will be done to remove it. The surgery will be similar to the procedure that was done to put the port in. Follow these instructions at home:  Flush your port as told by your health care provider. If you need an infusion over several days, follow instructions from your health care provider about how   to take care of your port site. Make sure you: Wash your hands with soap and water before you change your dressing. If soap and water are not available, use alcohol-based hand sanitizer. Change your dressing as told by your health care provider. Place any used dressings or infusion bags into a plastic bag. Throw that bag in the trash. Keep the dressing that covers the needle clean and dry. Do not get it wet. Do not use scissors or sharp objects near the tube. Keep the tube clamped, unless it is being used. Check your port site every day for signs of infection. Check for: Redness, swelling, or  pain. Fluid or blood. Pus or a bad smell. Protect the skin around the port site. Avoid wearing bra straps that rub or irritate the site. Protect the skin around your port from seat belts. Place a soft pad over your chest if needed. Bathe or shower as told by your health care provider. The site may get wet as long as you are not actively receiving an infusion. Return to your normal activities as told by your health care provider. Ask your health care provider what activities are safe for you. Carry a medical alert card or wear a medical alert bracelet at all times. This will let health care providers know that you have an implanted port in case of an emergency. Get help right away if: You have redness, swelling, or pain at the port site. You have fluid or blood coming from your port site. You have pus or a bad smell coming from the port site. You have a fever. Summary Implanted ports are usually placed in the chest for long-term IV access. Follow instructions from your health care provider about flushing the port and changing bandages (dressings). Take care of the area around your port by avoiding clothing that puts pressure on the area, and by watching for signs of infection. Protect the skin around your port from seat belts. Place a soft pad over your chest if needed. Get help right away if you have a fever or you have redness, swelling, pain, drainage, or a bad smell at the port site. This information is not intended to replace advice given to you by your health care provider. Make sure you discuss any questions you have with your health care provider. Document Revised: 01/12/2021 Document Reviewed: 03/08/2020 Elsevier Patient Education  2022 Elsevier Inc.  

## 2021-08-09 NOTE — Progress Notes (Signed)
  [place] OFFICE PROGRESS NOTE   Diagnosis: Rectal cancer  INTERVAL HISTORY:   Eric. Chmiel returns as scheduled.  He continue Xeloda and concurrent radiation.  He does not have significant neuropathy symptoms at present.  No rash, mouth sores, or hand/foot pain.  He has diarrhea and intermittent rectal bleeding.  He is scheduled for surgical follow-up on 08/31/2021.  Objective:  Vital signs in last 24 hours:  Blood pressure 128/89, pulse 93, temperature 98.7 F (37.1 C), resp. rate 18, height 5\' 7"  (1.702 m), weight 243 lb 12.8 oz (110.6 kg), SpO2 97 %.    HEENT: No thrush or ulcers Resp: Lungs clear bilaterally Cardio: Regular rate and rhythm GI: Nontender, no hepatosplenomegaly Vascular: No leg edema  Skin: Palms without erythema  Portacath/PICC-without erythema  Lab Results:  Lab Results  Component Value Date   WBC 5.1 08/09/2021   HGB 13.8 08/09/2021   HCT 41.0 08/09/2021   MCV 91.7 08/09/2021   PLT 199 08/09/2021   NEUTROABS 3.4 08/09/2021    CMP  Lab Results  Component Value Date   NA 137 07/26/2021   K 3.9 07/26/2021   CL 105 07/26/2021   CO2 23 07/26/2021   GLUCOSE 139 (H) 07/26/2021   BUN 15 07/26/2021   CREATININE 0.67 07/26/2021   CALCIUM 8.9 07/26/2021   PROT 6.2 (L) 07/26/2021   ALBUMIN 3.8 07/26/2021   AST 14 (L) 07/26/2021   ALT 17 07/26/2021   ALKPHOS 67 07/26/2021   BILITOT 0.5 07/26/2021   GFRNONAA >60 07/26/2021   GFRAA >60 05/30/2016    Lab Results  Component Value Date   CEA 4.12 03/22/2021     Medications: I have reviewed the patient's current medications.   Assessment/Plan: Rectal cancer Colonoscopy 02/15/2021- nonobstructing mass at 2-3 cm in the anal verge, removed in a piecemeal fashion, pathology confirmed moderately differentiated adenocarcinoma CTs chest, abdomen, and pelvis on 02/17/2021- no evidence of metastatic disease, no clear mass identified, areas of mild rectal wall thickening MRI 03/02/2021-  rectal tumor not identified, no extension beyond the muscularis identified.  N1 disease with multiple left mesorectal nodes, and a 7 mm node beyond the mesorectum at the hypogastric region Cycle 1 FOLFOX 03/22/2021 Cycle 2 FOLFOX 04/05/2021 Cycle 3 FOLFOX 04/19/2021 Cycle 4 FOLFOX 05/03/2021 Cycle 5 FOLFOX 05/17/2021 Cycle 6 FOLFOX 05/31/2021 Cycle 7 FOLFOX 06/14/2021-Oxaliplatin infusion time lengthened, Benadryl and Pepcid added to premedications due to coughing, sneezing, throat clearing, diaphoresis during Oxaliplatin cycle 6 Cycle 8 FOLFOX 06/28/2021 Radiation/Xeloda 07/12/2021   History of dysphagia-status post a barium swallow 10/22/2020- mild smooth stricture at the GE junction Psoriatic arthritis Diabetes COVID-19 January 22 Coughing, sneezing, throat clearing, diaphoresis during Oxaliplatin infusion cycle 6 FOLFOX Anaphylactic reaction to oxaliplatin 06/28/2021    Disposition: Eric Lewis is completing adjuvant capecitabine and radiation.  He is tolerating the treatment well.  He will complete treatment 08/18/2021.  Is scheduled for surgical follow-up at [facility] on 08/31/2021.  Eric. [name] will be scheduled for an office visit and Port-A-Cath flush on 09/13/2021.  Betsy Coder, MD  08/09/2021  10:38 AM

## 2021-08-10 ENCOUNTER — Ambulatory Visit
Admission: RE | Admit: 2021-08-10 | Discharge: 2021-08-10 | Disposition: A | Discharge status: Home / Self Care | Payer: BC Managed Care – PPO | Source: Ambulatory Visit | Attending: Radiation Oncology | Admitting: Radiation Oncology

## 2021-08-10 DIAGNOSIS — L4050 Arthropathic psoriasis, unspecified: Secondary | ICD-10-CM | POA: Diagnosis not present

## 2021-08-10 DIAGNOSIS — C20 Malignant neoplasm of rectum: Secondary | ICD-10-CM | POA: Diagnosis not present

## 2021-08-10 DIAGNOSIS — Z8616 Personal history of COVID-19: Secondary | ICD-10-CM | POA: Diagnosis not present

## 2021-08-10 DIAGNOSIS — E119 Type 2 diabetes mellitus without complications: Secondary | ICD-10-CM | POA: Diagnosis not present

## 2021-08-11 ENCOUNTER — Other Ambulatory Visit: Payer: Self-pay

## 2021-08-11 ENCOUNTER — Ambulatory Visit
Admission: RE | Admit: 2021-08-11 | Discharge: 2021-08-11 | Disposition: A | Discharge status: Home / Self Care | Payer: BC Managed Care – PPO | Source: Ambulatory Visit | Attending: Radiation Oncology | Admitting: Radiation Oncology

## 2021-08-11 DIAGNOSIS — C20 Malignant neoplasm of rectum: Secondary | ICD-10-CM | POA: Diagnosis not present

## 2021-08-11 DIAGNOSIS — Z8616 Personal history of COVID-19: Secondary | ICD-10-CM | POA: Diagnosis not present

## 2021-08-11 DIAGNOSIS — E119 Type 2 diabetes mellitus without complications: Secondary | ICD-10-CM | POA: Diagnosis not present

## 2021-08-11 DIAGNOSIS — L4050 Arthropathic psoriasis, unspecified: Secondary | ICD-10-CM | POA: Diagnosis not present

## 2021-08-12 ENCOUNTER — Ambulatory Visit
Admission: RE | Admit: 2021-08-12 | Discharge: 2021-08-12 | Disposition: A | Discharge status: Home / Self Care | Payer: BC Managed Care – PPO | Source: Ambulatory Visit | Attending: Radiation Oncology | Admitting: Radiation Oncology

## 2021-08-12 ENCOUNTER — Other Ambulatory Visit: Payer: Self-pay

## 2021-08-12 DIAGNOSIS — Z8616 Personal history of COVID-19: Secondary | ICD-10-CM | POA: Diagnosis not present

## 2021-08-12 DIAGNOSIS — E119 Type 2 diabetes mellitus without complications: Secondary | ICD-10-CM | POA: Diagnosis not present

## 2021-08-12 DIAGNOSIS — C20 Malignant neoplasm of rectum: Secondary | ICD-10-CM | POA: Diagnosis not present

## 2021-08-12 DIAGNOSIS — Z79899 Other long term (current) drug therapy: Secondary | ICD-10-CM | POA: Diagnosis not present

## 2021-08-12 DIAGNOSIS — L4050 Arthropathic psoriasis, unspecified: Secondary | ICD-10-CM | POA: Diagnosis not present

## 2021-08-12 DIAGNOSIS — L409 Psoriasis, unspecified: Secondary | ICD-10-CM | POA: Diagnosis not present

## 2021-08-15 ENCOUNTER — Other Ambulatory Visit: Payer: Self-pay

## 2021-08-15 ENCOUNTER — Ambulatory Visit
Admission: RE | Admit: 2021-08-15 | Discharge: 2021-08-15 | Disposition: A | Discharge status: Home / Self Care | Payer: BC Managed Care – PPO | Source: Ambulatory Visit | Attending: Radiation Oncology | Admitting: Radiation Oncology

## 2021-08-15 DIAGNOSIS — L4050 Arthropathic psoriasis, unspecified: Secondary | ICD-10-CM | POA: Diagnosis not present

## 2021-08-15 DIAGNOSIS — E119 Type 2 diabetes mellitus without complications: Secondary | ICD-10-CM | POA: Diagnosis not present

## 2021-08-15 DIAGNOSIS — C20 Malignant neoplasm of rectum: Secondary | ICD-10-CM | POA: Diagnosis not present

## 2021-08-15 DIAGNOSIS — Z8616 Personal history of COVID-19: Secondary | ICD-10-CM | POA: Diagnosis not present

## 2021-08-16 ENCOUNTER — Ambulatory Visit
Admission: RE | Admit: 2021-08-16 | Discharge: 2021-08-16 | Disposition: A | Discharge status: Home / Self Care | Payer: BC Managed Care – PPO | Source: Ambulatory Visit | Attending: Radiation Oncology | Admitting: Radiation Oncology

## 2021-08-16 DIAGNOSIS — C20 Malignant neoplasm of rectum: Secondary | ICD-10-CM | POA: Diagnosis not present

## 2021-08-16 DIAGNOSIS — Z8616 Personal history of COVID-19: Secondary | ICD-10-CM | POA: Diagnosis not present

## 2021-08-16 DIAGNOSIS — E119 Type 2 diabetes mellitus without complications: Secondary | ICD-10-CM | POA: Diagnosis not present

## 2021-08-16 DIAGNOSIS — L4050 Arthropathic psoriasis, unspecified: Secondary | ICD-10-CM | POA: Diagnosis not present

## 2021-08-17 ENCOUNTER — Other Ambulatory Visit: Payer: Self-pay

## 2021-08-17 ENCOUNTER — Ambulatory Visit
Admission: RE | Admit: 2021-08-17 | Discharge: 2021-08-17 | Disposition: A | Discharge status: Home / Self Care | Payer: BC Managed Care – PPO | Source: Ambulatory Visit | Attending: Radiation Oncology | Admitting: Radiation Oncology

## 2021-08-17 DIAGNOSIS — Z8616 Personal history of COVID-19: Secondary | ICD-10-CM | POA: Diagnosis not present

## 2021-08-17 DIAGNOSIS — L4050 Arthropathic psoriasis, unspecified: Secondary | ICD-10-CM | POA: Diagnosis not present

## 2021-08-17 DIAGNOSIS — C20 Malignant neoplasm of rectum: Secondary | ICD-10-CM | POA: Diagnosis not present

## 2021-08-17 DIAGNOSIS — E119 Type 2 diabetes mellitus without complications: Secondary | ICD-10-CM | POA: Diagnosis not present

## 2021-08-18 ENCOUNTER — Ambulatory Visit
Admission: RE | Admit: 2021-08-18 | Discharge: 2021-08-18 | Disposition: A | Discharge status: Home / Self Care | Payer: BC Managed Care – PPO | Source: Ambulatory Visit | Attending: Radiation Oncology | Admitting: Radiation Oncology

## 2021-08-18 ENCOUNTER — Encounter: Payer: Self-pay | Admitting: Radiation Oncology

## 2021-08-18 DIAGNOSIS — L4050 Arthropathic psoriasis, unspecified: Secondary | ICD-10-CM | POA: Diagnosis not present

## 2021-08-18 DIAGNOSIS — E119 Type 2 diabetes mellitus without complications: Secondary | ICD-10-CM | POA: Diagnosis not present

## 2021-08-18 DIAGNOSIS — C20 Malignant neoplasm of rectum: Secondary | ICD-10-CM | POA: Diagnosis not present

## 2021-08-18 DIAGNOSIS — Z8616 Personal history of COVID-19: Secondary | ICD-10-CM | POA: Diagnosis not present

## 2021-08-22 ENCOUNTER — Other Ambulatory Visit (HOSPITAL_COMMUNITY): Payer: Self-pay

## 2021-08-22 NOTE — Progress Notes (Signed)
                                                                                                                                                             Patient Name: Eric Lewis MRN: [id number] DOB: 05/29/1971 Referring Physician: Betsy Coder (Profile Not Attached) Date of Service: 08/18/2021 [facility], [place]                                                        End Of Treatment Note  Diagnoses: C20-Malignant neoplasm of rectum  Cancer Staging:  At least Stage IIIA, cTxN1 Adenocarcinoma of the Rectum  Intent: Curative  Radiation Treatment Dates: 07/12/2021 through 08/18/2021 Site Technique Total Dose (Gy) Dose per Fx (Gy) Completed Fx Beam Energies  Rectum: Rectum 3D 45/45 1.8 25/25 10X, 15X  Rectum: Rectum_Bst 3D 5.4/5.4 1.8 3/3 6X, 15X   Narrative: The patient tolerated radiation therapy relatively well. He did not have fatigue or significant bowel changes, but did have urinary frequency at the conclusion of radiotherapy.  Plan: The patient will receive a call in about one month from the radiation oncology department. He will continue follow up with Dr. Benay Spice as well as Dr. Ronita Hipps at [facility] ________________________________________________    Carola Rhine, [facility]

## 2021-08-25 DIAGNOSIS — C20 Malignant neoplasm of rectum: Secondary | ICD-10-CM | POA: Diagnosis not present

## 2021-08-31 DIAGNOSIS — C20 Malignant neoplasm of rectum: Secondary | ICD-10-CM | POA: Diagnosis not present

## 2021-09-12 ENCOUNTER — Other Ambulatory Visit: Payer: BC Managed Care – PPO

## 2021-09-12 ENCOUNTER — Ambulatory Visit: Payer: BC Managed Care – PPO | Admitting: Nurse Practitioner

## 2021-09-13 ENCOUNTER — Ambulatory Visit: Payer: BC Managed Care – PPO | Admitting: Nurse Practitioner

## 2021-09-13 ENCOUNTER — Other Ambulatory Visit: Payer: BC Managed Care – PPO

## 2021-09-15 ENCOUNTER — Encounter: Payer: Self-pay | Admitting: Nurse Practitioner

## 2021-09-15 ENCOUNTER — Inpatient Hospital Stay: Payer: BC Managed Care – PPO

## 2021-09-15 ENCOUNTER — Inpatient Hospital Stay: Payer: BC Managed Care – PPO | Attending: Oncology

## 2021-09-15 ENCOUNTER — Inpatient Hospital Stay (HOSPITAL_BASED_OUTPATIENT_CLINIC_OR_DEPARTMENT_OTHER): Payer: BC Managed Care – PPO | Admitting: Nurse Practitioner

## 2021-09-15 ENCOUNTER — Other Ambulatory Visit: Payer: Self-pay

## 2021-09-15 VITALS — BP 132/87 | HR 100 | Temp 98.7°F | Resp 18 | Ht 67.0 in | Wt 242.3 lb

## 2021-09-15 DIAGNOSIS — L4050 Arthropathic psoriasis, unspecified: Secondary | ICD-10-CM | POA: Diagnosis not present

## 2021-09-15 DIAGNOSIS — C20 Malignant neoplasm of rectum: Secondary | ICD-10-CM | POA: Insufficient documentation

## 2021-09-15 DIAGNOSIS — M542 Cervicalgia: Secondary | ICD-10-CM | POA: Insufficient documentation

## 2021-09-15 DIAGNOSIS — Z95828 Presence of other vascular implants and grafts: Secondary | ICD-10-CM

## 2021-09-15 DIAGNOSIS — Z923 Personal history of irradiation: Secondary | ICD-10-CM | POA: Insufficient documentation

## 2021-09-15 DIAGNOSIS — E119 Type 2 diabetes mellitus without complications: Secondary | ICD-10-CM | POA: Insufficient documentation

## 2021-09-15 DIAGNOSIS — Z8616 Personal history of COVID-19: Secondary | ICD-10-CM | POA: Diagnosis not present

## 2021-09-15 LAB — CBC WITH DIFFERENTIAL (CANCER CENTER ONLY)
Abs Immature Granulocytes: 0.04 10*3/uL (ref 0.00–0.07)
Basophils Absolute: 0.1 10*3/uL (ref 0.0–0.1)
Basophils Relative: 1 %
Eosinophils Absolute: 0.2 10*3/uL (ref 0.0–0.5)
Eosinophils Relative: 4 %
HCT: 44 % (ref 39.0–52.0)
Hemoglobin: 14.4 g/dL (ref 13.0–17.0)
Immature Granulocytes: 1 %
Lymphocytes Relative: 17 %
Lymphs Abs: 1.1 10*3/uL (ref 0.7–4.0)
MCH: 30.8 pg (ref 26.0–34.0)
MCHC: 32.7 g/dL (ref 30.0–36.0)
MCV: 94.2 fL (ref 80.0–100.0)
Monocytes Absolute: 0.7 10*3/uL (ref 0.1–1.0)
Monocytes Relative: 12 %
Neutro Abs: 4.2 10*3/uL (ref 1.7–7.7)
Neutrophils Relative %: 65 %
Platelet Count: 243 10*3/uL (ref 150–400)
RBC: 4.67 MIL/uL (ref 4.22–5.81)
RDW: 14.6 % (ref 11.5–15.5)
WBC Count: 6.4 10*3/uL (ref 4.0–10.5)
nRBC: 0 % (ref 0.0–0.2)

## 2021-09-15 LAB — CMP (CANCER CENTER ONLY)
ALT: 16 U/L (ref 0–44)
AST: 15 U/L (ref 15–41)
Albumin: 4.4 g/dL (ref 3.5–5.0)
Alkaline Phosphatase: 66 U/L (ref 38–126)
Anion gap: 10 (ref 5–15)
BUN: 14 mg/dL (ref 6–20)
CO2: 26 mmol/L (ref 22–32)
Calcium: 9.3 mg/dL (ref 8.9–10.3)
Chloride: 103 mmol/L (ref 98–111)
Creatinine: 0.97 mg/dL (ref 0.61–1.24)
GFR, Estimated: >60 mL/min (ref >60)
Glucose, Bld: 112 mg/dL — ABNORMAL HIGH (ref 70–99)
Potassium: 4.3 mmol/L (ref 3.5–5.1)
Sodium: 139 mmol/L (ref 135–145)
Total Bilirubin: 0.6 mg/dL (ref 0.3–1.2)
Total Protein: 6.9 g/dL (ref 6.5–8.1)

## 2021-09-15 LAB — CEA (ACCESS): CEA (CHCC): 3.01 ng/mL (ref 0.00–5.00)

## 2021-09-15 MED ORDER — SODIUM CHLORIDE 0.9% FLUSH
10.0000 mL | Freq: Once | INTRAVENOUS | Status: AC
  Administered 2021-09-15: 10 mL via INTRAVENOUS

## 2021-09-15 MED ORDER — HEPARIN SOD (PORK) LOCK FLUSH 100 UNIT/ML IV SOLN
500.0000 [IU] | Freq: Once | INTRAVENOUS | Status: AC
  Administered 2021-09-15: 500 [IU] via INTRAVENOUS

## 2021-09-15 NOTE — Progress Notes (Signed)
  [place] OFFICE PROGRESS NOTE   Diagnosis: Rectal cancer  INTERVAL HISTORY:   Eric Lewis returns as scheduled.  He completed the course of radiation/Xeloda 08/18/2021.  Bowels overall moving regularly.  He intermittently notes bleeding.  No pain associated with bowel movements.  He has a good appetite.  Occasional neuropathy symptoms involving the hands mainly left.  When the Port-A-Cath was flushed today he felt pain on the right side of his neck.  He reports surgery is scheduled mid December.  Objective:  Vital signs in last 24 hours:  Blood pressure 132/87, pulse 100, temperature 98.7 F (37.1 C), temperature source Oral, resp. rate 18, height 5\' 7"  (1.702 m), weight 242 lb 4.8 oz (109.9 kg), SpO2 98 %.    HEENT: No thrush or ulcers. Lymphatics: No palpable cervical or supraclavicular lymph nodes. Resp: Lungs clear bilaterally. Cardio: Regular rate and rhythm. GI: Abdomen soft and nontender.  No hepatomegaly. Vascular: No upper extremity or lower extremity edema.  Neck does not appear edematous. Skin: Palms without erythema. Port-A-Cath without erythema.   Lab Results:  Lab Results  Component Value Date   WBC 6.4 09/15/2021   HGB 14.4 09/15/2021   HCT 44.0 09/15/2021   MCV 94.2 09/15/2021   PLT 243 09/15/2021   NEUTROABS 4.2 09/15/2021    Imaging:  No results found.  Medications: I have reviewed the patient's current medications.  Assessment/Plan: Rectal cancer Colonoscopy 02/15/2021- nonobstructing mass at 2-3 cm in the anal verge, removed in a piecemeal fashion, pathology confirmed moderately differentiated adenocarcinoma CTs chest, abdomen, and pelvis on 02/17/2021- no evidence of metastatic disease, no clear mass identified, areas of mild rectal wall thickening MRI 03/02/2021- rectal tumor not identified, no extension beyond the muscularis identified.  N1 disease with multiple left mesorectal nodes, and a 7 mm node beyond the mesorectum at the  hypogastric region Cycle 1 FOLFOX 03/22/2021 Cycle 2 FOLFOX 04/05/2021 Cycle 3 FOLFOX 04/19/2021 Cycle 4 FOLFOX 05/03/2021 Cycle 5 FOLFOX 05/17/2021 Cycle 6 FOLFOX 05/31/2021 Cycle 7 FOLFOX 06/14/2021-Oxaliplatin infusion time lengthened, Benadryl and Pepcid added to premedications due to coughing, sneezing, throat clearing, diaphoresis during Oxaliplatin cycle 6 Cycle 8 FOLFOX 06/28/2021 Radiation/Xeloda 07/12/2021-08/18/2021 MRI pelvis 08/25/2021 at [place] rectal tumor not definitely visualized with persistent irregular anterior rectal wall thickening and redemonstrated irregularity of the muscularis propria.  Decreased size of left mesorectal and left internal iliac lymph nodes.   History of dysphagia-status post a barium swallow 10/22/2020- mild smooth stricture at the GE junction Psoriatic arthritis Diabetes COVID-19 January 22 Coughing, sneezing, throat clearing, diaphoresis during Oxaliplatin infusion cycle 6 FOLFOX Anaphylactic reaction to oxaliplatin 06/28/2021  Disposition: [name] appears stable.  He has completed the course of neoadjuvant therapy.  Surgery is planned for mid December.  He experienced right-sided neck pain with the Port-A-Cath flush today.  He will contact the office with recurrent pain, swelling, fever.  He will return for port flush and follow-up in 4 weeks.  He will contact the office in the interim as outlined above or with any other problems.    Ned Card ANP/GNP-BC   09/15/2021  2:43 PM

## 2021-09-16 ENCOUNTER — Telehealth: Payer: Self-pay | Admitting: Emergency Medicine

## 2021-09-16 ENCOUNTER — Other Ambulatory Visit: Payer: Self-pay | Admitting: Nurse Practitioner

## 2021-09-16 ENCOUNTER — Ambulatory Visit (HOSPITAL_BASED_OUTPATIENT_CLINIC_OR_DEPARTMENT_OTHER)
Admission: RE | Admit: 2021-09-16 | Discharge: 2021-09-16 | Disposition: A | Discharge status: Home / Self Care | Payer: BC Managed Care – PPO | Source: Ambulatory Visit | Attending: Nurse Practitioner | Admitting: Nurse Practitioner

## 2021-09-16 DIAGNOSIS — Z95828 Presence of other vascular implants and grafts: Secondary | ICD-10-CM | POA: Diagnosis not present

## 2021-09-16 DIAGNOSIS — C20 Malignant neoplasm of rectum: Secondary | ICD-10-CM | POA: Insufficient documentation

## 2021-09-16 DIAGNOSIS — M542 Cervicalgia: Secondary | ICD-10-CM | POA: Diagnosis not present

## 2021-09-16 IMAGING — DX DG CHEST 2V
2 series · 2 of 2 positions shown · non-contrast
Comparison: X-ray chest [DATE].

CLINICAL DATA: right neck pain with port flush.  Rectal cancer.

EXAM:
CHEST - 2 VIEW

[chest pa]
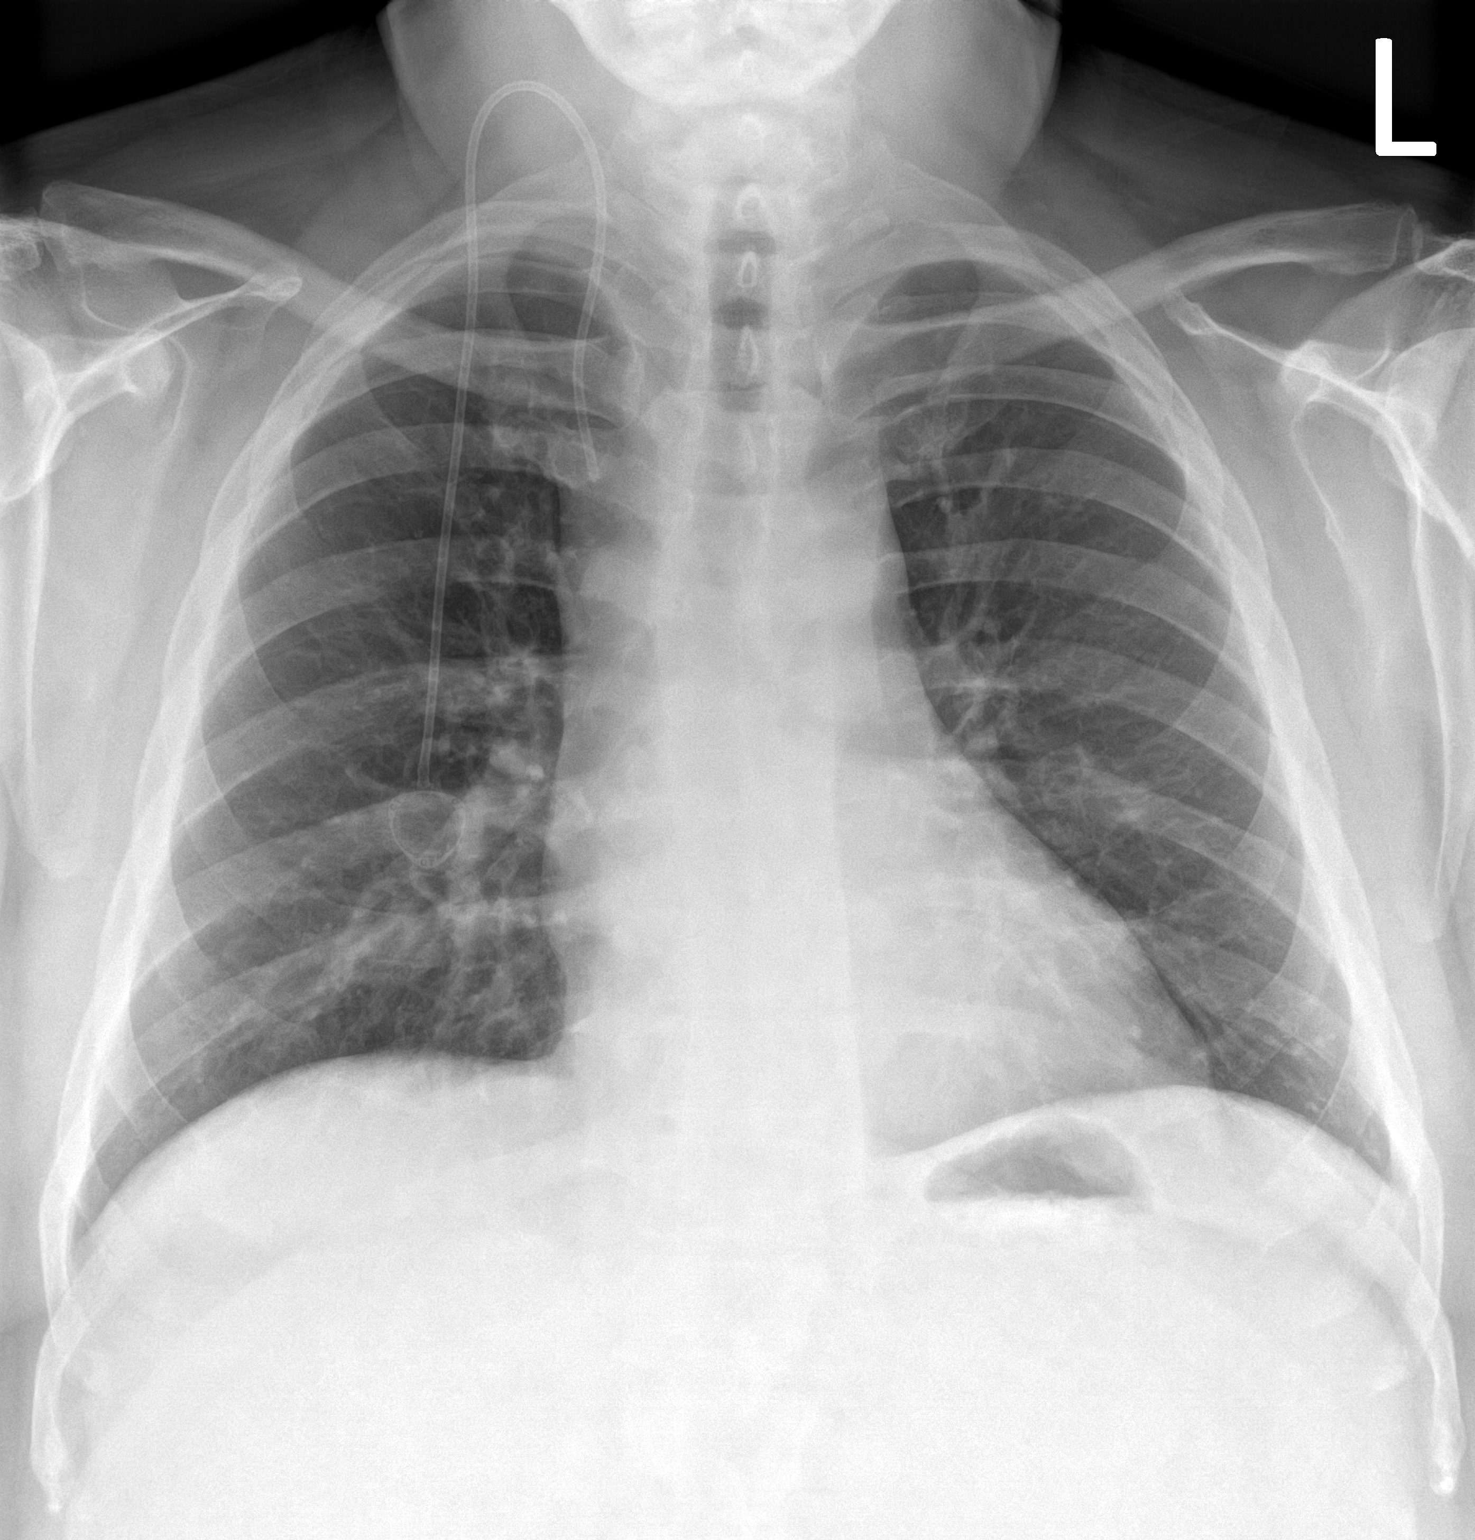

[chest lat]
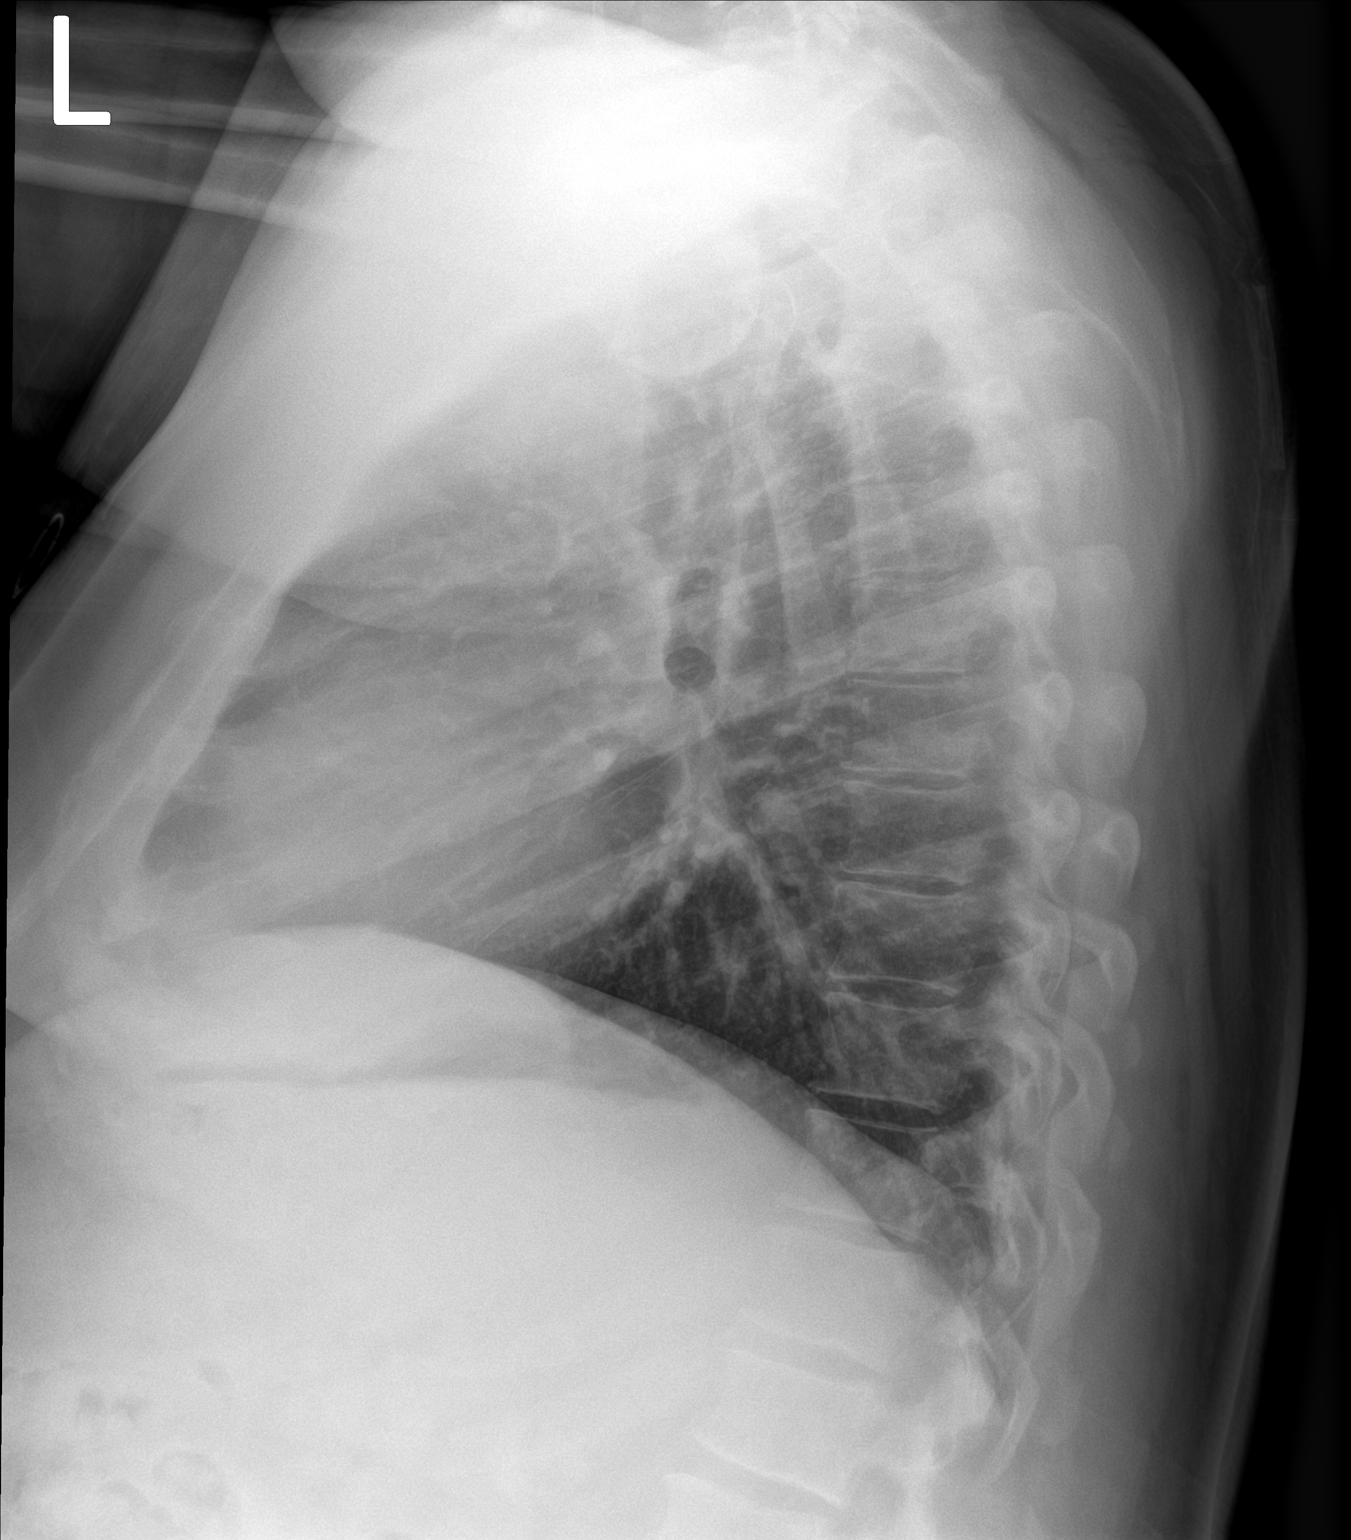

[2 of 2 positions shown; findings below may reference images not displayed]

FINDINGS: Right internal jugular Port-A-Cath terminates in high right
mediastinum likely within the right brachiocephalic vein. Stable
cardiomediastinal silhouette with normal heart size. No
pneumothorax. No pleural effusion. Lungs appear clear, with no acute
consolidative airspace disease and no pulmonary edema.
IMPRESSION: No active cardiopulmonary disease. Right internal jugular
Port-A-Cath terminates in the high right mediastinum likely within
the right brachiocephalic vein.

## 2021-09-16 NOTE — Telephone Encounter (Signed)
Called pt to adivse him per Barbera Setters to come to [place] today to have his port xrayed for pain while flushing his port yesterday.

## 2021-09-19 ENCOUNTER — Encounter: Payer: Self-pay | Admitting: Nurse Practitioner

## 2021-09-19 ENCOUNTER — Ambulatory Visit
Admission: RE | Admit: 2021-09-19 | Discharge: 2021-09-19 | Disposition: A | Discharge status: Home / Self Care | Payer: BC Managed Care – PPO | Source: Ambulatory Visit | Attending: Radiation Oncology | Admitting: Radiation Oncology

## 2021-09-19 DIAGNOSIS — C20 Malignant neoplasm of rectum: Secondary | ICD-10-CM

## 2021-09-19 NOTE — Progress Notes (Signed)
  Radiation Oncology         [phone] ________________________________  Name: Eric Lewis MRN: [id number]  Date of Service: 09/19/2021  DOB: 1971-01-25  Post Treatment Telephone Note  Diagnosis:   At least Stage IIIA, cTxN1 Adenocarcinoma of the Rectum  Interval Since Last Radiation:  5 weeks    07/12/2021 through 08/18/2021 Site Technique Total Dose (Gy) Dose per Fx (Gy) Completed Fx Beam Energies  Rectum: Rectum 3D 45/45 1.8 25/25 10X, 15X  Rectum: Rectum_Bst 3D 5.4/5.4 1.8 3/3 6X, 15X    Narrative:  The patient was contacted today for routine follow-up. During treatment he did very well with radiotherapy and did not have significant desquamation. He reports he is feeling back to normal. He has surgery scheduled at [facility] on 10/20/21..  Impression/Plan: 1. At least Stage IIIA, cTxN1 Adenocarcinoma of the Rectum. The patient has been doing well since completion of radiotherapy. We discussed that we would be happy to continue to follow him as needed, but he will also continue to follow up with Dr. Benay Spice and Dr. Ronita Hipps at [facility].     Carola Rhine, PAC

## 2021-09-20 ENCOUNTER — Other Ambulatory Visit: Payer: Self-pay | Admitting: *Deleted

## 2021-09-20 ENCOUNTER — Encounter: Payer: Self-pay | Admitting: *Deleted

## 2021-09-20 DIAGNOSIS — Z95828 Presence of other vascular implants and grafts: Secondary | ICD-10-CM

## 2021-09-20 DIAGNOSIS — C20 Malignant neoplasm of rectum: Secondary | ICD-10-CM

## 2021-09-20 NOTE — Progress Notes (Signed)
Order entered for IR CV line injection for dye study.

## 2021-09-20 NOTE — Progress Notes (Unsigned)
Patient notified that CXR didn't show any issues and that from the CXR there was nothing to explain the pain/spasm felt with the flushing of the port. Patient instructed that next time he comes in for a port flush to immediately stop the nurse if he felt that pain/spasm again. He verbalized understanding. Surgeon told patient he wanted to leave the port in a little longer.

## 2021-09-20 NOTE — Progress Notes (Unsigned)
Patient instructed that he will need to have a dye study to evaluate his port. Orders in and message sent to [facility], scheduler.

## 2021-09-23 ENCOUNTER — Other Ambulatory Visit (HOSPITAL_COMMUNITY): Payer: Self-pay | Admitting: Diagnostic Radiology

## 2021-09-23 ENCOUNTER — Ambulatory Visit (HOSPITAL_COMMUNITY)
Admission: RE | Admit: 2021-09-23 | Discharge: 2021-09-23 | Disposition: A | Discharge status: Home / Self Care | Payer: BC Managed Care – PPO | Source: Ambulatory Visit | Attending: Nurse Practitioner | Admitting: Nurse Practitioner

## 2021-09-23 ENCOUNTER — Other Ambulatory Visit: Payer: Self-pay

## 2021-09-23 DIAGNOSIS — Z95828 Presence of other vascular implants and grafts: Secondary | ICD-10-CM

## 2021-09-23 DIAGNOSIS — C20 Malignant neoplasm of rectum: Secondary | ICD-10-CM | POA: Insufficient documentation

## 2021-09-23 DIAGNOSIS — Z452 Encounter for adjustment and management of vascular access device: Secondary | ICD-10-CM | POA: Diagnosis not present

## 2021-09-23 DIAGNOSIS — T82828A Fibrosis of vascular prosthetic devices, implants and grafts, initial encounter: Secondary | ICD-10-CM | POA: Diagnosis not present

## 2021-09-23 HISTORY — PX: IR CV LINE INJECTION: IMG2294

## 2021-09-23 IMAGING — XA IR CENTRAL VENOUS CATHETER
1 series · 8 of 8 positions shown · non-contrast
Comparison: None.

INDICATION: 50-year-old with adenocarcinoma of the rectum. Patient complains of
pain in the right neck during Port-A-Cath injection.

EXAM:
PORT-A-CATH INJECTION WITH FLUOROSCOPY
TECHNIQUE: Informed written consent was obtained from the patient after a
thorough discussion of the procedural risks, benefits and
alternatives. All questions were addressed.
Right chest Port-A-Cath was accessed. Port was injected under
fluoroscopic guidance. Port was flushed with saline at the end of
the procedure.
Fluoroscopic images were taken and saved for this procedure.

[Series 1: processed: ir cv line injection · 2 acquisitions, 8 frames shown]
[im 1/2]
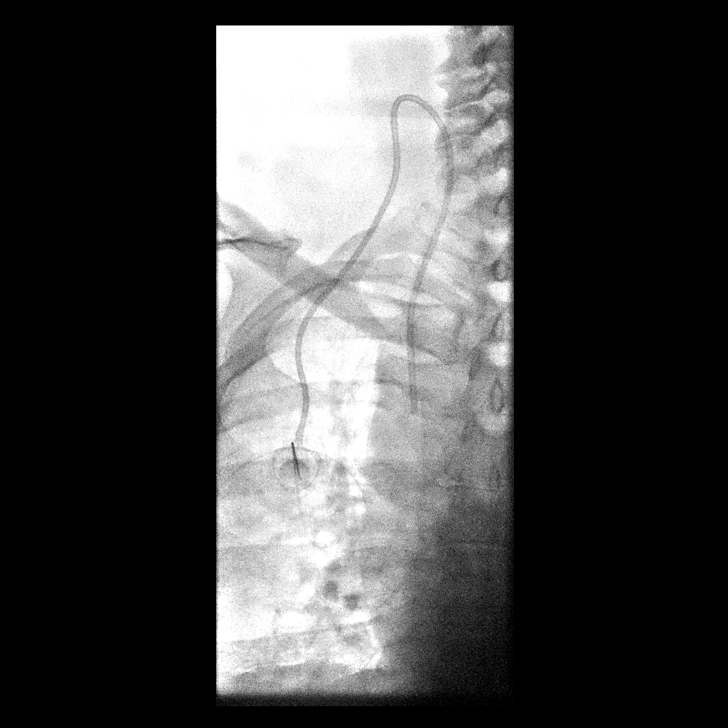
[im 1/2]
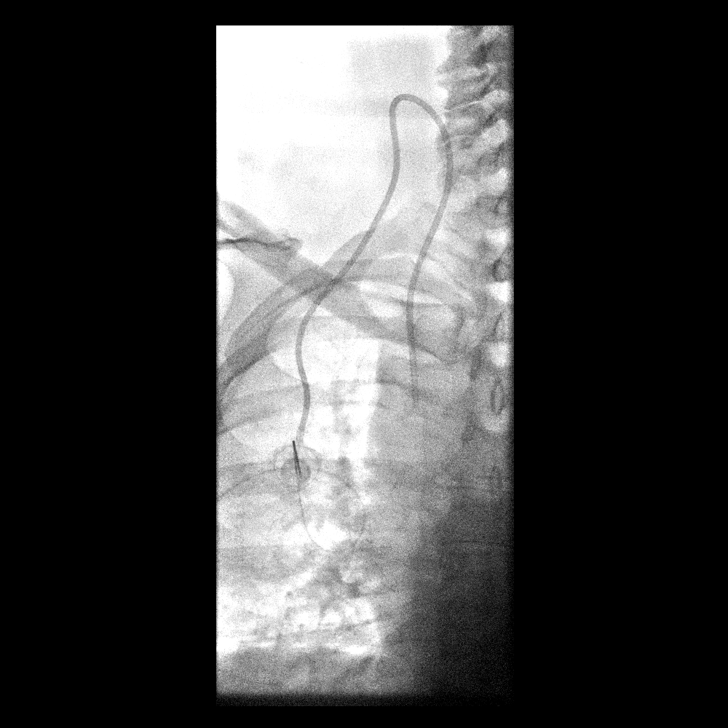
[im 1/2]
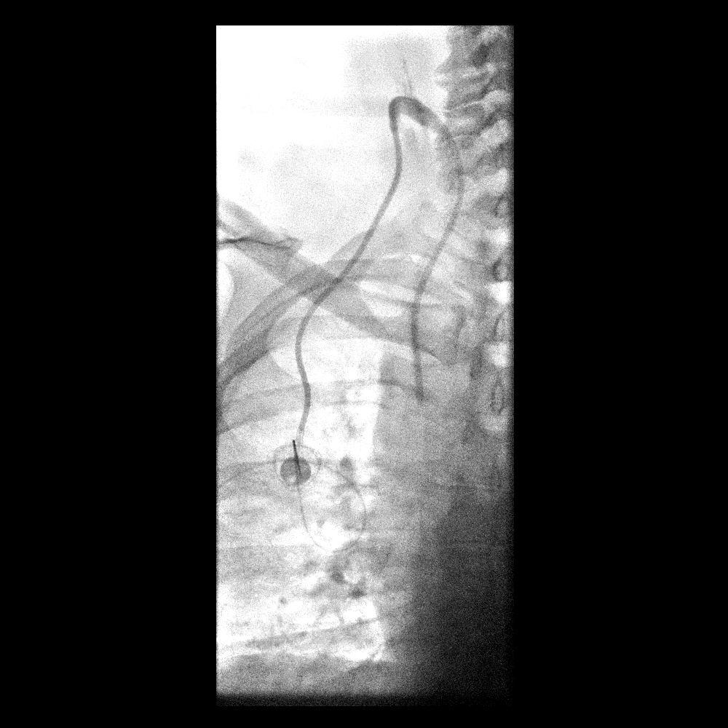
[im 1/2]
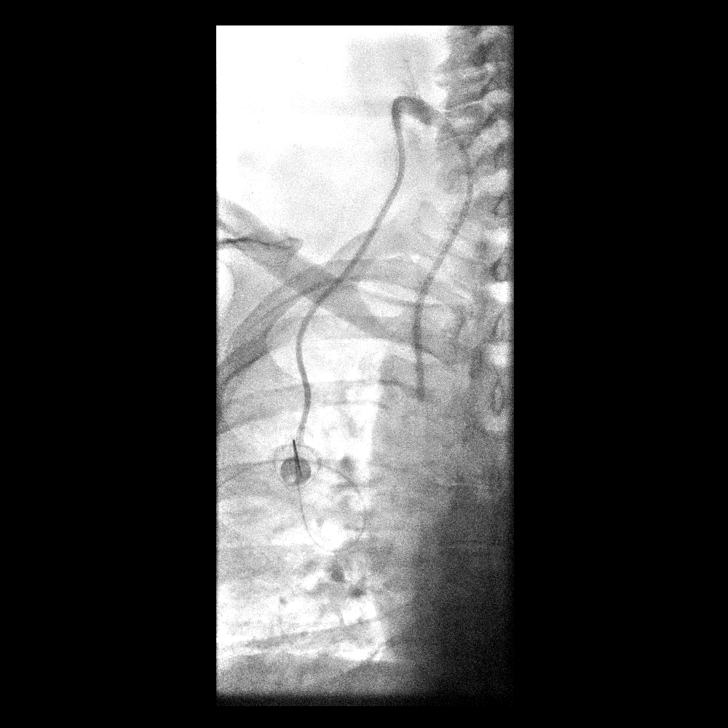
[im 2/2]
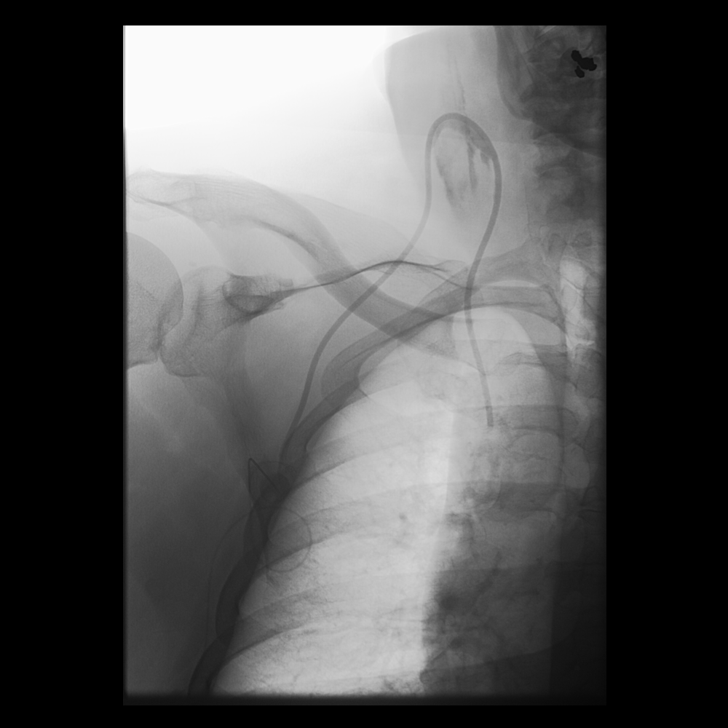
[im 2/2]
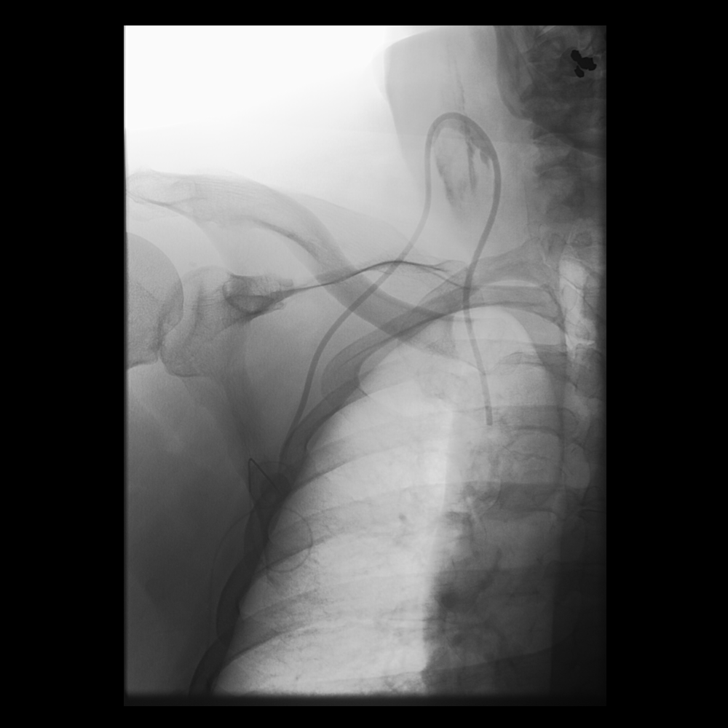
[im 2/2]
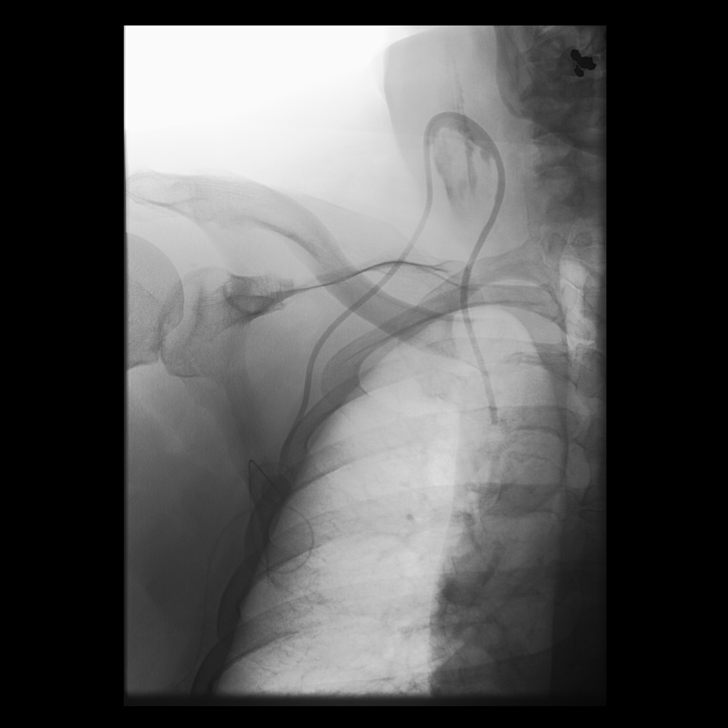
[im 2/2]
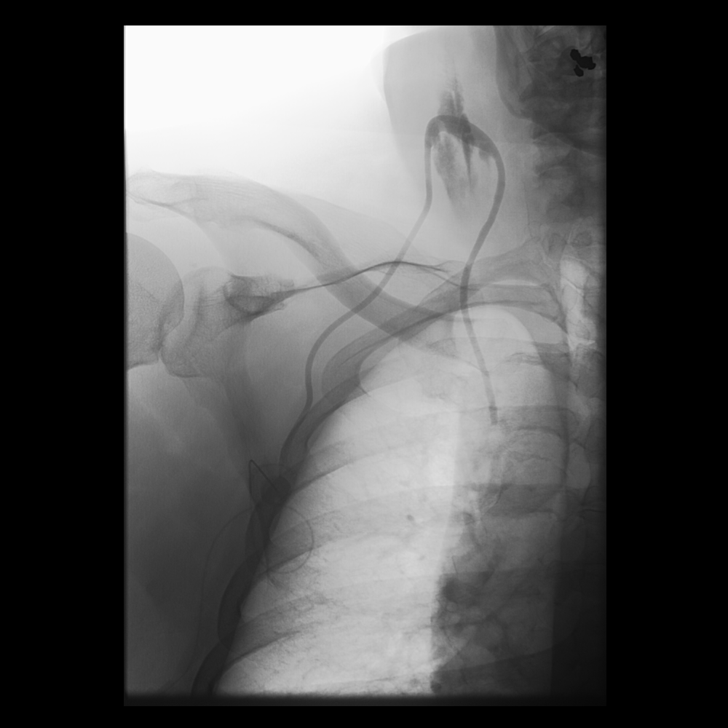

[8 of 8 positions shown; findings below may reference images not displayed]

MEDICATIONS:
None.

ANESTHESIA/SEDATION:
None

FLUOROSCOPY TIME:  Fluoroscopy Time: 42 seconds, 11 mGy

CONTRAST:  5 mL Omnipaque 300

COMPLICATIONS:
None immediate.
FINDINGS: Again noted is a right jugular Port-A-Cath. Tip of the catheter is
probably near the junction of the right innominate vein and SVC.
There is a large amount of a catheter within the right neck and
similar to the placement images from [DATE].

Extravasation of contrast in the right upper neck adjacent to the
catheter. The entire Port-A-Cath fills with contrast and suspect
there is fibrin sheath at the tip of the catheter. No significant
contrast draining into the central venous system.
IMPRESSION: 1. Large amount of contrast extravasation in the right neck adjacent
to the catheter. Based on the amount of contrast extravasation, this
is concerning for a catheter fracture. However, this extravasation
may also be associated with a very large fibrin sheath with contrast
refluxing into the extravascular space.
2. Port-A-Cath should not be used. Will schedule the patient for
port removal.

## 2021-09-23 MED ORDER — HEPARIN SOD (PORK) LOCK FLUSH 100 UNIT/ML IV SOLN
INTRAVENOUS | Status: AC
  Filled 2021-09-23: qty 5

## 2021-09-23 MED ORDER — IOHEXOL 300 MG/ML  SOLN
10.0000 mL | Freq: Once | INTRAMUSCULAR | Status: AC | PRN
  Administered 2021-09-23: 5 mL via INTRAVENOUS

## 2021-09-23 NOTE — Procedures (Addendum)
Interventional Radiology Procedure:   Indications: Pain in right neck with port injection   Procedure: Port injection and evaluation  Findings: Contrast extravasation in right neck secondary to a large fibrin sheath and reflux into extravascular space and/or catheter fracture.  Complications: No immediate complications noted.     EBL: Minimal  Plan: Port should not be used again.  Will schedule for port removal.    Jerzie Bieri R. Anselm Pancoast, MD  Pager: [phone]

## 2021-09-27 ENCOUNTER — Other Ambulatory Visit: Payer: Self-pay | Admitting: Radiology

## 2021-09-30 ENCOUNTER — Other Ambulatory Visit: Payer: Self-pay

## 2021-09-30 ENCOUNTER — Ambulatory Visit (HOSPITAL_COMMUNITY)
Admission: RE | Admit: 2021-09-30 | Discharge: 2021-09-30 | Disposition: A | Discharge status: Home / Self Care | Payer: BC Managed Care – PPO | Source: Ambulatory Visit | Attending: Diagnostic Radiology | Admitting: Diagnostic Radiology

## 2021-09-30 ENCOUNTER — Ambulatory Visit (HOSPITAL_COMMUNITY): Payer: BC Managed Care – PPO

## 2021-09-30 ENCOUNTER — Encounter (HOSPITAL_COMMUNITY): Payer: Self-pay

## 2021-09-30 DIAGNOSIS — E7800 Pure hypercholesterolemia, unspecified: Secondary | ICD-10-CM | POA: Diagnosis not present

## 2021-09-30 DIAGNOSIS — C20 Malignant neoplasm of rectum: Secondary | ICD-10-CM | POA: Insufficient documentation

## 2021-09-30 DIAGNOSIS — Z452 Encounter for adjustment and management of vascular access device: Secondary | ICD-10-CM | POA: Insufficient documentation

## 2021-09-30 DIAGNOSIS — E119 Type 2 diabetes mellitus without complications: Secondary | ICD-10-CM | POA: Insufficient documentation

## 2021-09-30 DIAGNOSIS — I10 Essential (primary) hypertension: Secondary | ICD-10-CM | POA: Diagnosis not present

## 2021-09-30 DIAGNOSIS — Z95828 Presence of other vascular implants and grafts: Secondary | ICD-10-CM

## 2021-09-30 DIAGNOSIS — Z8616 Personal history of COVID-19: Secondary | ICD-10-CM | POA: Insufficient documentation

## 2021-09-30 DIAGNOSIS — T82598A Other mechanical complication of other cardiac and vascular devices and implants, initial encounter: Secondary | ICD-10-CM | POA: Diagnosis not present

## 2021-09-30 HISTORY — PX: IR REMOVAL TUN ACCESS W/ PORT W/O FL MOD SED: IMG2290

## 2021-09-30 LAB — GLUCOSE, CAPILLARY: Glucose-Capillary: 125 mg/dL — ABNORMAL HIGH (ref 70–99)

## 2021-09-30 IMAGING — XA IR REMOVAL TUNNELED CV CATH W/PORT/PUMP
1 series · 1 of 1 positions shown · non-contrast
Comparison: none

CLINICAL DATA: Rectal cancer.  Port catheter malfunction

[2d screen save: ir removal tun access w/ · non-contrast · 1 of 1 slices shown]
[im 1/1]
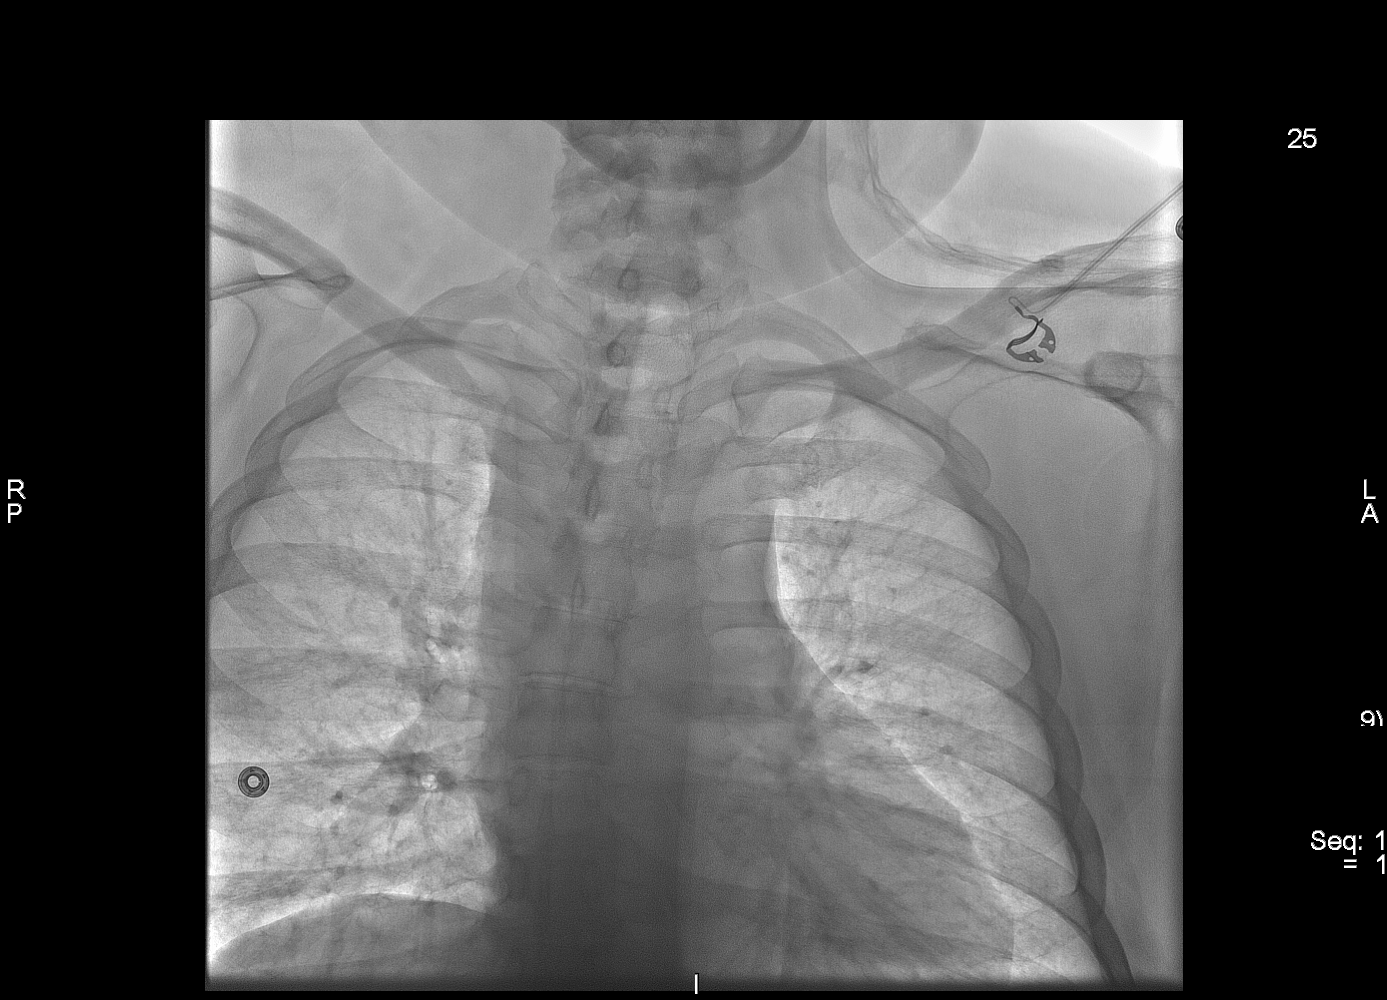

[1 of 1 positions shown; findings below may reference images not displayed]

EXAM:
REMOVAL OF IMPLANTED TUNNELED PORT-A-CATH

MEDICATIONS:
None.

ANESTHESIA/SEDATION:
Moderate (conscious) sedation was employed during this procedure. A
total of Versed 2 mg and Fentanyl 100 mcg was administered
intravenously.

Moderate Sedation Time: 18 minutes. The patient's level of
consciousness and vital signs were monitored continuously by
radiology nursing throughout the procedure under my direct
supervision.

FLUOROSCOPY TIME:  0 minutes 0 seconds (3 mGy).

PROCEDURE:
Informed written consent was obtained from the patient after a
discussion of the risk, benefits and alternatives to the procedure.
The patient was positioned supine on the fluoroscopy table and the
RIGHT chest Port-A-Cath site was prepped with chlorhexidine. A
sterile gown and gloves were worn during the procedure. Local
anesthesia was provided with 1% lidocaine with epinephrine. A
timeout was performed prior to the initiation of the procedure.

An incision was made overlying the Port-A-Cath with a #15 scalpel.
Utilizing sharp and blunt dissection, the Port-A-Cath was removed
completely. The pocked was irrigated with sterile saline. Wound
closure was performed with interrupted subcutaneous 3-0 Vicryl
sutures, then Dermabond was applied at the skin. Dressings were
applied. The patient tolerated the procedure well without immediate
post procedural complication.

A postprocedural image was obtained to ensure the catheter was
removed in its entirety.
FINDINGS: Removal of RIGHT chest Port-A-Cath without immediate post procedural
complication.
IMPRESSION: Successful removal of RIGHT chest implanted Port-A-Cath.

## 2021-09-30 MED ORDER — MIDAZOLAM HCL 2 MG/2ML IJ SOLN
INTRAMUSCULAR | Status: AC | PRN
  Administered 2021-09-30 (×2): 1 mg via INTRAVENOUS

## 2021-09-30 MED ORDER — LIDOCAINE-EPINEPHRINE (PF) 1 %-1:200000 IJ SOLN
INTRAMUSCULAR | Status: AC | PRN
  Administered 2021-09-30: 20 mL

## 2021-09-30 MED ORDER — SODIUM CHLORIDE 0.9 % IV SOLN: INTRAVENOUS | Status: DC

## 2021-09-30 MED ORDER — MIDAZOLAM HCL 2 MG/2ML IJ SOLN
INTRAMUSCULAR | Status: AC
  Filled 2021-09-30: qty 4

## 2021-09-30 MED ORDER — FENTANYL CITRATE (PF) 100 MCG/2ML IJ SOLN
INTRAMUSCULAR | Status: AC
  Filled 2021-09-30: qty 2

## 2021-09-30 MED ORDER — FENTANYL CITRATE (PF) 100 MCG/2ML IJ SOLN
INTRAMUSCULAR | Status: AC | PRN
  Administered 2021-09-30 (×2): 50 ug via INTRAVENOUS

## 2021-09-30 NOTE — H&P (Signed)
Referring Physician(s): Sherrill,B  Supervising Physician: Michaelle Birks  Patient Status:  Eric Lewis  Chief Complaint:  Right neck pain, poorly functioning right chest wall Port-A-Cath  Subjective: Patient familiar to IR service from right chest wall Port-A-Cath injection on 09/23/2021.  He has a history of rectal cancer diagnosed in April of this year (s/p chemorad) along with past medical history of psoriatic arthritis, hypertension, hypercholesterolemia, obesity, diabetes, and COVID-19 in January of this year.  He underwent right chest wall Port-A-Cath placement by [facility] on 03/18/2021.  During Port-A-Cath flush at cancer center on 09/15/2021 the patient felt pain on the right side of his neck.  Subsequent injection study on 11/18 revealed:  1. Large amount of contrast extravasation in the right neck adjacent to the catheter. Based on the amount of contrast extravasation, this is concerning for a catheter fracture. However, this extravasation may also be associated with a very large fibrin sheath with contrast refluxing into the extravascular space. 2. Port-A-Cath should not be used. Will schedule the patient for port removal   He is scheduled for colon surgery next month at [facility].  He presents today for Port-A-Cath removal.  He currently denies fever, headache, chest pain, dyspnea, cough, abdominal/back pain, nausea, vomiting or bleeding.  Past Medical History:  Diagnosis Date   COVID jan 30 th 2022   fever x 4 days all sx resolved   DM type 2 (diabetes mellitus, type 2) ([place])    Family history of breast cancer    Family history of prostate cancer    History of hypertension    no meds x 1 year due to weight loss as of 03-16-2021   Hypercholesteremia    Obesity    Psoriatic arthritis ([place])    Rectal cancer ([place])    Umbilical hernia    Past Surgical History:  Procedure Laterality Date   CHONDROPLASTY Right 09/28/2014   Procedure: CHONDROPLASTY, PATELLA/FEMORAL;  Surgeon: Alta Corning, MD;  Location: [facility];  Service: Orthopedics;  Laterality: Right;   colonscopy  03-17-2021   [facility]   IR CV LINE INJECTION  09/23/2021   KNEE ARTHROSCOPY WITH MEDIAL MENISECTOMY Right 09/28/2014   Procedure: KNEE ARTHROSCOPY WITH MEDIAL MENISECTOMY;  Surgeon: Alta Corning, MD;  Location: [place];  Service: Orthopedics;  Laterality: Right;   PORTACATH PLACEMENT Right 03/18/2021   Procedure: PLACEMENT PORT-A-CATH;  Surgeon: Ileana Roup, MD;  Location: [facility];  Service: General;  Laterality: Right;   SYNOVECTOMY Right 09/28/2014   Procedure: SYNOVECTOMY, THREE COMPARTMENT;  Surgeon: Alta Corning, MD;  Location: [place];  Service: Orthopedics;  Laterality: Right;     Allergies: Oxaliplatin and Lisinopril  Medications: Prior to Admission medications   Medication Sig Start Date End Date Taking? Authorizing Provider  Continuous Blood Gluc Sensor (FREESTYLE LIBRE 14 DAY SENSOR) MISC USE AS DIRECTED EVERY 14 DAYS 02/26/21  Yes [provider]  FARXIGA 5 MG TABS tablet Take 5 mg by mouth at bedtime. 02/16/21  Yes [provider]  loratadine (CLARITIN) 10 MG tablet Take 10 mg by mouth daily.   Yes [provider]  metFORMIN (GLUCOPHAGE) 500 MG tablet Take 2 tablets by mouth 2 (two) times daily. 10/19/20  Yes [provider]  OTEZLA 30 MG TABS Take 1 tablet by mouth 2 (two) times daily. 02/17/21  Yes [provider]  simvastatin (ZOCOR) 40 MG tablet Take 40 mg by mouth at bedtime. 02/16/21  Yes [provider]  capecitabine (XELODA) 500 MG tablet Take 4 tablets (2000mg ) by mouth in AM and 3 tablets (1500mg ) in PM. Take with food. Take Monday-Friday. Take only on days of radiation. 07/12/21   Ladell Pier, MD  Continuous Blood Gluc Receiver (FREESTYLE LIBRE 37 DAY READER) DEVI See admin instructions. Patient not taking: Reported on 04/05/2021    [provider]  ondansetron (ZOFRAN) 8 MG tablet Take 1 tablet (8 mg total) by mouth every 8 (eight) hours as needed for nausea or vomiting. Begin 72 hours after IV chemo treatment Patient not taking: Reported on 03/22/2021 03/14/21   Owens Shark, NP  prochlorperazine (COMPAZINE) 10 MG tablet Take 1 tablet (10 mg total) by mouth every 6 (six) hours as needed. Patient not taking: Reported on 03/22/2021 03/14/21   Owens Shark, NP     Vital Signs: BP 135/90   Pulse 90   Temp 98.4 F (36.9 C) (Oral)   Resp 18   SpO2 98%   Physical Exam awake, alert.  Chest clear to auscultation bilaterally.  Right chest wall port in place with portion of catheter loop in lower neck region; heart with regular rate and rhythm.  Abdomen soft, positive bowel sounds, nontender.  No lower extremity edema  Imaging: No results found.  Labs:  CBC: Recent Labs    07/12/21 0936 07/26/21 1039 08/09/21 0958 09/15/21 1427  WBC 8.6 4.7 5.1 6.4  HGB 13.1 12.9* 13.8 14.4  HCT 41.5 39.4 41.0 44.0  PLT 169 173 199 243    COAGS: No results for input(s): INR, APTT in the last 8760 hours.  BMP: Recent Labs    07/12/21 0936 07/26/21 1039 08/09/21 0958 09/15/21 1427  NA 138 137 136 139  K 4.4 3.9 4.0 4.3  CL 105 105 103 103  CO2 26 23 23 26   GLUCOSE 161* 139* 120* 112*  BUN 12 15 12 14   CALCIUM 8.7* 8.9 9.2 9.3  CREATININE 0.95 0.67 0.83 0.97  GFRNONAA >60 >60 >60 >60    LIVER FUNCTION TESTS: Recent Labs    07/12/21 0936 07/26/21 1039 08/09/21 0958 09/15/21 1427  BILITOT 0.5 0.5 0.7 0.6  AST 19 14* 17 15  ALT 24 17 19 16   ALKPHOS 72 67 67 66  PROT 6.6 6.2* 6.8 6.9  ALBUMIN 3.7 3.8 4.3 4.4    Assessment and Plan: Patient familiar to IR service from right chest wall Port-A-Cath injection on 09/23/2021.  He has a history of rectal cancer diagnosed in April of this year (s/p chemorad) along with past medical history of psoriatic arthritis, tension, hypercholesterolemia, obesity, diabetes, and  COVID-19 in January of this year.  He underwent right chest wall Port-A-Cath placement by [facility] on 03/18/2021.  During Port-A-Cath flush at cancer center on 09/15/2021 the patient felt pain on the right side of his neck.  Subsequent injection study on 11/18 revealed:  1. Large amount of contrast extravasation in the right neck adjacent to the catheter. Based on the amount of contrast extravasation, this is concerning for a catheter fracture. However, this extravasation may also be associated with a very large fibrin sheath with contrast refluxing into the extravascular space. 2. Port-A-Cath should not be used. Will schedule the patient for port removal   He is scheduled for colon surgery next month at [facility].  He presents today for Port-A-Cath removal.  Details/risks of procedure, including but not limited to, internal bleeding, infection, injury to adjacent structures discussed with patient with his understanding  and consent.   Electronically Signed: D. Rowe Robert, PA-C 09/30/2021, 9:36 AM   I spent a total of 20 minutes at the the patient's bedside AND on the patient's hospital floor or unit, greater than 50% of which was counseling/coordinating care for Port-A-Cath removal

## 2021-09-30 NOTE — Procedures (Signed)
Vascular and Interventional Radiology Procedure Note  Patient: Mani Celestin DOB: 02-15-1971 Medical Record Number: [id number] Note Date/Time: 09/30/21 11:18 AM   Performing Physician: Michaelle Birks, MD Assistant(s): None  Diagnosis:  Rectal CA. Malfunctioning R chest port.  Procedure: PORT REMOVAL  Anesthesia: Conscious Sedation Complications: None Estimated Blood Loss: Minimal Specimens:  None  Findings:  Successful removal of a right chest venous port. Primary incision closure. Dermabond at skin.  See detailed procedure note with images in PACS. The patient tolerated the procedure well without incident or complication and was returned to Short Stay in stable condition.    Michaelle Birks, MD [facility] Inter[facility]   Pager. [phone]

## 2021-10-06 ENCOUNTER — Encounter: Payer: Self-pay | Admitting: Oncology

## 2021-10-06 ENCOUNTER — Telehealth: Payer: Self-pay

## 2021-10-06 DIAGNOSIS — E119 Type 2 diabetes mellitus without complications: Secondary | ICD-10-CM | POA: Diagnosis not present

## 2021-10-06 DIAGNOSIS — Z7189 Other specified counseling: Secondary | ICD-10-CM | POA: Diagnosis not present

## 2021-10-06 DIAGNOSIS — Z6837 Body mass index (BMI) 37.0-37.9, adult: Secondary | ICD-10-CM | POA: Diagnosis not present

## 2021-10-06 DIAGNOSIS — L4050 Arthropathic psoriasis, unspecified: Secondary | ICD-10-CM | POA: Diagnosis not present

## 2021-10-06 DIAGNOSIS — Z01818 Encounter for other preprocedural examination: Secondary | ICD-10-CM | POA: Diagnosis not present

## 2021-10-06 DIAGNOSIS — Z9221 Personal history of antineoplastic chemotherapy: Secondary | ICD-10-CM | POA: Diagnosis not present

## 2021-10-06 DIAGNOSIS — C20 Malignant neoplasm of rectum: Secondary | ICD-10-CM | POA: Diagnosis not present

## 2021-10-06 DIAGNOSIS — E669 Obesity, unspecified: Secondary | ICD-10-CM | POA: Diagnosis not present

## 2021-10-06 DIAGNOSIS — Z8616 Personal history of COVID-19: Secondary | ICD-10-CM | POA: Diagnosis not present

## 2021-10-06 NOTE — Telephone Encounter (Signed)
Canceled port flush appointment. Pt states his port was removed.

## 2021-10-13 ENCOUNTER — Inpatient Hospital Stay: Payer: BC Managed Care – PPO | Attending: Oncology | Admitting: Oncology

## 2021-10-13 ENCOUNTER — Other Ambulatory Visit: Payer: Self-pay

## 2021-10-13 ENCOUNTER — Inpatient Hospital Stay: Payer: BC Managed Care – PPO

## 2021-10-13 VITALS — BP 123/87 | HR 85 | Temp 97.9°F | Resp 18 | Wt 239.8 lb

## 2021-10-13 DIAGNOSIS — L4050 Arthropathic psoriasis, unspecified: Secondary | ICD-10-CM | POA: Insufficient documentation

## 2021-10-13 DIAGNOSIS — C20 Malignant neoplasm of rectum: Secondary | ICD-10-CM | POA: Diagnosis not present

## 2021-10-13 DIAGNOSIS — E119 Type 2 diabetes mellitus without complications: Secondary | ICD-10-CM | POA: Diagnosis not present

## 2021-10-13 DIAGNOSIS — Z8616 Personal history of COVID-19: Secondary | ICD-10-CM | POA: Insufficient documentation

## 2021-10-13 NOTE — Progress Notes (Signed)
  [place] OFFICE PROGRESS NOTE   Diagnosis: Rectal cancer  INTERVAL HISTORY:   Eric Lewis returns as scheduled.  He developed neck discomfort during a Port-A-Cath flush last month.  A dye study revealed extravasation of contrast in the right upper neck adjacent to the catheter.  The Port-A-Cath was removed 09/30/2021.  No recurrent neck discomfort. He is scheduled for surgery at [facility] on 10/20/2021.  He does not have significant neuropathy symptoms.    Objective:  Vital signs in last 24 hours:  Blood pressure 123/87, pulse 85, temperature 97.9 F (36.6 C), temperature source Oral, resp. rate 18, weight 239 lb 12.8 oz (108.8 kg), SpO2 100 %.    HEENT: Neck without mass Resp: Lungs clear bilaterally Cardio: Regular rate and rhythm GI: No hepatosplenomegaly, nontender, no mass, reducible umbilical hernia Vascular: No leg edema    Lab Results:  Lab Results  Component Value Date   WBC 6.4 09/15/2021   HGB 14.4 09/15/2021   HCT 44.0 09/15/2021   MCV 94.2 09/15/2021   PLT 243 09/15/2021   NEUTROABS 4.2 09/15/2021    CMP  Lab Results  Component Value Date   NA 139 09/15/2021   K 4.3 09/15/2021   CL 103 09/15/2021   CO2 26 09/15/2021   GLUCOSE 112 (H) 09/15/2021   BUN 14 09/15/2021   CREATININE 0.97 09/15/2021   CALCIUM 9.3 09/15/2021   PROT 6.9 09/15/2021   ALBUMIN 4.4 09/15/2021   AST 15 09/15/2021   ALT 16 09/15/2021   ALKPHOS 66 09/15/2021   BILITOT 0.6 09/15/2021   GFRNONAA >60 09/15/2021   GFRAA >60 05/30/2016    Lab Results  Component Value Date   CEA 3.01 09/15/2021    Medications: I have reviewed the patient's current medications.   Assessment/Plan: Rectal cancer Colonoscopy 02/15/2021- nonobstructing mass at 2-3 cm in the anal verge, removed in a piecemeal fashion, pathology confirmed moderately differentiated adenocarcinoma CTs chest, abdomen, and pelvis on 02/17/2021- no evidence of metastatic disease, no clear mass  identified, areas of mild rectal wall thickening MRI 03/02/2021- rectal tumor not identified, no extension beyond the muscularis identified.  N1 disease with multiple left mesorectal nodes, and a 7 mm node beyond the mesorectum at the hypogastric region Cycle 1 FOLFOX 03/22/2021 Cycle 2 FOLFOX 04/05/2021 Cycle 3 FOLFOX 04/19/2021 Cycle 4 FOLFOX 05/03/2021 Cycle 5 FOLFOX 05/17/2021 Cycle 6 FOLFOX 05/31/2021 Cycle 7 FOLFOX 06/14/2021-Oxaliplatin infusion time lengthened, Benadryl and Pepcid added to premedications due to coughing, sneezing, throat clearing, diaphoresis during Oxaliplatin cycle 6 Cycle 8 FOLFOX 06/28/2021 Radiation/Xeloda 07/12/2021-08/18/2021 MRI pelvis 08/25/2021 at [place] rectal tumor not definitely visualized with persistent irregular anterior rectal wall thickening and redemonstrated irregularity of the muscularis propria.  Decreased size of left mesorectal and left internal iliac lymph nodes.   History of dysphagia-status post a barium swallow 10/22/2020- mild smooth stricture at the GE junction Psoriatic arthritis Diabetes COVID-19 January 22 Coughing, sneezing, throat clearing, diaphoresis during Oxaliplatin infusion cycle 6 FOLFOX Anaphylactic reaction to oxaliplatin 06/28/2021 Removal of malfunctioning Port-A-Cath 09/30/2021    Disposition: Eric Lewis completed total neoadjuvant therapy for treatment of clinical stage III rectal cancer.  He underwent Port-A-Cath removal after a suspected catheter fracture.  He is scheduled for surgery at [facility] on 10/20/2021.  He will return for an office visit here approximately 4 weeks following surgery.  Betsy Coder, MD  10/13/2021  3:16 PM

## 2021-10-14 DIAGNOSIS — E119 Type 2 diabetes mellitus without complications: Secondary | ICD-10-CM | POA: Diagnosis not present

## 2021-10-14 DIAGNOSIS — C20 Malignant neoplasm of rectum: Secondary | ICD-10-CM | POA: Diagnosis not present

## 2021-10-14 DIAGNOSIS — Z0001 Encounter for general adult medical examination with abnormal findings: Secondary | ICD-10-CM | POA: Diagnosis not present

## 2021-10-14 DIAGNOSIS — E7800 Pure hypercholesterolemia, unspecified: Secondary | ICD-10-CM | POA: Diagnosis not present

## 2021-10-14 DIAGNOSIS — I10 Essential (primary) hypertension: Secondary | ICD-10-CM | POA: Diagnosis not present

## 2021-10-14 DIAGNOSIS — Z125 Encounter for screening for malignant neoplasm of prostate: Secondary | ICD-10-CM | POA: Diagnosis not present

## 2021-10-18 DIAGNOSIS — C20 Malignant neoplasm of rectum: Secondary | ICD-10-CM | POA: Diagnosis not present

## 2021-10-18 DIAGNOSIS — G8918 Other acute postprocedural pain: Secondary | ICD-10-CM | POA: Diagnosis not present

## 2021-10-18 DIAGNOSIS — Z20822 Contact with and (suspected) exposure to covid-19: Secondary | ICD-10-CM | POA: Diagnosis not present

## 2021-10-18 DIAGNOSIS — R600 Localized edema: Secondary | ICD-10-CM | POA: Diagnosis not present

## 2021-10-18 DIAGNOSIS — E119 Type 2 diabetes mellitus without complications: Secondary | ICD-10-CM | POA: Diagnosis not present

## 2021-10-18 DIAGNOSIS — Z79899 Other long term (current) drug therapy: Secondary | ICD-10-CM | POA: Diagnosis not present

## 2021-10-18 DIAGNOSIS — Z7984 Long term (current) use of oral hypoglycemic drugs: Secondary | ICD-10-CM | POA: Diagnosis not present

## 2021-10-18 DIAGNOSIS — Z6837 Body mass index (BMI) 37.0-37.9, adult: Secondary | ICD-10-CM | POA: Diagnosis not present

## 2021-10-18 DIAGNOSIS — Z8616 Personal history of COVID-19: Secondary | ICD-10-CM | POA: Diagnosis not present

## 2021-10-18 DIAGNOSIS — Z888 Allergy status to other drugs, medicaments and biological substances status: Secondary | ICD-10-CM | POA: Diagnosis not present

## 2021-10-18 DIAGNOSIS — Z923 Personal history of irradiation: Secondary | ICD-10-CM | POA: Diagnosis not present

## 2021-10-18 DIAGNOSIS — K660 Peritoneal adhesions (postprocedural) (postinfection): Secondary | ICD-10-CM | POA: Diagnosis not present

## 2021-10-18 DIAGNOSIS — E669 Obesity, unspecified: Secondary | ICD-10-CM | POA: Diagnosis not present

## 2021-10-18 DIAGNOSIS — L4050 Arthropathic psoriasis, unspecified: Secondary | ICD-10-CM | POA: Diagnosis not present

## 2021-10-18 DIAGNOSIS — Z87892 Personal history of anaphylaxis: Secondary | ICD-10-CM | POA: Diagnosis not present

## 2021-10-18 DIAGNOSIS — M79605 Pain in left leg: Secondary | ICD-10-CM | POA: Diagnosis not present

## 2021-10-18 DIAGNOSIS — Z9221 Personal history of antineoplastic chemotherapy: Secondary | ICD-10-CM | POA: Diagnosis not present

## 2021-10-20 HISTORY — PX: OTHER SURGICAL HISTORY: SHX169

## 2021-11-02 DIAGNOSIS — Z9889 Other specified postprocedural states: Secondary | ICD-10-CM | POA: Diagnosis not present

## 2021-11-02 DIAGNOSIS — M79662 Pain in left lower leg: Secondary | ICD-10-CM | POA: Diagnosis not present

## 2021-11-02 DIAGNOSIS — M7989 Other specified soft tissue disorders: Secondary | ICD-10-CM | POA: Diagnosis not present

## 2021-11-02 DIAGNOSIS — Z932 Ileostomy status: Secondary | ICD-10-CM | POA: Diagnosis not present

## 2021-11-14 ENCOUNTER — Telehealth: Payer: Self-pay

## 2021-11-14 NOTE — Telephone Encounter (Signed)
Pt called stated he had surgery and during his surgery his leg was hanging and he received some damage from his leg hanging. [facility] referred him to a neurologist and he stated he has an appointment on 1/31/2. Per Dr Benay Spice Pt should stay with his appointment with Neurologist from [facility]. Pt verbalized understanding.

## 2021-11-15 DIAGNOSIS — R2242 Localized swelling, mass and lump, left lower limb: Secondary | ICD-10-CM | POA: Diagnosis not present

## 2021-11-16 DIAGNOSIS — R2242 Localized swelling, mass and lump, left lower limb: Secondary | ICD-10-CM | POA: Diagnosis not present

## 2021-11-17 DIAGNOSIS — M79662 Pain in left lower leg: Secondary | ICD-10-CM | POA: Diagnosis not present

## 2021-11-17 DIAGNOSIS — Z932 Ileostomy status: Secondary | ICD-10-CM | POA: Diagnosis not present

## 2021-11-17 DIAGNOSIS — M7989 Other specified soft tissue disorders: Secondary | ICD-10-CM | POA: Diagnosis not present

## 2021-11-18 DIAGNOSIS — M6289 Other specified disorders of muscle: Secondary | ICD-10-CM | POA: Diagnosis not present

## 2021-11-22 ENCOUNTER — Other Ambulatory Visit: Payer: Self-pay

## 2021-11-22 ENCOUNTER — Encounter: Payer: Self-pay | Admitting: Oncology

## 2021-11-22 ENCOUNTER — Inpatient Hospital Stay: Payer: BC Managed Care – PPO | Attending: Oncology | Admitting: Oncology

## 2021-11-22 VITALS — BP 140/98 | HR 120 | Temp 97.8°F | Resp 20 | Ht 67.0 in | Wt 219.0 lb

## 2021-11-22 DIAGNOSIS — Z8616 Personal history of COVID-19: Secondary | ICD-10-CM | POA: Insufficient documentation

## 2021-11-22 DIAGNOSIS — M79605 Pain in left leg: Secondary | ICD-10-CM | POA: Insufficient documentation

## 2021-11-22 DIAGNOSIS — C20 Malignant neoplasm of rectum: Secondary | ICD-10-CM | POA: Insufficient documentation

## 2021-11-22 DIAGNOSIS — R000 Tachycardia, unspecified: Secondary | ICD-10-CM | POA: Diagnosis not present

## 2021-11-22 MED ORDER — OXYCODONE HCL 5 MG PO TABS: 5.0000 mg | ORAL_TABLET | Freq: Every day | ORAL | 0 refills | Status: DC | PRN

## 2021-11-22 NOTE — Progress Notes (Signed)
[place] OFFICE PROGRESS NOTE   Diagnosis: Rectal cancer  INTERVAL HISTORY:   Mr Zinni underwent a robotic assisted low anterior resection and diverting loop ileostomy by Dr. Jonni Sanger on 10/20/2021.  He developed edema in the left calf with paresthesias following surgery.  An ultrasound was negative for DVT. He was discharged to home 10/25/2021.  Left leg swelling has resolved, but there is a persistent "knot "at the posterior medial left calf.  He has pain in the left foot, chiefly at night.  Tramadol does not relieve the pain.  He was prescribed oxycodone upon discharge from the hospital.  This helps the pain.  He has numbness at the sole of the left foot.  He has difficulty ambulating secondary to pain. He was referred to [facility] for an MRI of the left tibial/fibula on 11/16/2021.  The MRI revealed evidence of myonecrosis in the deep posterior compartment of the left lower leg, soleus to the level of the Achilles.  No focal fluid collection. The CPK was elevated while he was admitted at [facility].  On 11/18/2021 the CPK returned at [place].  The pathology from the low anterior resection revealed puckered tan mucosa at the site of a stitch.  Tumor did not appear to invade past the mucosa.  No evidence of residual tumor was found microscopically.  Submucosal fibrosis and treatment effect, 24 negative lymph nodes.ypT0,ypN0.  The ileostomy is functioning.  The stool consistency is like "sludge ".  He empties the ostomy 3-4 times per day.  He reports adequate dietary intake.   Objective:  Vital signs in last 24 hours:  Blood pressure (!) 150/100, pulse 100, temperature 97.8 F (36.6 C), temperature source Oral, resp. rate 20, height 5\' 7"  (1.702 m), weight 219 lb (99.3 kg), SpO2 98 %.    HEENT: No thrush, mucous membranes are moist Resp: Lungs clear bilaterally Cardio: Regular rate and rhythm, tachycardia GI: Healed surgical incisions, nontender, soft Vascular: No  leg edema Neuro: Numbness to light touch at the sole of the left foot, decreased strength with dorsi flexion at the left foot Musculoskeletal: Firm fullness at the low posterior medial aspect of the left calf.  No palpable cord.  No erythema of the left leg.   Lab Results:  Lab Results  Component Value Date   WBC 6.4 09/15/2021   HGB 14.4 09/15/2021   HCT 44.0 09/15/2021   MCV 94.2 09/15/2021   PLT 243 09/15/2021   NEUTROABS 4.2 09/15/2021    CMP  Lab Results  Component Value Date   NA 139 09/15/2021   K 4.3 09/15/2021   CL 103 09/15/2021   CO2 26 09/15/2021   GLUCOSE 112 (H) 09/15/2021   BUN 14 09/15/2021   CREATININE 0.97 09/15/2021   CALCIUM 9.3 09/15/2021   PROT 6.9 09/15/2021   ALBUMIN 4.4 09/15/2021   AST 15 09/15/2021   ALT 16 09/15/2021   ALKPHOS 66 09/15/2021   BILITOT 0.6 09/15/2021   GFRNONAA >60 09/15/2021   GFRAA >60 05/30/2016    Lab Results  Component Value Date   CEA 3.01 09/15/2021     Medications: I have reviewed the patient's current medications.   Assessment/Plan: Rectal cancer Colonoscopy 02/15/2021- nonobstructing mass at 2-3 cm in the anal verge, removed in a piecemeal fashion, pathology confirmed moderately differentiated adenocarcinoma CTs chest, abdomen, and pelvis on 02/17/2021- no evidence of metastatic disease, no clear mass identified, areas of mild rectal wall thickening MRI 03/02/2021- rectal tumor not identified, no extension  beyond the muscularis identified.  N1 disease with multiple left mesorectal nodes, and a 7 mm node beyond the mesorectum at the hypogastric region Cycle 1 FOLFOX 03/22/2021 Cycle 2 FOLFOX 04/05/2021 Cycle 3 FOLFOX 04/19/2021 Cycle 4 FOLFOX 05/03/2021 Cycle 5 FOLFOX 05/17/2021 Cycle 6 FOLFOX 05/31/2021 Cycle 7 FOLFOX 06/14/2021-Oxaliplatin infusion time lengthened, Benadryl and Pepcid added to premedications due to coughing, sneezing, throat clearing, diaphoresis during Oxaliplatin cycle 6 Cycle 8 FOLFOX  06/28/2021 Radiation/Xeloda 07/12/2021-08/18/2021 MRI pelvis 08/25/2021 at [place] rectal tumor not definitely visualized with persistent irregular anterior rectal wall thickening and redemonstrated irregularity of the muscularis propria.  Decreased size of left mesorectal and left internal iliac lymph nodes. Low anterior resection and diverting loop ileostomy 10/20/2021,ypT0,ypN0, submucosal fibrosis and treatment effect without evidence of residual tumor   History of dysphagia-status post a barium swallow 10/22/2020- mild smooth stricture at the GE junction Psoriatic arthritis Diabetes COVID-19 January 22 Coughing, sneezing, throat clearing, diaphoresis during Oxaliplatin infusion cycle 6 FOLFOX Anaphylactic reaction to oxaliplatin 06/28/2021 Removal of malfunctioning Port-A-Cath 09/30/2021 Left calf muscle injury during surgery 10/20/2021-MRI of the left tibia/fibula-myonecrosis in the posterior muscle compartments      Disposition: Mr Clemence underwent a low anterior resection and diverting loop ileostomy 10/20/2021.  No residual tumor was found.  There is no indication for adjuvant therapy.  He will follow-up with Dr. Jonni Sanger for ileostomy reversal.  He developed injury of the left calf musculature during surgery.  He has persistent pain and numbness of the left lower extremity.  Repeat Doppler evaluations at [facility] were negative for DVT.  I refilled his prescription for oxycodone to use as needed for pain.  He is scheduled to see Dr. Dorthy Cooler tomorrow.  He may benefit from a neurology or orthopedic referral.  Mr. Quintela will return for an office visit after the ileostomy takedown.  I am available to see him sooner as needed.  He had tachycardia on exam today.  I have a low clinical suspicion for a primary cardiopulmonary process.  He has been noted to have tachycardia on office visits here in the past.  Betsy Coder, MD  11/22/2021  3:27 PM

## 2021-11-23 DIAGNOSIS — R29898 Other symptoms and signs involving the musculoskeletal system: Secondary | ICD-10-CM | POA: Diagnosis not present

## 2021-11-23 DIAGNOSIS — R2242 Localized swelling, mass and lump, left lower limb: Secondary | ICD-10-CM | POA: Diagnosis not present

## 2021-11-23 DIAGNOSIS — G5792 Unspecified mononeuropathy of left lower limb: Secondary | ICD-10-CM | POA: Diagnosis not present

## 2021-11-23 NOTE — Addendum Note (Signed)
Addended by: Lenox Ponds E on: 11/23/2021 10:27 AM   Modules accepted: Orders

## 2021-11-24 DIAGNOSIS — C20 Malignant neoplasm of rectum: Secondary | ICD-10-CM | POA: Diagnosis not present

## 2021-11-24 DIAGNOSIS — Z932 Ileostomy status: Secondary | ICD-10-CM | POA: Diagnosis not present

## 2021-11-25 ENCOUNTER — Encounter: Payer: Self-pay | Admitting: Neurology

## 2021-11-29 DIAGNOSIS — M6702 Short Achilles tendon (acquired), left ankle: Secondary | ICD-10-CM | POA: Diagnosis not present

## 2021-11-29 DIAGNOSIS — M25572 Pain in left ankle and joints of left foot: Secondary | ICD-10-CM | POA: Diagnosis not present

## 2021-11-29 DIAGNOSIS — M79672 Pain in left foot: Secondary | ICD-10-CM | POA: Diagnosis not present

## 2021-12-01 NOTE — Progress Notes (Signed)
[facility] Clinic Note - Initial Visit   Date: 12/02/21  Eric Lewis MRN: [id number] DOB: 09-04-1971   Dear Dr. Dorthy Cooler:  Thank you for your kind referral of Eric Lewis for consultation of left foot pain. Although his history is well known to you, please allow [place] to reiterate it for the purpose of our medical record. The patient was accompanied to the clinic by friend Jana Half) who also provides collateral information.     History of Present Illness: Eric Lewis is a 51 y.o. right-handed male with diabetes mellitus, colorectal cancer s/p resection (10/2021), and hyperlipidemia presenting for evaluation of left leg weakness. He underwent robotic associated low antioer resection and diverting loop ileostomy for adenocarcinoma of the rectum.  Per op reports, he was positioned in the supine position for surgery.  Due to body habitus, rectal immobilization was challenging. Following his surgery, he recalls having left leg numbness and swelling.  The swelling persisted for about a month and slowly improved. [place] left leg was negative for DVT.  The swelling subsided about two weeks ago, and then he noticed at tight knot over the calf muscle, in addition to sharp left foot pain and tightness over the ball of the foot.  He occasionally has tingling. He is using a walker since surgery. He complains of weakness in the foot.   He takes gabapentin 900mg  three times daily and was started on amitriptyline 25mg  at bedtime earlier this week. He saw [facility] who recommended physical therapy to stretch the left leg.  MRI left tibia shows posterior muscle with myonecrosis. He denies any problems in the right leg.   Out-side paper records, electronic medical record, and images have been reviewed where available and summarized as:  MRI left tibia 11/28/2021: 1.  Findings described above within the posterior muscle compartments of the leg are consistent with  myonecrosis.  2.  No drainable fluid collection.  3.  No abnormal marrow signal.  Lab Results  Component Value Date   ESRSEDRATE 28 (H) 10/14/2014    Past Medical History:  Diagnosis Date   COVID jan 30 th 2022   fever x 4 days all sx resolved   DM type 2 (diabetes mellitus, type 2) ([place])    Family history of breast cancer    Family history of prostate cancer    History of hypertension    no meds x 1 year due to weight loss as of 03-16-2021   Hypercholesteremia    Obesity    Psoriatic arthritis ([place])    Rectal cancer ([place])    Umbilical hernia     Past Surgical History:  Procedure Laterality Date   CHONDROPLASTY Right 09/28/2014   Procedure: CHONDROPLASTY, PATELLA/FEMORAL;  Surgeon: Alta Corning, MD;  Location: [place];  Service: Orthopedics;  Laterality: Right;   colonscopy  03-17-2021   [facility]   IR CV LINE INJECTION  09/23/2021   IR REMOVAL TUN ACCESS W/ PORT W/O FL MOD SED  09/30/2021   KNEE ARTHROSCOPY WITH MEDIAL MENISECTOMY Right 09/28/2014   Procedure: KNEE ARTHROSCOPY WITH MEDIAL MENISECTOMY;  Surgeon: Alta Corning, MD;  Location: [place];  Service: Orthopedics;  Laterality: Right;   PORTACATH PLACEMENT Right 03/18/2021   Procedure: PLACEMENT PORT-A-CATH;  Surgeon: Ileana Roup, MD;  Location: [facility];  Service: General;  Laterality: Right;   SYNOVECTOMY Right 09/28/2014   Procedure: SYNOVECTOMY, THREE COMPARTMENT;  Surgeon: Alta Corning, MD;  Location: [facility];  Service: Orthopedics;  Laterality: Right;     Medications:  Outpatient Encounter Medications as of 12/02/2021  Medication Sig Note   Continuous Blood Gluc Sensor (FREESTYLE LIBRE 14 DAY SENSOR) MISC USE AS DIRECTED EVERY 14 DAYS 09/30/2021: Due to be changed today   FARXIGA 5 MG TABS tablet Take 5 mg by mouth at bedtime.    gabapentin (NEURONTIN) 300 MG capsule Take three capsules 3 times daily    metFORMIN (GLUCOPHAGE) 500  MG tablet Take 2 tablets by mouth 2 (two) times daily.    OTEZLA 30 MG TABS Take 1 tablet by mouth 2 (two) times daily.    oxyCODONE (OXY IR/ROXICODONE) 5 MG immediate release tablet Take 1-2 tablets (5-10 mg total) by mouth daily as needed for severe pain.    simvastatin (ZOCOR) 40 MG tablet Take 40 mg by mouth at bedtime.    Continuous Blood Gluc Receiver (FREESTYLE LIBRE 14 DAY READER) DEVI See admin instructions. (Patient not taking: Reported on 04/05/2021)    loratadine (CLARITIN) 10 MG tablet Take 10 mg by mouth daily. (Patient not taking: Reported on 11/22/2021)    ondansetron (ZOFRAN) 8 MG tablet Take 1 tablet (8 mg total) by mouth every 8 (eight) hours as needed for nausea or vomiting. Begin 72 hours after IV chemo treatment (Patient not taking: Reported on 03/22/2021) 03/18/2021: Has not picked up yet   prochlorperazine (COMPAZINE) 10 MG tablet Take 1 tablet (10 mg total) by mouth every 6 (six) hours as needed. (Patient not taking: Reported on 03/22/2021) 03/18/2021: Has not picked up yet   Facility-Administered Encounter Medications as of 12/02/2021  Medication   heparin lock flush 100 unit/mL   sodium chloride flush (NS) 0.9 % injection 10 mL    Allergies:  Allergies  Allergen Reactions   Oxaliplatin Nausea Only, Rash and Cough    Patient had Hypersensitivity reaction to Oxaliplatin. See progress note on 06/28/2021.  Clearing of the throat, slightly wet cough, perspiration, eyes watering & red, face, neck & hands red, rash to back, slightly swollen hands.   Lisinopril Other (See Comments)    Patient is unsure of this allergy    Family History: Family History  Problem Relation Age of Onset   Emphysema Maternal Grandmother    Breast cancer Paternal Grandmother        dx 52s   Prostate cancer Paternal Uncle 69    Social History: Social History   Tobacco Use   Smoking status: Never   Smokeless tobacco: Never  Vaping Use   Vaping Use: Never used  Substance Use Topics    Alcohol use: No    Alcohol/week: 0.0 standard drinks   Drug use: No   Social History   Social History Narrative   Reports he is the middle child of two other siblings. 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      Right Handed    Lives in a two story home     Vital Signs:  BP 132/83    Pulse (!) 117    Ht 5\' 7"  (1.702 m)    Wt 215 lb (97.5 kg)    SpO2 98%    BMI 33.67 kg/m   Neurological Exam: MENTAL STATUS including orientation to time, place, person, recent and remote memory, attention span and concentration, language, and fund of knowledge is normal.  Speech is not dysarthric.  CRANIAL NERVES: II:  No visual field defects.   III-IV-VI: Pupils equal round and reactive to  light.  Normal conjugate, extra-ocular eye movements in all directions of gaze.  No nystagmus.  No ptosis.   V:  Normal facial sensation.    VII:  Normal facial symmetry and movements.   VIII:  Normal hearing and vestibular function.   IX-X:  Normal palatal movement.   XI:  Normal shoulder shrug and head rotation.   XII:  Normal tongue strength and range of motion, no deviation or fasciculation.  MOTOR:  Left leg atrophy,compared to the right. There is tight muscle contracture of the left gastrocnemius muscle, limited left dorsiflexion. No fasciculations or abnormal movements.  No pronator drift.   Upper Extremity:  Right  Left  Deltoid  5/5   5/5   Biceps  5/5   5/5   Triceps  5/5   5/5   Infraspinatus 5/5  5/5  Medial pectoralis 5/5  5/5  Wrist extensors  5/5   5/5   Wrist flexors  5/5   5/5   Finger extensors  5/5   5/5   Finger flexors  5/5   5/5   Dorsal interossei  5/5   5/5   Abductor pollicis  5/5   5/5   Tone (Ashworth scale)  0  0   Lower Extremity:  Right  Left  Hip flexors  5/5   5/5   Hip extensors  5/5   5/5   Adductor 5/5  5/5  Abductor 5/5  5/5  Knee flexors  5/5   5-/5   Knee extensors  5/5   5/5   Dorsiflexors  5/5   5/5   Plantarflexors  5/5   4/5   Toe extensors  5/5    5/5   Toe flexors  5/5   4/5   Tone (Ashworth scale)  0  0   MSRs:  Right        Left                  brachioradialis 2+  2+  biceps 2+  2+  triceps 2+  2+  patellar 2+  2+  ankle jerk 2+  2+  Hoffman no  no  plantar response down  down   SENSORY:  Vibration reduced over the left ankle.  Intact to pin prick, temperature, and light touch throughout.  COORDINATION/GAIT: Normal finger-to- nose-finger.  Intact rapid alternating movements bilaterally. Gait is assisted with walker, he tends to keep the left leg extended and there is no heel-toe walking on the left.   IMPRESSION: Left leg pain and weakness following rectal surgery.  Ddx: ?plexopathy vs sciatic neuropathy affecting predominate tibial fascicles, vs S1 radiculopathy. Exam shows plantar and toe flexion weakness and reduced sensation. There is tightness of the Achilles tendon and spasm of the gastrocnemius muscle. Proximal muscles are intact. I will further investigate symptoms with NCS/EMG and imaging of the pelvis to look at the plexus and sciatic nerve.   PLAN/RECOMMENDATIONS:  NCS/EMG of the left leg MRI pelvis wwo contrast Continue gabapentin 900mg  TID and amitriptyline 25mg  at bedtime.  Amitriptyline can be increased in 2 weeks if needed. Patient informed I do not prescribe narcotic medication Start PT for leg stretching  Further recommendations pending results.    Thank you for allowing me to participate in patient's care.  If I can answer any additional questions, I would be pleased to do so.    Sincerely,    Anees Vanecek K. Posey Pronto, DO

## 2021-12-02 ENCOUNTER — Encounter: Payer: Self-pay | Admitting: Neurology

## 2021-12-02 ENCOUNTER — Other Ambulatory Visit: Payer: Self-pay

## 2021-12-02 ENCOUNTER — Ambulatory Visit: Payer: BC Managed Care – PPO | Admitting: Neurology

## 2021-12-02 VITALS — BP 132/83 | HR 117 | Ht 67.0 in | Wt 215.0 lb

## 2021-12-02 DIAGNOSIS — C20 Malignant neoplasm of rectum: Secondary | ICD-10-CM | POA: Diagnosis not present

## 2021-12-02 DIAGNOSIS — M79672 Pain in left foot: Secondary | ICD-10-CM | POA: Diagnosis not present

## 2021-12-02 DIAGNOSIS — G5702 Lesion of sciatic nerve, left lower limb: Secondary | ICD-10-CM

## 2021-12-02 DIAGNOSIS — R29898 Other symptoms and signs involving the musculoskeletal system: Secondary | ICD-10-CM

## 2021-12-02 MED ORDER — BACLOFEN 10 MG PO TABS: ORAL_TABLET | ORAL | 5 refills | Status: DC

## 2021-12-02 NOTE — Patient Instructions (Addendum)
MRI pelvis wwo contrast  Start baclofen 10mg  1 tablet at bedtime x 1 week, then increase to 10mg  twice daily  Nerve testing of the left leg  ELECTROMYOGRAM AND NERVE CONDUCTION STUDIES (EMG/NCS) INSTRUCTIONS  How to Prepare The neurologist conducting the EMG will need to know if you have certain medical conditions. Tell the neurologist and other EMG lab personnel if you: Have a pacemaker or any other electrical medical device Take blood-thinning medications Have hemophilia, a blood-clotting disorder that causes prolonged bleeding Bathing Take a shower or bath shortly before your exam in order to remove oils from your skin. Dont apply lotions or creams before the exam.  What to Expect Youll likely be asked to change into a hospital gown for the procedure and lie down on an examination table. The following explanations can help you understand what will happen during the exam.  Electrodes. The neurologist or a technician places surface electrodes at various locations on your skin depending on where youre experiencing symptoms. Or the neurologist may insert needle electrodes at different sites depending on your symptoms.  Sensations. The electrodes will at times transmit a tiny electrical current that you may feel as a twinge or spasm. The needle electrode may cause discomfort or pain that usually ends shortly after the needle is removed. If you are concerned about discomfort or pain, you may want to talk to the neurologist about taking a short break during the exam.  Instructions. During the needle EMG, the neurologist will assess whether there is any spontaneous electrical activity when the muscle is at rest - activity that isnt present in healthy muscle tissue - and the degree of activity when you slightly contract the muscle.  He or she will give you instructions on resting and contracting a muscle at appropriate times. Depending on what muscles and nerves the neurologist is examining, he or  she may ask you to change positions during the exam.  After your EMG You may experience some temporary, minor bruising where the needle electrode was inserted into your muscle. This bruising should fade within several days. If it persists, contact your primary care doctor.

## 2021-12-03 ENCOUNTER — Other Ambulatory Visit: Payer: Self-pay | Admitting: Oncology

## 2021-12-05 ENCOUNTER — Telehealth: Payer: Self-pay | Admitting: *Deleted

## 2021-12-05 ENCOUNTER — Other Ambulatory Visit: Payer: Self-pay | Admitting: Nurse Practitioner

## 2021-12-05 NOTE — Telephone Encounter (Signed)
Called patient to f/u on his request for oxycodone refill. He reports it is for his foot/leg pain. Gabapentin is helping, but tends to worsen at night and oxycodone helps calm it down so he can sleep.He will be starting physical therapy tomorrow. Inquired about his referral to pain management by [facility] surgeon and he reports that he declined to f/u on this wanting all his care provided locally. Informed him that once patient has completed treatment and the pain related to surgery, it is best for the surgeon to order narcotics. Dr. Benay Spice agrees to refill today, but suggested he look into pain management for the future.

## 2021-12-06 ENCOUNTER — Other Ambulatory Visit: Payer: Self-pay | Admitting: Nurse Practitioner

## 2021-12-06 ENCOUNTER — Encounter: Payer: Self-pay | Admitting: Oncology

## 2021-12-06 DIAGNOSIS — M25672 Stiffness of left ankle, not elsewhere classified: Secondary | ICD-10-CM | POA: Diagnosis not present

## 2021-12-06 DIAGNOSIS — M25675 Stiffness of left foot, not elsewhere classified: Secondary | ICD-10-CM | POA: Diagnosis not present

## 2021-12-06 DIAGNOSIS — C20 Malignant neoplasm of rectum: Secondary | ICD-10-CM

## 2021-12-06 DIAGNOSIS — R202 Paresthesia of skin: Secondary | ICD-10-CM | POA: Diagnosis not present

## 2021-12-06 MED ORDER — OXYCODONE HCL 5 MG PO TABS: 5.0000 mg | ORAL_TABLET | Freq: Every day | ORAL | 0 refills | Status: DC | PRN

## 2021-12-08 ENCOUNTER — Encounter: Payer: Self-pay | Admitting: Neurology

## 2021-12-09 DIAGNOSIS — M25675 Stiffness of left foot, not elsewhere classified: Secondary | ICD-10-CM | POA: Diagnosis not present

## 2021-12-09 DIAGNOSIS — R202 Paresthesia of skin: Secondary | ICD-10-CM | POA: Diagnosis not present

## 2021-12-09 DIAGNOSIS — M25672 Stiffness of left ankle, not elsewhere classified: Secondary | ICD-10-CM | POA: Diagnosis not present

## 2021-12-13 DIAGNOSIS — M25672 Stiffness of left ankle, not elsewhere classified: Secondary | ICD-10-CM | POA: Diagnosis not present

## 2021-12-13 DIAGNOSIS — M79662 Pain in left lower leg: Secondary | ICD-10-CM | POA: Diagnosis not present

## 2021-12-13 DIAGNOSIS — R202 Paresthesia of skin: Secondary | ICD-10-CM | POA: Diagnosis not present

## 2021-12-13 DIAGNOSIS — M25675 Stiffness of left foot, not elsewhere classified: Secondary | ICD-10-CM | POA: Diagnosis not present

## 2021-12-15 DIAGNOSIS — M25672 Stiffness of left ankle, not elsewhere classified: Secondary | ICD-10-CM | POA: Diagnosis not present

## 2021-12-15 DIAGNOSIS — R202 Paresthesia of skin: Secondary | ICD-10-CM | POA: Diagnosis not present

## 2021-12-15 DIAGNOSIS — M25675 Stiffness of left foot, not elsewhere classified: Secondary | ICD-10-CM | POA: Diagnosis not present

## 2021-12-19 DIAGNOSIS — Z932 Ileostomy status: Secondary | ICD-10-CM | POA: Diagnosis not present

## 2021-12-19 DIAGNOSIS — Z4689 Encounter for fitting and adjustment of other specified devices: Secondary | ICD-10-CM | POA: Diagnosis not present

## 2021-12-20 ENCOUNTER — Ambulatory Visit
Admission: RE | Admit: 2021-12-20 | Discharge: 2021-12-20 | Disposition: A | Discharge status: Home / Self Care | Payer: BC Managed Care – PPO | Source: Ambulatory Visit | Attending: Neurology | Admitting: Neurology

## 2021-12-20 DIAGNOSIS — R200 Anesthesia of skin: Secondary | ICD-10-CM | POA: Diagnosis not present

## 2021-12-20 DIAGNOSIS — R29898 Other symptoms and signs involving the musculoskeletal system: Secondary | ICD-10-CM

## 2021-12-20 DIAGNOSIS — M5432 Sciatica, left side: Secondary | ICD-10-CM | POA: Diagnosis not present

## 2021-12-20 DIAGNOSIS — M79672 Pain in left foot: Secondary | ICD-10-CM

## 2021-12-20 DIAGNOSIS — G629 Polyneuropathy, unspecified: Secondary | ICD-10-CM | POA: Diagnosis not present

## 2021-12-20 DIAGNOSIS — M25675 Stiffness of left foot, not elsewhere classified: Secondary | ICD-10-CM | POA: Diagnosis not present

## 2021-12-20 DIAGNOSIS — G5702 Lesion of sciatic nerve, left lower limb: Secondary | ICD-10-CM

## 2021-12-20 DIAGNOSIS — M25672 Stiffness of left ankle, not elsewhere classified: Secondary | ICD-10-CM | POA: Diagnosis not present

## 2021-12-20 DIAGNOSIS — C20 Malignant neoplasm of rectum: Secondary | ICD-10-CM

## 2021-12-20 DIAGNOSIS — R531 Weakness: Secondary | ICD-10-CM | POA: Diagnosis not present

## 2021-12-20 DIAGNOSIS — R202 Paresthesia of skin: Secondary | ICD-10-CM | POA: Diagnosis not present

## 2021-12-20 IMAGING — MR MR PELVIS WO/W CM
10 of 13 series · 29 of 48 positions shown · IV contrast (multihance)
Comparison: MRI pelvis dated [DATE].

CLINICAL DATA: Left sciatic neuropathy. Concern for nerve injury
after colorectal surgery for rectal cancer last [REDACTED]. Left foot
weakness and numbness.

EXAM:
MRI PELVIS WITHOUT AND WITH CONTRAST
TECHNIQUE: Multiplanar multisequence MR imaging of the pelvis was performed
both before and after administration of intravenous contrast.
CONTRAST:  20mL MULTIHANCE GADOBENATE DIMEGLUMINE 529 MG/ML IV SOLN

[Series 6: T2 · sagittal · 4.0mm · 1.12mm/px · 1 of 25 slices shown]
[im 1/25]
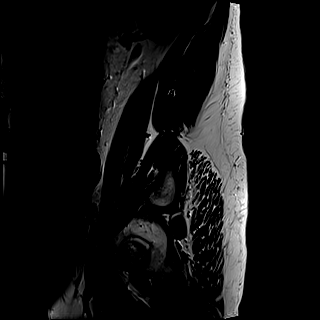

[Series 7: T1 · coronal · 3.0mm · 1.06mm/px · 1 of 52 slices shown (1 of 2)]
[im 1/52]
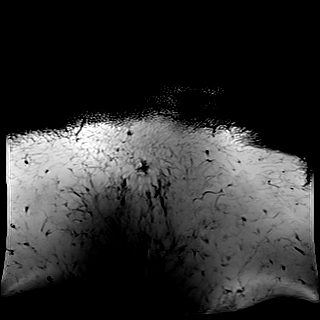

[Series 11: T2 fat-sat · axial · 4.0mm · 0.53mm/px · z∈[-87,+261]mm · 3 of 71 slices shown]
[im 1/71]
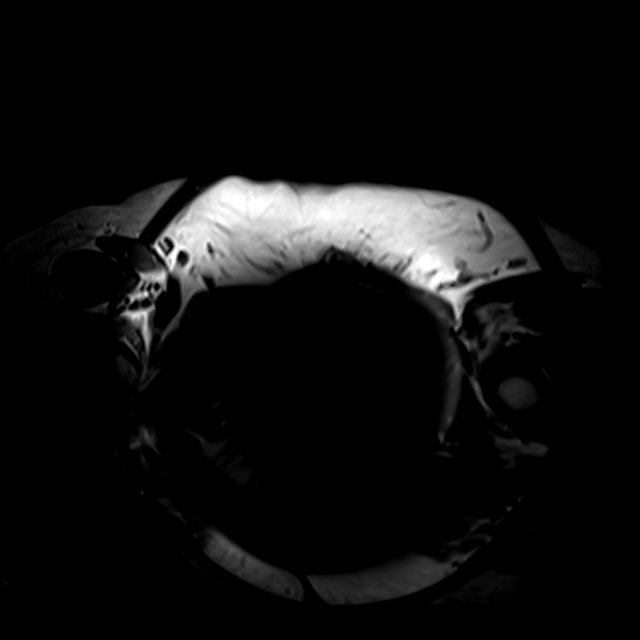
[im 36/71]
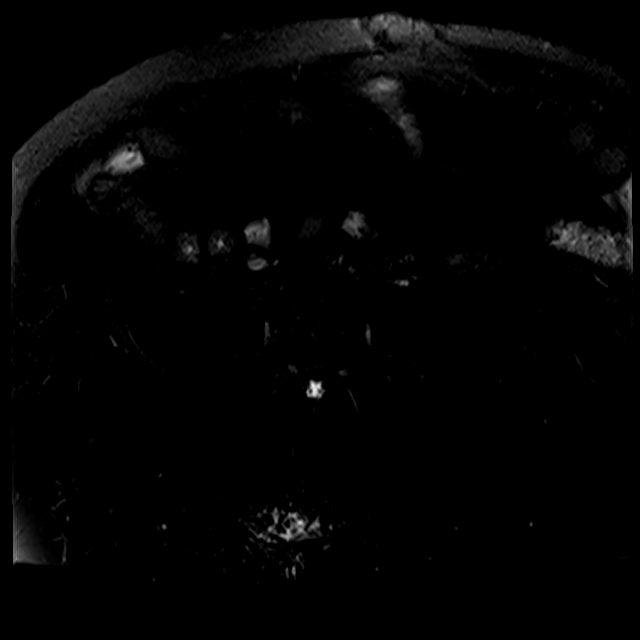
[im 71/71]
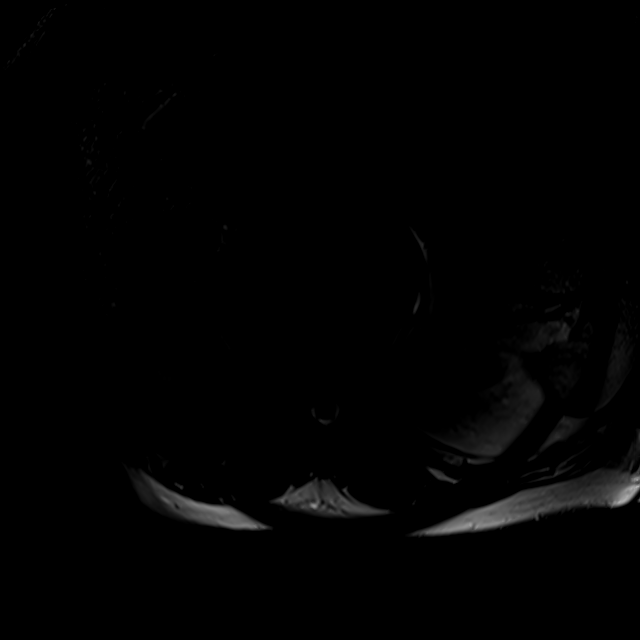

[Series 12: T1 · axial · 4.0mm · 1.41mm/px · z∈[-86,+262]mm · 3 of 71 slices shown (2 of 2)]
[im 1/71]
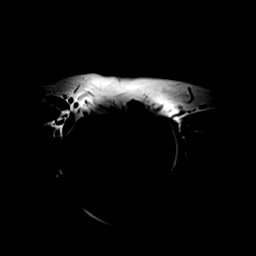
[im 36/71]
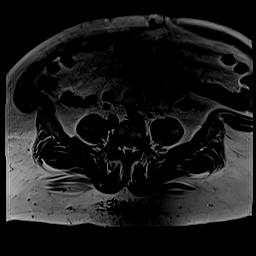
[im 71/71]
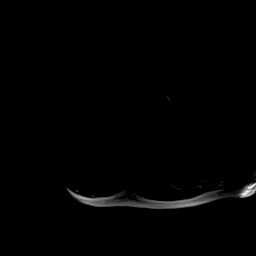

[Series 13: T1 fat-sat · axial · non-contrast · 4.0mm · 1.06mm/px · z∈[-87,+261]mm · 3 of 71 slices shown]
[im 1/71]
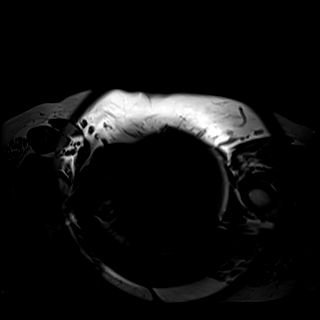
[im 36/71]
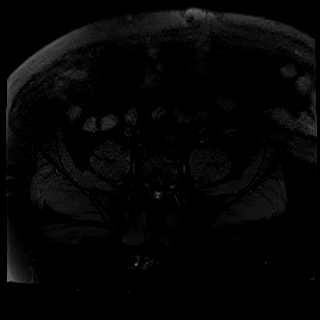
[im 71/71]
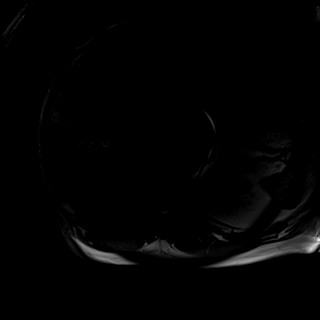

[Series 14: [id number] · coronal · 1.0mm · 0.89mm/px · 3 of 80 slices shown]
[im 1/80]
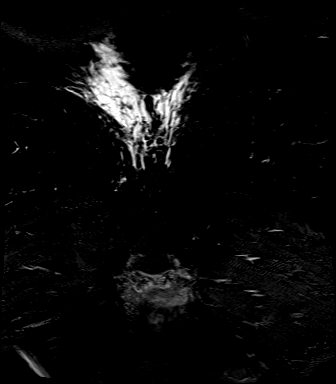
[im 40/80]
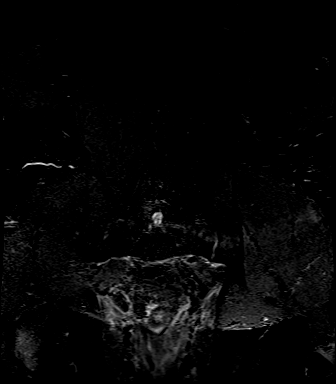
[im 80/80]
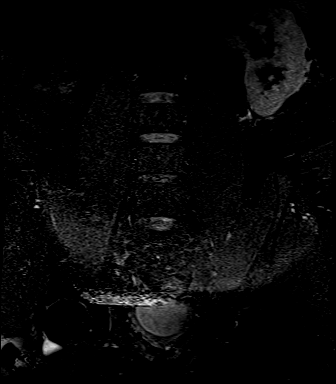

[Series 15: T1 fat-sat post-contrast · coronal · 3.0mm · 1.06mm/px · 2 of 52 slices shown (1 of 2)]
[im 1/52]
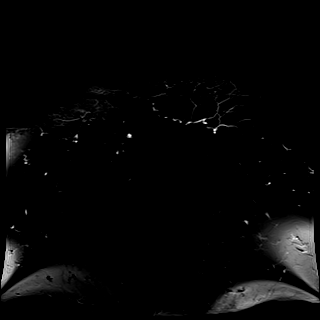
[im 52/52]
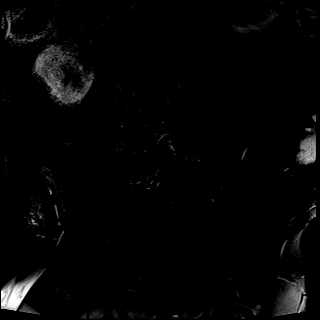

[Series 16: T1 fat-sat post-contrast · axial · 4.0mm · 1.06mm/px · z∈[-87,+261]mm · 3 of 71 slices shown (2 of 2)]
[im 1/71]
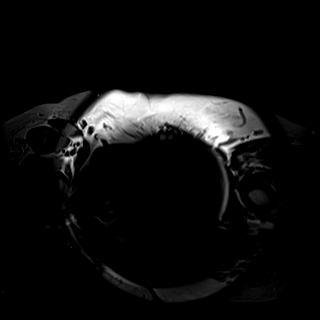
[im 36/71]
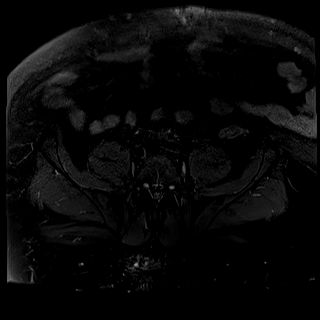
[im 71/71]
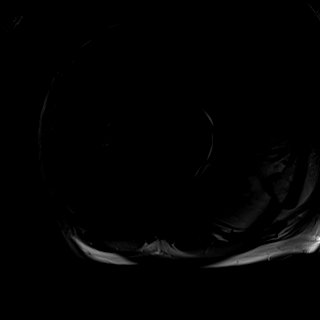

[Series 100: T1 dynamic fat-sat · axial · 3.0mm · 1.33mm/px · z∈[-53,+262]mm · 7 of 161 slices shown (1 of 2)]
[im 1/161]
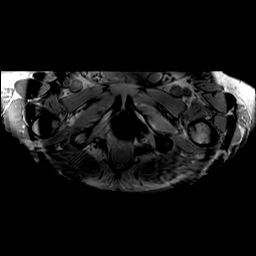
[im 27/161]
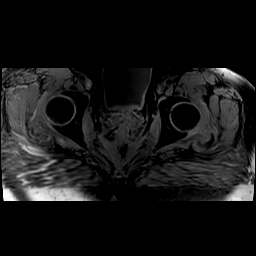
[im 54/161]
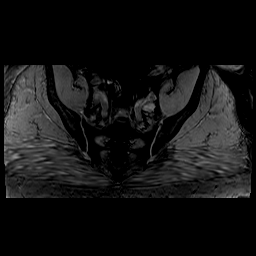
[im 81/161]
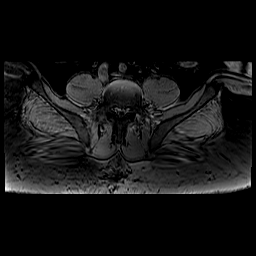
[im 107/161]
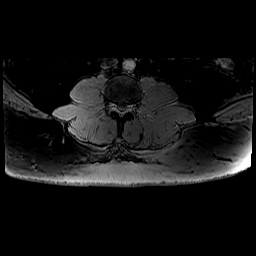
[im 134/161]
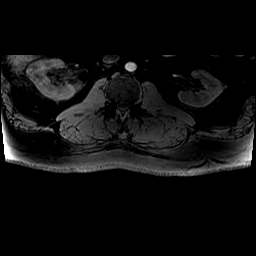
[im 161/161]
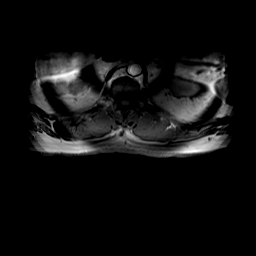

[Series 101: T1 dynamic fat-sat · sagittal · 3.0mm · 1.33mm/px · 3 of 67 slices shown (2 of 2)]
[im 1/67]
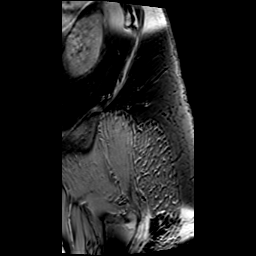
[im 34/67]
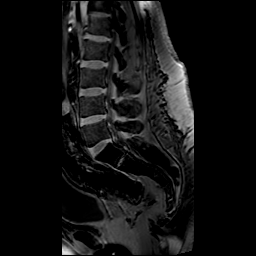
[im 67/67]
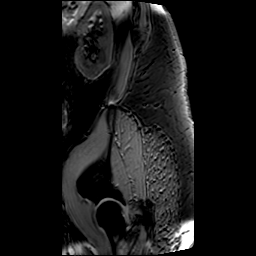

[29 of 48 positions shown; findings below may reference images not displayed]

FINDINGS: Urinary Tract:  No abnormality visualized.

Bowel: Postsurgical changes from low anterior resection with
right-sided ileostomy. 1.7 x 2.6 x 2.5 cm thick-walled rim enhancing
gas and fluid collection in the presacral space communicating with
the coloanal anastomosis (series 11, image 54; series 6, image 13).

Vascular/Lymphatic: No pathologically enlarged lymph nodes. No
significant vascular abnormality seen.

Reproductive:  Normal prostate.

Other:  Fat containing umbilical hernia.

Musculoskeletal: The lumbosacral plexus and sciatic nerves are
symmetric and normal in appearance bilaterally. There is no lumbar
spinal canal or neuroforaminal stenosis. No suspicious marrow signal
abnormality.

Reactive symmetric edema and enhancement involving the lateral and
posterior pelvic sidewall muscles.
IMPRESSION: 1. Normal appearance of the lumbosacral plexus and sciatic nerves
bilaterally.
2. Prior low anterior resection with 1.7 x 2.6 x 2.5 cm thick-walled
rim enhancing gas and fluid collection in the presacral space
communicating with the coloanal anastomosis, consistent with leak
and small abscess. This could potentially irritate the proximal S2
and S3 nerve roots.

## 2021-12-20 MED ORDER — GADOBENATE DIMEGLUMINE 529 MG/ML IV SOLN
20.0000 mL | Freq: Once | INTRAVENOUS | Status: AC | PRN
  Administered 2021-12-20: 20 mL via INTRAVENOUS

## 2021-12-22 DIAGNOSIS — M25672 Stiffness of left ankle, not elsewhere classified: Secondary | ICD-10-CM | POA: Diagnosis not present

## 2021-12-22 DIAGNOSIS — M25675 Stiffness of left foot, not elsewhere classified: Secondary | ICD-10-CM | POA: Diagnosis not present

## 2021-12-22 DIAGNOSIS — R202 Paresthesia of skin: Secondary | ICD-10-CM | POA: Diagnosis not present

## 2021-12-23 DIAGNOSIS — Z932 Ileostomy status: Secondary | ICD-10-CM | POA: Diagnosis not present

## 2021-12-23 DIAGNOSIS — C20 Malignant neoplasm of rectum: Secondary | ICD-10-CM | POA: Diagnosis not present

## 2021-12-27 DIAGNOSIS — M25675 Stiffness of left foot, not elsewhere classified: Secondary | ICD-10-CM | POA: Diagnosis not present

## 2021-12-27 DIAGNOSIS — M25672 Stiffness of left ankle, not elsewhere classified: Secondary | ICD-10-CM | POA: Diagnosis not present

## 2021-12-27 DIAGNOSIS — R202 Paresthesia of skin: Secondary | ICD-10-CM | POA: Diagnosis not present

## 2021-12-29 DIAGNOSIS — M25675 Stiffness of left foot, not elsewhere classified: Secondary | ICD-10-CM | POA: Diagnosis not present

## 2021-12-29 DIAGNOSIS — R202 Paresthesia of skin: Secondary | ICD-10-CM | POA: Diagnosis not present

## 2021-12-29 DIAGNOSIS — M25672 Stiffness of left ankle, not elsewhere classified: Secondary | ICD-10-CM | POA: Diagnosis not present

## 2022-01-03 ENCOUNTER — Other Ambulatory Visit: Payer: Self-pay

## 2022-01-03 ENCOUNTER — Ambulatory Visit: Payer: BC Managed Care – PPO | Admitting: Neurology

## 2022-01-03 DIAGNOSIS — M5417 Radiculopathy, lumbosacral region: Secondary | ICD-10-CM | POA: Diagnosis not present

## 2022-01-03 DIAGNOSIS — R29898 Other symptoms and signs involving the musculoskeletal system: Secondary | ICD-10-CM | POA: Diagnosis not present

## 2022-01-03 DIAGNOSIS — C20 Malignant neoplasm of rectum: Secondary | ICD-10-CM | POA: Diagnosis not present

## 2022-01-03 DIAGNOSIS — M79672 Pain in left foot: Secondary | ICD-10-CM

## 2022-01-03 NOTE — Procedures (Signed)
[facility]  [address], [address]  [place], [place] [address] Tel: [phone] Fax:  [phone] Test Date:  01/03/2022  Patient: Eric Lewis DOB: 03/01/1971 Physician: Narda Amber, DO  Sex: Male Height: 5\' 7"  Ref Phys: Narda Amber, DO  ID#: [id number]   Technician:    Patient Complaints: This is a 51 year old man with history of rectal cancer referred for evaluation of left foot weakness and paresthesias.  NCV & EMG Findings: Extensive electrodiagnostic testing of the left lower extremity shows:  Left sural and superficial peroneal sensory responses are within normal limits. Left peroneal motor responses are within normal limits.  Left tibial motor response shows reduced amplitude (2.5 mV). Left tibial H reflex study is mildly prolonged. Chronic motor axonal loss changes are seen affecting the left gastrocnemius, flexor digitorum longus, biceps femoris short head muscles, without accompanying active denervation.   Impression: Chronic S1 radiculopathy affecting the left lower extremity, mild. There is no evidence of a lumbosacral plexopathy or sensorimotor polyneuropathy.   ___________________________ Narda Amber, DO    Nerve Conduction Studies Anti Sensory Summary Table   Stim Site NR Peak (ms) Norm Peak (ms) P-T Amp (V) Norm P-T Amp  Left Sup Peroneal Anti Sensory (Ant Lat Mall)  32C  12 cm    2.8 <4.6 7.0 >4  Left Sural Anti Sensory (Lat Mall)  32C  Calf    3.6 <4.6 8.1 >4   Motor Summary Table   Stim Site NR Onset (ms) Norm Onset (ms) O-P Amp (mV) Norm O-P Amp Site1 Site2 Delta-0 (ms) Dist (cm) Vel (m/s) Norm Vel (m/s)  Left Peroneal Motor (Ext Dig Brev)  32C  Ankle    3.7 <6.0 4.7 >2.5 B Fib Ankle 7.8 36.0 46 >40  B Fib    11.5  4.3  Poplt B Fib 1.4 7.0 50 >40  Poplt    12.9  4.3         Left Peroneal TA Motor (Tib Ant)  32C  Fib Head    2.7 <4.5 3.9 >3 Poplit Fib Head 1.3 8.0 62 >40  Poplit    4.0  3.8         Left Tibial Motor (Abd  Hall Brev)  32C  Ankle    3.8 <6.0 2.5 >4 Knee Ankle 8.5 41.0 48 >40  Knee    12.3  2.0          H Reflex Studies   NR H-Lat (ms) Lat Norm (ms) L-R H-Lat (ms)  Left Tibial (Gastroc)  32C     35.37 <35    EMG   Side Muscle Ins Act Fibs Psw Fasc Number Recrt Dur Dur. Amp Amp. Poly Poly. Comment  Left AntTibialis Nml Nml Nml Nml Nml Nml Nml Nml Nml Nml Nml Nml N/A  Left Gastroc Nml Nml Nml Nml 1- Rapid Some 1+ Some 1+ Some 1+ N/A  Left Flex Dig Long Nml Nml Nml Nml 1- Rapid Few 1+ Few 1+ Few 1+ N/A  Left RectFemoris Nml Nml Nml Nml Nml Nml Nml Nml Nml Nml Nml Nml N/A  Left GluteusMed Nml Nml Nml Nml Nml Nml Nml Nml Nml Nml Nml Nml N/A  Left BicepsFemS Nml Nml Nml Nml 1- Rapid Some 1+ Some 1+ Some 1+ N/A      Waveforms:

## 2022-01-03 NOTE — Progress Notes (Signed)
Follow-up Visit   Date: 01/03/22   Eric Lewis MRN: [id number] DOB: [birth year]   Interim History: Eric Lewis is a 51 y.o. right-handed Caucasian male with diabetes mellitus, colorectal cancer s/p resection (10/2021), and hyperlipidemi returning to the clinic for follow-up of left leg weakness.  The patient was accompanied to the clinic by self.  History of present illness: He underwent robotic associated low antioer resection and diverting loop ileostomy for adenocarcinoma of the rectum.  Per op reports, he was positioned in the supine position for surgery.  Due to body habitus, rectal immobilization was challenging. Following his surgery, he recalls having left leg numbness and swelling.  The swelling persisted for about a month and slowly improved. [place] left leg was negative for DVT.  The swelling subsided about two weeks ago, and then he noticed at tight knot over the calf muscle, in addition to sharp left foot pain and tightness over the ball of the foot.  He occasionally has tingling. He is using a walker since surgery. He complains of weakness in the foot.   He takes gabapentin 900mg  three times daily and was started on amitriptyline 25mg  at bedtime earlier this week. He saw [facility] who recommended physical therapy to stretch the left leg.  MRI left tibia shows posterior muscle with myonecrosis. He denies any problems in the right leg.   UPDATE 01/03/2022:  He is here for EDX of the legs.  He has been doing PT and reports that over the past few days, the pain in the left foot and leg has improved.  MRI pelvis normal lumbosacral plexus and sciatic nerves, there was note of a possible small abscess and these findings have been shared with his surgeon.     Medications:  Current Outpatient Medications on File Prior to Visit  Medication Sig Dispense Refill   amitriptyline (ELAVIL) 25 MG tablet Take 25 mg by mouth at bedtime.     baclofen (LIORESAL) 10 MG tablet  Take 1 tablet at bedtime x 1 week, then increase to 1 tablet twice daily. 60 each 5   Continuous Blood Gluc Receiver (FREESTYLE LIBRE 14 DAY READER) DEVI See admin instructions. (Patient not taking: Reported on 04/05/2021)     Continuous Blood Gluc Sensor (FREESTYLE LIBRE 14 DAY SENSOR) MISC USE AS DIRECTED EVERY 14 DAYS     FARXIGA 5 MG TABS tablet Take 5 mg by mouth at bedtime.     gabapentin (NEURONTIN) 300 MG capsule Take 300 mg by mouth in the morning, at noon, and at bedtime.     loratadine (CLARITIN) 10 MG tablet Take 10 mg by mouth daily. (Patient not taking: Reported on 11/22/2021)     metFORMIN (GLUCOPHAGE) 500 MG tablet Take 2 tablets by mouth 2 (two) times daily.     ondansetron (ZOFRAN) 8 MG tablet Take 1 tablet (8 mg total) by mouth every 8 (eight) hours as needed for nausea or vomiting. Begin 72 hours after IV chemo treatment (Patient not taking: Reported on 03/22/2021) 30 tablet 1   OTEZLA 30 MG TABS Take 1 tablet by mouth 2 (two) times daily.     oxyCODONE (OXY IR/ROXICODONE) 5 MG immediate release tablet Take 1-2 tablets (5-10 mg total) by mouth daily as needed for severe pain. 30 tablet 0   prochlorperazine (COMPAZINE) 10 MG tablet Take 1 tablet (10 mg total) by mouth every 6 (six) hours as needed. (Patient not taking: Reported on 03/22/2021) 60 tablet 1   simvastatin (ZOCOR) 40  MG tablet Take 40 mg by mouth at bedtime.     Current Facility-Administered Medications on File Prior to Visit  Medication Dose Route Frequency Provider Last Rate Last Admin   heparin lock flush 100 unit/mL  500 Units Intravenous Once Ladell Pier, MD       sodium chloride flush (NS) 0.9 % injection 10 mL  10 mL Intravenous Once Ladell Pier, MD        Allergies:  Allergies  Allergen Reactions   Oxaliplatin Nausea Only, Rash and Cough    Patient had Hypersensitivity reaction to Oxaliplatin. See progress note on 06/28/2021.  Clearing of the throat, slightly wet cough, perspiration, eyes watering  & red, face, neck & hands red, rash to back, slightly swollen hands.   Lisinopril Other (See Comments)    Patient is unsure of this allergy    Vital Signs:  There were no vitals taken for this visit.   Exam deferred  Data: MRI pelvis wwo contrast 12/21/2021: 1. Normal appearance of the lumbosacral plexus and sciatic nerves bilaterally. 2. Prior low anterior resection with 1.7 x 2.6 x 2.5 cm thick-walled rim enhancing gas and fluid collection in the presacral space communicating with the coloanal anastomosis, consistent with leak and small abscess. This could potentially irritate the proximal S2 and S3 nerve roots.  NCS/EMG of the left leg 01/03/2022: Chronic S1 radiculopathy affecting the left lower extremity, mild. There is no evidence of a lumbosacral plexopathy or sensorimotor polyneuropathy.  IMPRESSION/PLAN: Left leg pain and weakness following rectal surgery.  Imaging of the pelvis shows normal lumbosacral plexus and sciatic nerves.  EMG today suggests that he may have S1 radiculopathy.  He has noticed mild improvement of pain with PT, but no significant improvement in weakness.  To further evaluate his symptoms, I will obtain MRI lumbar spine wwo contrast (contrasted imaging due to history of rectal cancer).  He will continue gabapentin 900mg  TID and amitriptyline 25mg  at bedtime.    Further recommendations pending results.   Thank you for allowing me to participate in patient's care.  If I can answer any additional questions, I would be pleased to do so.    Sincerely,    Yocelin Vanlue K. Posey Pronto, DO

## 2022-01-04 ENCOUNTER — Telehealth: Payer: Self-pay

## 2022-01-04 DIAGNOSIS — R29898 Other symptoms and signs involving the musculoskeletal system: Secondary | ICD-10-CM

## 2022-01-04 DIAGNOSIS — R202 Paresthesia of skin: Secondary | ICD-10-CM | POA: Diagnosis not present

## 2022-01-04 DIAGNOSIS — M25675 Stiffness of left foot, not elsewhere classified: Secondary | ICD-10-CM | POA: Diagnosis not present

## 2022-01-04 DIAGNOSIS — M5416 Radiculopathy, lumbar region: Secondary | ICD-10-CM

## 2022-01-04 DIAGNOSIS — M25672 Stiffness of left ankle, not elsewhere classified: Secondary | ICD-10-CM | POA: Diagnosis not present

## 2022-01-04 DIAGNOSIS — C20 Malignant neoplasm of rectum: Secondary | ICD-10-CM

## 2022-01-04 NOTE — Telephone Encounter (Signed)
-----   Message from Alda Berthold, DO sent at 01/03/2022  4:02 PM EST ----- ?Please order MRI lumbar spine wwo contrast to evaluate for lumbar radiculopathy (you can also associate - history of cancer, left leg weakness). Pt aware that we are ordering this. Thanks. ?

## 2022-01-06 DIAGNOSIS — M25672 Stiffness of left ankle, not elsewhere classified: Secondary | ICD-10-CM | POA: Diagnosis not present

## 2022-01-06 DIAGNOSIS — R202 Paresthesia of skin: Secondary | ICD-10-CM | POA: Diagnosis not present

## 2022-01-06 DIAGNOSIS — M25675 Stiffness of left foot, not elsewhere classified: Secondary | ICD-10-CM | POA: Diagnosis not present

## 2022-01-08 ENCOUNTER — Other Ambulatory Visit: Payer: Self-pay

## 2022-01-08 ENCOUNTER — Ambulatory Visit
Admission: RE | Admit: 2022-01-08 | Discharge: 2022-01-08 | Disposition: A | Discharge status: Home / Self Care | Payer: BC Managed Care – PPO | Source: Ambulatory Visit | Attending: Neurology | Admitting: Neurology

## 2022-01-08 DIAGNOSIS — Q7649 Other congenital malformations of spine, not associated with scoliosis: Secondary | ICD-10-CM | POA: Diagnosis not present

## 2022-01-08 DIAGNOSIS — R29898 Other symptoms and signs involving the musculoskeletal system: Secondary | ICD-10-CM

## 2022-01-08 DIAGNOSIS — C20 Malignant neoplasm of rectum: Secondary | ICD-10-CM

## 2022-01-08 DIAGNOSIS — R531 Weakness: Secondary | ICD-10-CM | POA: Diagnosis not present

## 2022-01-08 DIAGNOSIS — M4727 Other spondylosis with radiculopathy, lumbosacral region: Secondary | ICD-10-CM | POA: Diagnosis not present

## 2022-01-08 DIAGNOSIS — M5416 Radiculopathy, lumbar region: Secondary | ICD-10-CM

## 2022-01-08 DIAGNOSIS — M5116 Intervertebral disc disorders with radiculopathy, lumbar region: Secondary | ICD-10-CM | POA: Diagnosis not present

## 2022-01-08 IMAGING — MR MR LUMBAR SPINE WO/W CM
4 of 7 series · 25 of 48 positions shown · IV contrast (multihance)
Comparison: None.

CLINICAL DATA: Lumbar radiculopathy, left leg weakness, no red
flags, history of cancer

EXAM:
MRI LUMBAR SPINE WITHOUT AND WITH CONTRAST
TECHNIQUE: Multiplanar and multiecho pulse sequences of the lumbar spine were
obtained without and with intravenous contrast.
CONTRAST:  20mL MULTIHANCE GADOBENATE DIMEGLUMINE 529 MG/ML IV SOLN

[Series 3: T1 · sagittal · 4.0mm · 0.53mm/px · 5 of 15 slices shown (1 of 2)]
[im 1/15]
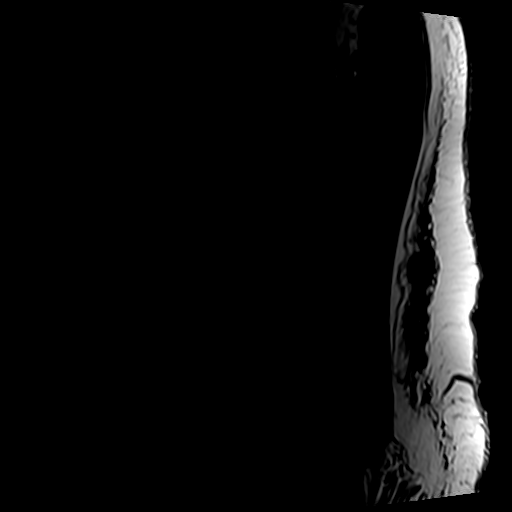
[im 4/15]
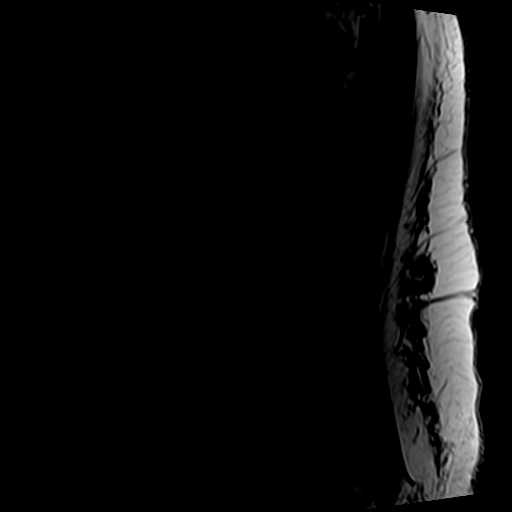
[im 8/15]
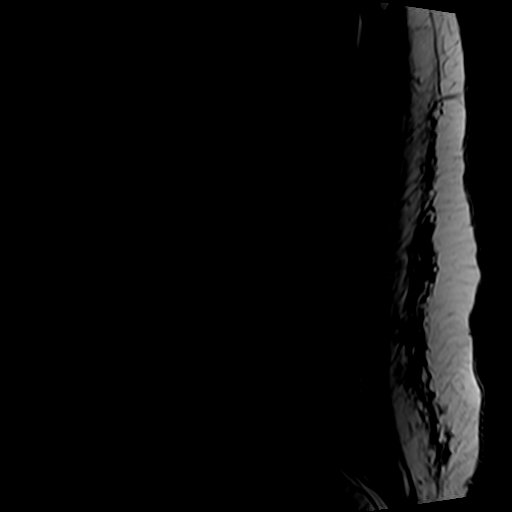
[im 11/15]
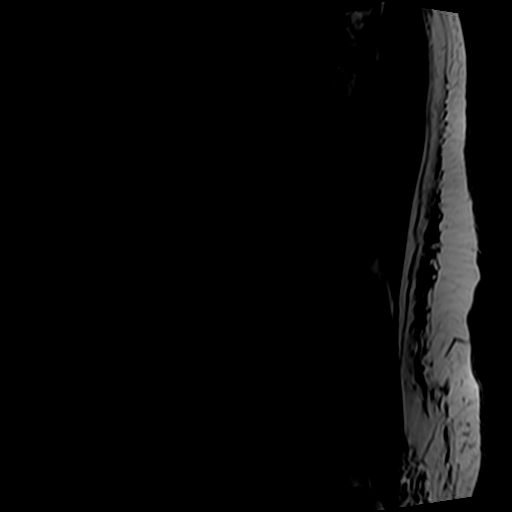
[im 15/15]
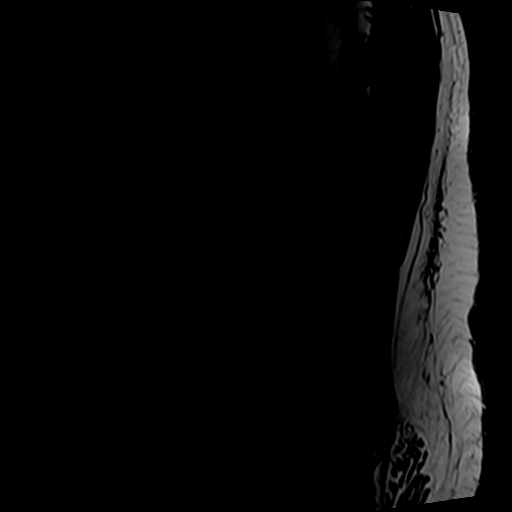

[Series 4: T2 · axial · 4.0mm · 0.70mm/px · z∈[-42,+172]mm · 8 of 38 slices shown (1 of 2)]
[im 1/38]
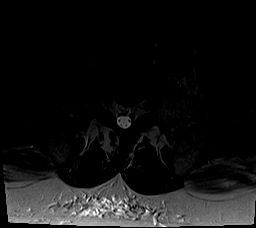
[im 5/38]
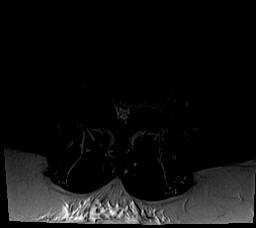
[im 13/38]
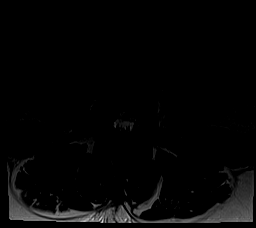
[im 17/38]
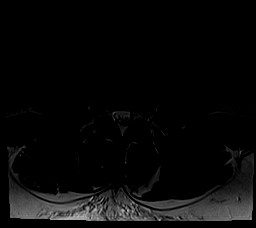
[im 21/38]
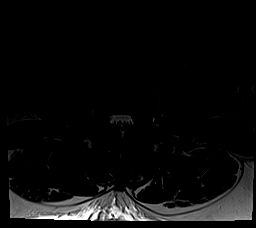
[im 25/38]
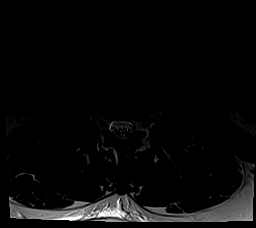
[im 33/38]
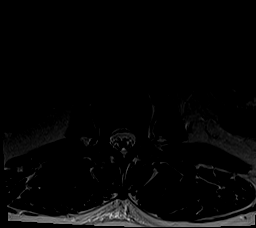
[im 38/38]
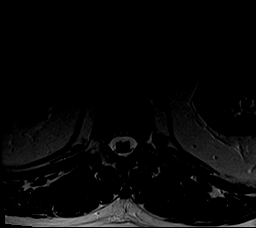

[Series 5: T1 · axial · 4.0mm · 0.35mm/px · z∈[-42,+172]mm · 8 of 38 slices shown (2 of 2)]
[im 1/38]
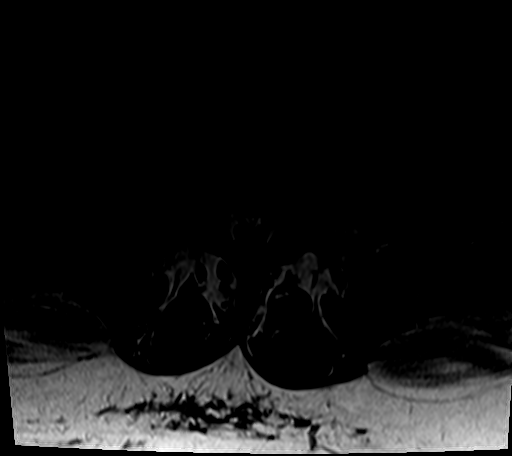
[im 5/38]
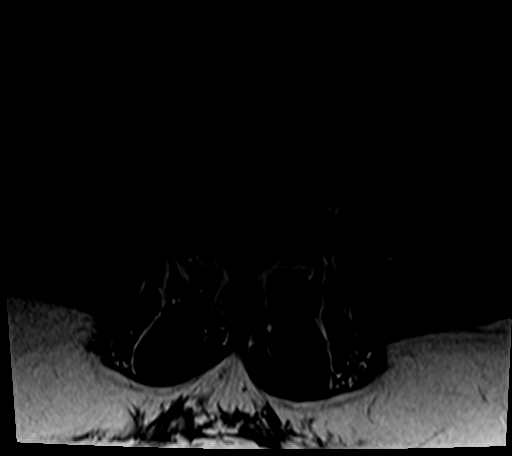
[im 13/38]
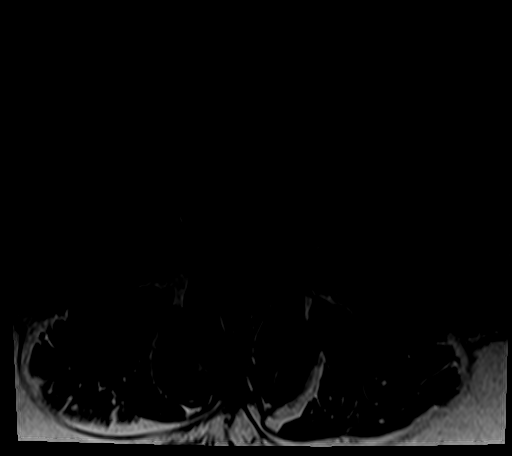
[im 17/38]
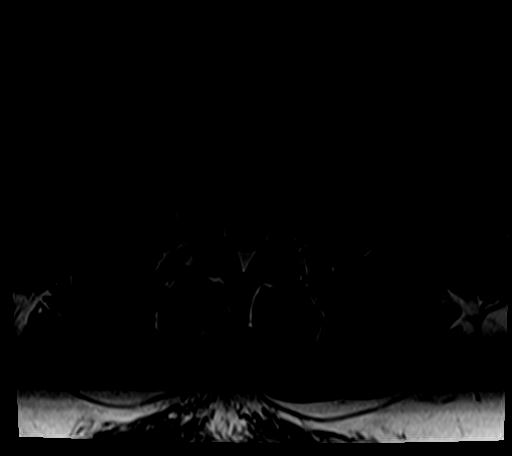
[im 21/38]
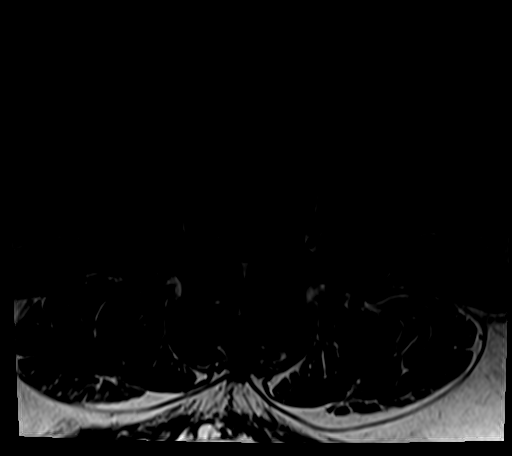
[im 25/38]
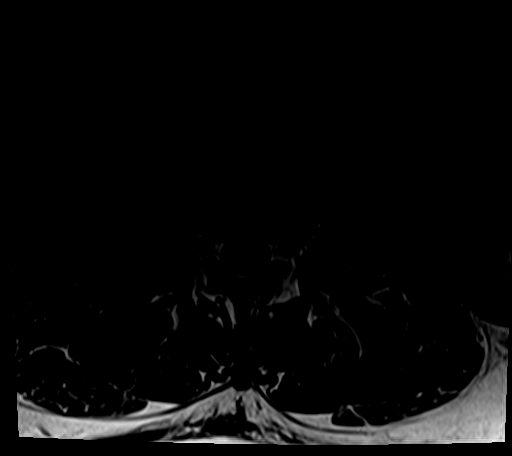
[im 33/38]
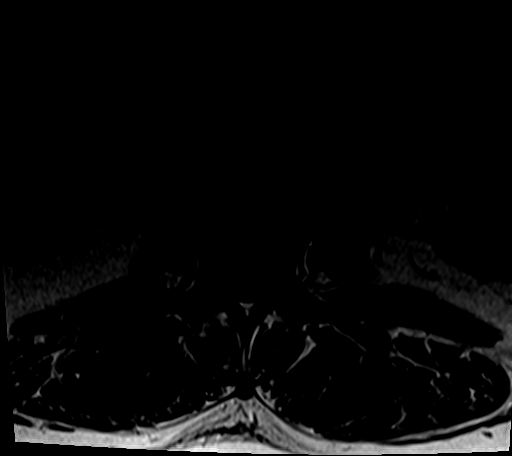
[im 38/38]
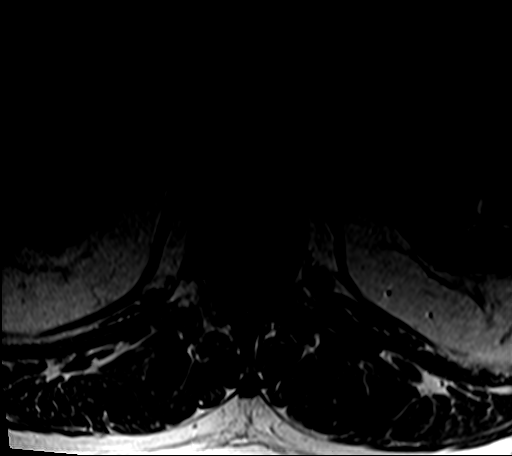

[Series 6: T2 · sagittal · 4.0mm · 0.53mm/px · 4 of 15 slices shown (2 of 2)]
[im 1/15]
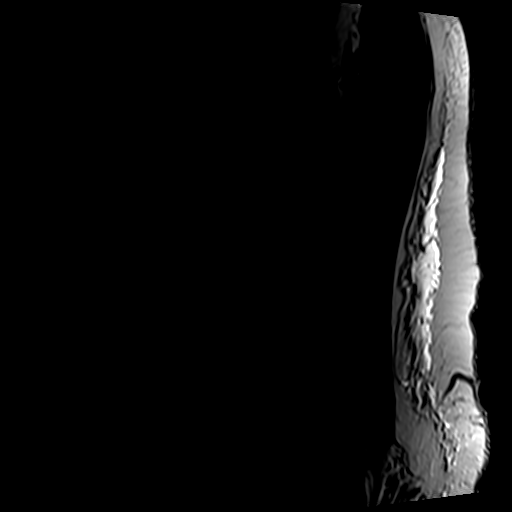
[im 5/15]
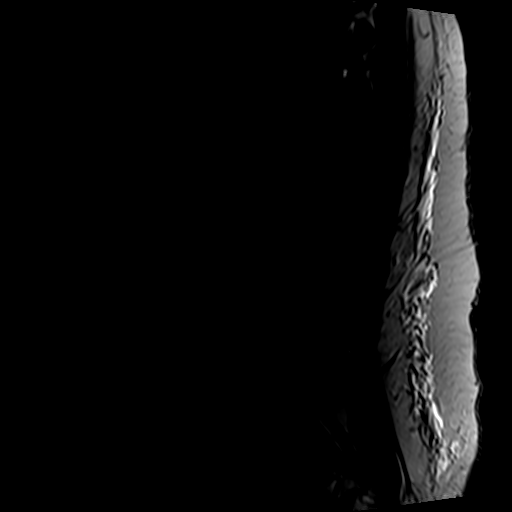
[im 10/15]
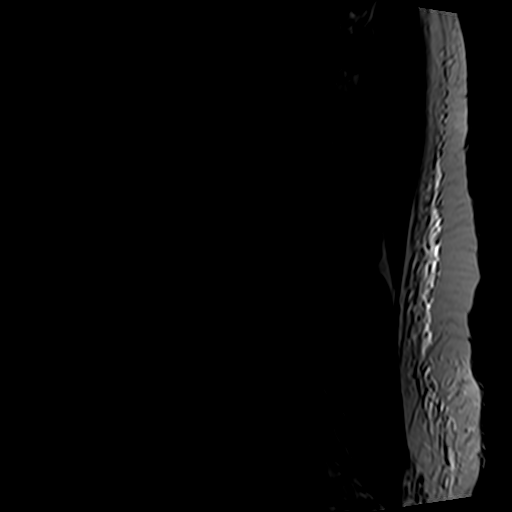
[im 15/15]
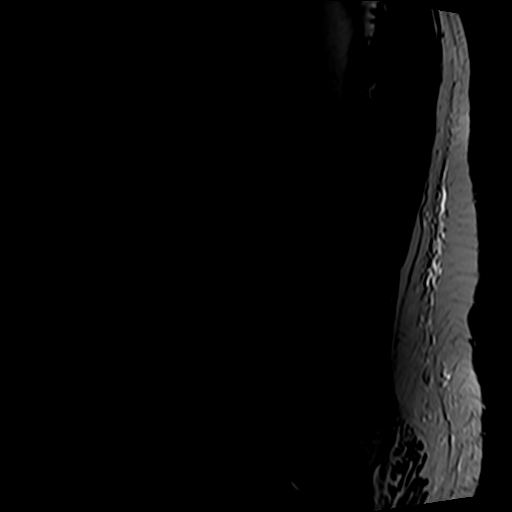

[25 of 48 positions shown; findings below may reference images not displayed]

FINDINGS: Segmentation:  Standard.

Alignment:  Mild dextrocurvature.  No listhesis.

Vertebrae: No acute fracture or suspicious osseous lesion. No
abnormal osseous enhancement. Congenitally short pedicles, which
narrow the AP diameter of the spinal canal.

Conus medullaris and cauda equina: Conus extends to the L1 level.
Conus and cauda equina appear normal. No abnormal enhancement.

Paraspinal and other soft tissues: Negative.

Disc levels:

T12-L1: No significant disc bulge. No spinal canal stenosis or
neural foraminal narrowing.

L1-L2: Mild disc bulge. No spinal canal stenosis or neural foraminal
narrowing.

L2-L3: No significant disc bulge. No spinal canal stenosis or neural
foraminal narrowing.

L3-L4: No significant disc bulge. Mild facet arthropathy. No spinal
canal stenosis or neural foraminal narrowing.

L4-L5: No significant disc bulge. Mild facet arthropathy. No spinal
canal stenosis or neural foraminal narrowing.

L5-S1: No significant disc bulge. Mild facet arthropathy. No spinal
canal stenosis or neural foraminal narrowing.
IMPRESSION: 1. No spinal canal stenosis or neural foraminal narrowing.
2. Mild facet arthropathy in the lower lumbar spine.
3. No abnormal enhancement of the vertebral bodies, conus
medullaris, cauda equina or paraspinous structures.

## 2022-01-08 MED ORDER — GADOBENATE DIMEGLUMINE 529 MG/ML IV SOLN
20.0000 mL | Freq: Once | INTRAVENOUS | Status: AC | PRN
  Administered 2022-01-08: 20 mL via INTRAVENOUS

## 2022-01-10 DIAGNOSIS — M25672 Stiffness of left ankle, not elsewhere classified: Secondary | ICD-10-CM | POA: Diagnosis not present

## 2022-01-10 DIAGNOSIS — R202 Paresthesia of skin: Secondary | ICD-10-CM | POA: Diagnosis not present

## 2022-01-10 DIAGNOSIS — M25675 Stiffness of left foot, not elsewhere classified: Secondary | ICD-10-CM | POA: Diagnosis not present

## 2022-01-12 ENCOUNTER — Encounter: Payer: Self-pay | Admitting: Neurology

## 2022-01-12 DIAGNOSIS — R202 Paresthesia of skin: Secondary | ICD-10-CM | POA: Diagnosis not present

## 2022-01-12 DIAGNOSIS — M25672 Stiffness of left ankle, not elsewhere classified: Secondary | ICD-10-CM | POA: Diagnosis not present

## 2022-01-12 DIAGNOSIS — M25675 Stiffness of left foot, not elsewhere classified: Secondary | ICD-10-CM | POA: Diagnosis not present

## 2022-01-24 DIAGNOSIS — M79672 Pain in left foot: Secondary | ICD-10-CM | POA: Diagnosis not present

## 2022-01-30 ENCOUNTER — Ambulatory Visit: Payer: BC Managed Care – PPO | Admitting: Neurology

## 2022-01-31 ENCOUNTER — Inpatient Hospital Stay: Payer: BC Managed Care – PPO | Attending: Oncology | Admitting: Oncology

## 2022-01-31 ENCOUNTER — Other Ambulatory Visit: Payer: Self-pay

## 2022-01-31 ENCOUNTER — Inpatient Hospital Stay: Payer: BC Managed Care – PPO

## 2022-01-31 VITALS — BP 138/92 | HR 100 | Temp 98.5°F | Resp 20 | Ht 67.0 in | Wt 235.2 lb

## 2022-01-31 DIAGNOSIS — C20 Malignant neoplasm of rectum: Secondary | ICD-10-CM | POA: Diagnosis not present

## 2022-01-31 DIAGNOSIS — E119 Type 2 diabetes mellitus without complications: Secondary | ICD-10-CM | POA: Insufficient documentation

## 2022-01-31 DIAGNOSIS — Z8616 Personal history of COVID-19: Secondary | ICD-10-CM | POA: Insufficient documentation

## 2022-01-31 LAB — BASIC METABOLIC PANEL - CANCER CENTER ONLY
Anion gap: 9 (ref 5–15)
BUN: 11 mg/dL (ref 6–20)
CO2: 27 mmol/L (ref 22–32)
Calcium: 9.9 mg/dL (ref 8.9–10.3)
Chloride: 102 mmol/L (ref 98–111)
Creatinine: 0.92 mg/dL (ref 0.61–1.24)
GFR, Estimated: >60 mL/min (ref >60)
Glucose, Bld: 118 mg/dL — ABNORMAL HIGH (ref 70–99)
Potassium: 4.3 mmol/L (ref 3.5–5.1)
Sodium: 138 mmol/L (ref 135–145)

## 2022-01-31 LAB — CEA (ACCESS): CEA (CHCC): 2.75 ng/mL (ref 0.00–5.00)

## 2022-01-31 NOTE — Progress Notes (Signed)
?[facility] ?OFFICE PROGRESS NOTE ? ? ?Diagnosis: Rectal cancer ? ?INTERVAL HISTORY:  ? ?Eric Lewis returns as scheduled.  He reports improvement in the left leg and foot pain.  He is no longer taking oxycodone.  He is taking gabapentin 3 times per day.  He plans to begin weaning the gabapentin.  Good appetite.  The ileostomy is functioning well.  The stool is generally formed.  He is scheduled for a "leak test "to evaluate the rectum next week. ? ?Objective: ? ?Vital signs in last 24 hours: ? ?Blood pressure (!) 138/92, pulse 100, temperature 98.5 ?F (36.9 ?C), temperature source Oral, resp. rate 20, height '5\' 7"'$  (1.702 m), weight 235 lb 3.2 oz (106.7 kg), SpO2 96 %. ?  ? ? ?Lymphatics: No cervical, supraclavicular, axillary, or inguinal nodes ?Resp: Lungs clear bilaterally ?Cardio: Regular rate and rhythm ?GI: No hepatosplenomegaly, right abdomen ileostomy with brown formed stool ?Vascular: No leg edema ?Neuro: Alert and oriented, the motor exam appears intact in the lower extremities bilaterally ?  ? ?Lab Results: ? ?Lab Results  ?Component Value Date  ? WBC 6.4 09/15/2021  ? HGB 14.4 09/15/2021  ? HCT 44.0 09/15/2021  ? MCV 94.2 09/15/2021  ? PLT 243 09/15/2021  ? NEUTROABS 4.2 09/15/2021  ? ? ?CMP  ?Lab Results  ?Component Value Date  ? NA 139 09/15/2021  ? K 4.3 09/15/2021  ? CL 103 09/15/2021  ? CO2 26 09/15/2021  ? GLUCOSE 112 (H) 09/15/2021  ? BUN 14 09/15/2021  ? CREATININE 0.97 09/15/2021  ? CALCIUM 9.3 09/15/2021  ? PROT 6.9 09/15/2021  ? ALBUMIN 4.4 09/15/2021  ? AST 15 09/15/2021  ? ALT 16 09/15/2021  ? ALKPHOS 66 09/15/2021  ? BILITOT 0.6 09/15/2021  ? GFRNONAA >60 09/15/2021  ? GFRAA >60 05/30/2016  ? ? ?Lab Results  ?Component Value Date  ? CEA 3.01 09/15/2021  ? ? ?No results found for: INR, LABPROT ? ? ?Medications: I have reviewed the patient's current medications. ? ? ?Assessment/Plan: ?Rectal cancer ?Colonoscopy 02/15/2021- nonobstructing mass at 2-3 cm in the anal verge, removed  in a piecemeal fashion, pathology confirmed moderately differentiated adenocarcinoma ?CTs chest, abdomen, and pelvis on 02/17/2021- no evidence of metastatic disease, no clear mass identified, areas of mild rectal wall thickening ?MRI 03/02/2021- rectal tumor not identified, no extension beyond the muscularis identified.  N1 disease with multiple left mesorectal nodes, and a 7 mm node beyond the mesorectum at the hypogastric region ?Cycle 1 FOLFOX 03/22/2021 ?Cycle 2 FOLFOX 04/05/2021 ?Cycle 3 FOLFOX 04/19/2021 ?Cycle 4 FOLFOX 05/03/2021 ?Cycle 5 FOLFOX 05/17/2021 ?Cycle 6 FOLFOX 05/31/2021 ?Cycle 7 FOLFOX 06/14/2021-Oxaliplatin infusion time lengthened, Benadryl and Pepcid added to premedications due to coughing, sneezing, throat clearing, diaphoresis during Oxaliplatin cycle 6 ?Cycle 8 FOLFOX 06/28/2021 ?Radiation/Xeloda 07/12/2021-08/18/2021 ?MRI pelvis 08/25/2021 at [facility]-mid rectal tumor not definitely visualized with persistent irregular anterior rectal wall thickening and redemonstrated irregularity of the muscularis propria.  Decreased size of left mesorectal and left internal iliac lymph nodes. ?Low anterior resection and diverting loop ileostomy 10/20/2021,ypT0,ypN0, submucosal fibrosis and treatment effect without evidence of residual tumor ?MRI pelvis 12/20/2021-thick walled rim-enhancing gas and fluid collection in the presacral space communicating with the coloanal anastomosis consistent with a leak and small abscess ?MRI lumbar spine 01/08/2022-no abnormal enhancement of the vertebral bodies, conus medullaris, cauda equina, paraspinal structures ?  ?History of dysphagia-status post a barium swallow 10/22/2020- mild smooth stricture at the GE junction ?Psoriatic arthritis ?Diabetes ?COVID-19 January 22 ?Coughing, sneezing, throat  clearing, diaphoresis during Oxaliplatin infusion cycle 6 FOLFOX ?Anaphylactic reaction to oxaliplatin 06/28/2021 ?Removal of malfunctioning Port-A-Cath 09/30/2021 ?Left calf muscle injury  during surgery 10/20/2021-MRI of the left tibia/fibula-myonecrosis in the posterior muscle compartments ? ? ?Disposition: ?Mr. Huston is in clinical remission from rectal cancer.  We will follow-up on the CEA from today.  He continues follow-up with the surgical service at [facility] for management of the ileostomy.  He is undergoing radiologic evaluation of the rectum prior to undergoing the ileostomy reversal. ?He continues follow-up with Dr. Dorthy Cooler for management of the left leg pain. ? ?He will return for an office visit here in late May.  We will schedule surveillance imaging after the next office visit here. ? ? ? ?Betsy Coder, MD ? ?01/31/2022  ?3:01 PM ? ? ?

## 2022-02-01 ENCOUNTER — Telehealth: Payer: Self-pay

## 2022-02-01 NOTE — Telephone Encounter (Signed)
Pt verbalized understanding.

## 2022-02-01 NOTE — Telephone Encounter (Signed)
-----   Message from Ladell Pier, MD sent at 01/31/2022  6:04 PM EDT ----- ?Please call patient, CEA is normal, follow-up as scheduled ? ?

## 2022-02-06 DIAGNOSIS — C20 Malignant neoplasm of rectum: Secondary | ICD-10-CM | POA: Diagnosis not present

## 2022-02-08 DIAGNOSIS — C20 Malignant neoplasm of rectum: Secondary | ICD-10-CM | POA: Diagnosis not present

## 2022-02-08 DIAGNOSIS — Z79899 Other long term (current) drug therapy: Secondary | ICD-10-CM | POA: Diagnosis not present

## 2022-02-08 DIAGNOSIS — Z932 Ileostomy status: Secondary | ICD-10-CM | POA: Diagnosis not present

## 2022-02-17 ENCOUNTER — Encounter: Payer: Self-pay | Admitting: Oncology

## 2022-02-17 DIAGNOSIS — Z932 Ileostomy status: Secondary | ICD-10-CM | POA: Diagnosis not present

## 2022-02-18 DIAGNOSIS — Z932 Ileostomy status: Secondary | ICD-10-CM | POA: Diagnosis not present

## 2022-02-22 ENCOUNTER — Other Ambulatory Visit: Payer: Self-pay | Admitting: Nurse Practitioner

## 2022-02-22 DIAGNOSIS — Z79899 Other long term (current) drug therapy: Secondary | ICD-10-CM | POA: Diagnosis not present

## 2022-02-22 DIAGNOSIS — L4050 Arthropathic psoriasis, unspecified: Secondary | ICD-10-CM | POA: Diagnosis not present

## 2022-02-22 DIAGNOSIS — L409 Psoriasis, unspecified: Secondary | ICD-10-CM | POA: Diagnosis not present

## 2022-02-22 DIAGNOSIS — C20 Malignant neoplasm of rectum: Secondary | ICD-10-CM

## 2022-03-03 ENCOUNTER — Ambulatory Visit (HOSPITAL_BASED_OUTPATIENT_CLINIC_OR_DEPARTMENT_OTHER)
Admission: RE | Admit: 2022-03-03 | Discharge: 2022-03-03 | Disposition: A | Discharge status: Home / Self Care | Payer: BC Managed Care – PPO | Source: Ambulatory Visit | Attending: Nurse Practitioner | Admitting: Nurse Practitioner

## 2022-03-03 ENCOUNTER — Inpatient Hospital Stay: Payer: BC Managed Care – PPO | Attending: Oncology

## 2022-03-03 DIAGNOSIS — C20 Malignant neoplasm of rectum: Secondary | ICD-10-CM | POA: Insufficient documentation

## 2022-03-03 DIAGNOSIS — K429 Umbilical hernia without obstruction or gangrene: Secondary | ICD-10-CM | POA: Diagnosis not present

## 2022-03-03 LAB — BASIC METABOLIC PANEL - CANCER CENTER ONLY
Anion gap: 10 (ref 5–15)
BUN: 12 mg/dL (ref 6–20)
CO2: 26 mmol/L (ref 22–32)
Calcium: 10 mg/dL (ref 8.9–10.3)
Chloride: 101 mmol/L (ref 98–111)
Creatinine: 0.93 mg/dL (ref 0.61–1.24)
GFR, Estimated: >60 mL/min (ref >60)
Glucose, Bld: 115 mg/dL — ABNORMAL HIGH (ref 70–99)
Potassium: 4.7 mmol/L (ref 3.5–5.1)
Sodium: 137 mmol/L (ref 135–145)

## 2022-03-03 IMAGING — CT CT CHEST-ABD-PELV W/ CM
2 of 5 series · 14 of 46 positions shown, 16 images · IV contrast (APPLIED)
Comparison: CT scan [DATE]

CLINICAL DATA: History of rectal cancer.

EXAM:
CT CHEST, ABDOMEN, AND PELVIS WITH CONTRAST
TECHNIQUE: Multidetector CT imaging of the chest, abdomen and pelvis was
performed following the standard protocol during bolus
administration of intravenous contrast.

[Series 2: cap with · axial · 0.98mm/px · z∈[-500,+75]mm · 11 of 139 slices shown, 13 images]
[im 12/139  soft-tissue]
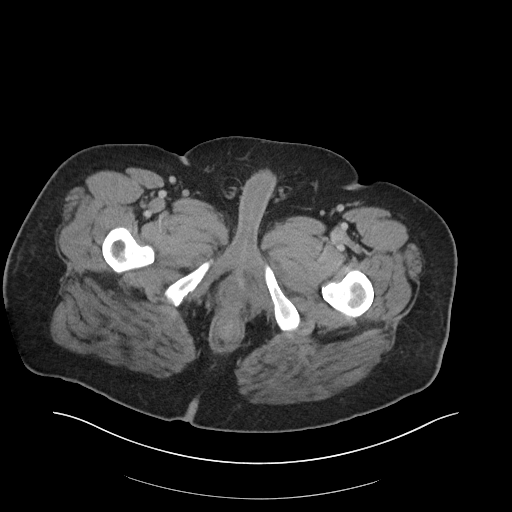
[im 12/139  bone]
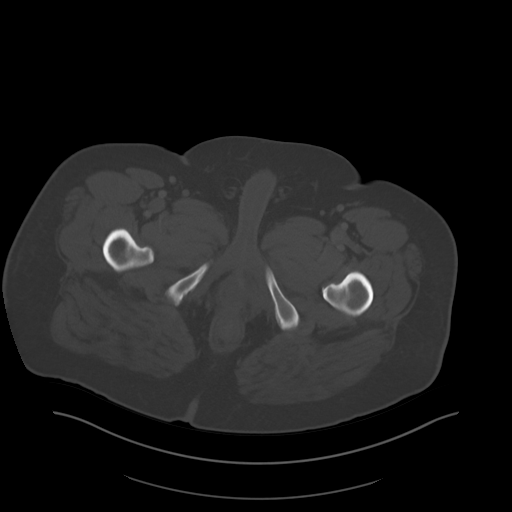
[im 24/139  soft-tissue]
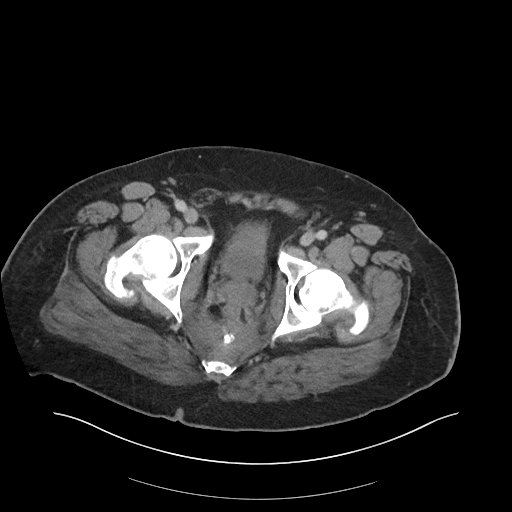
[im 35/139  soft-tissue]
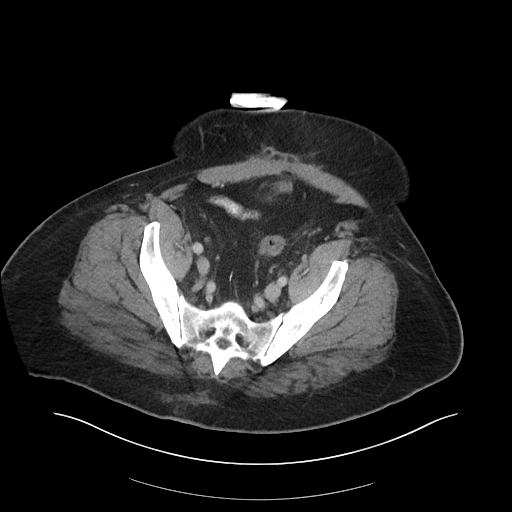
[im 47/139  soft-tissue]
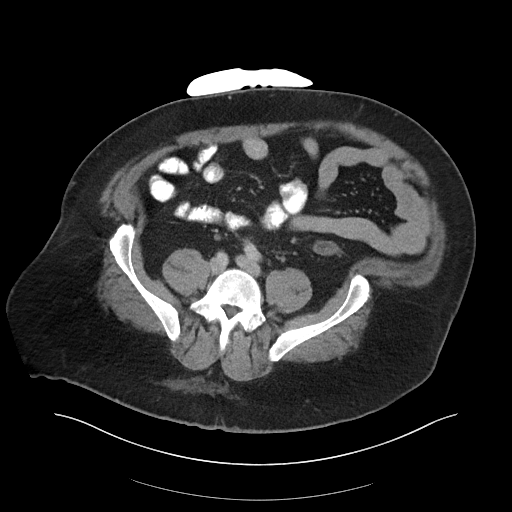
[im 58/139  soft-tissue]
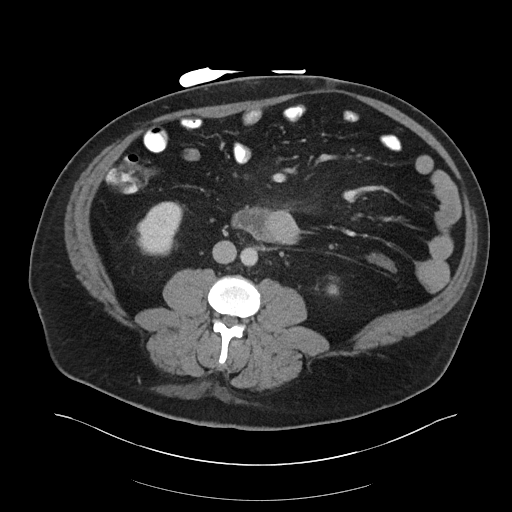
[im 70/139  soft-tissue]
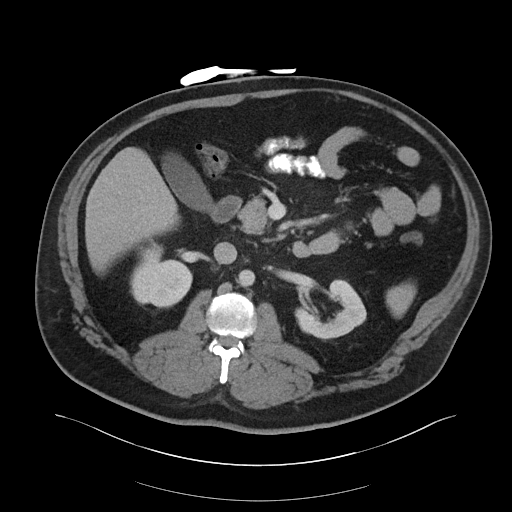
[im 81/139  soft-tissue]
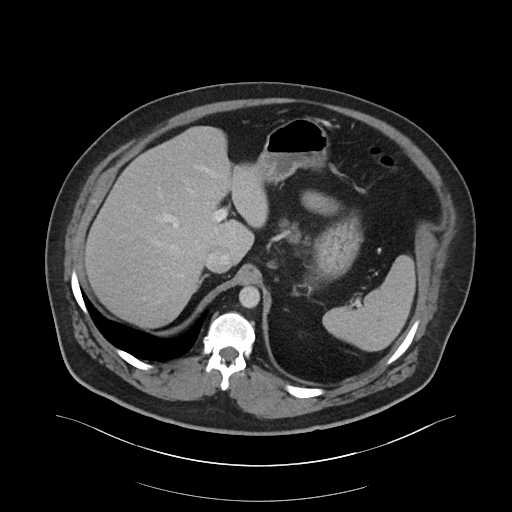
[im 93/139  soft-tissue]
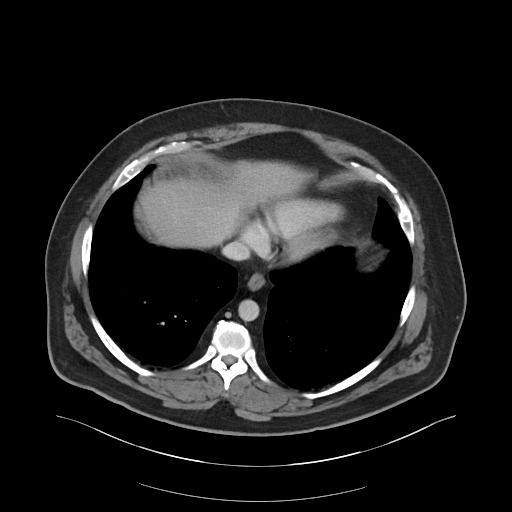
[im 104/139  soft-tissue]
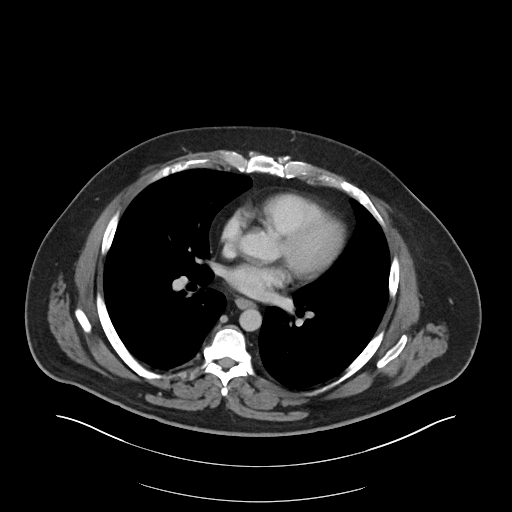
[im 104/139  bone]
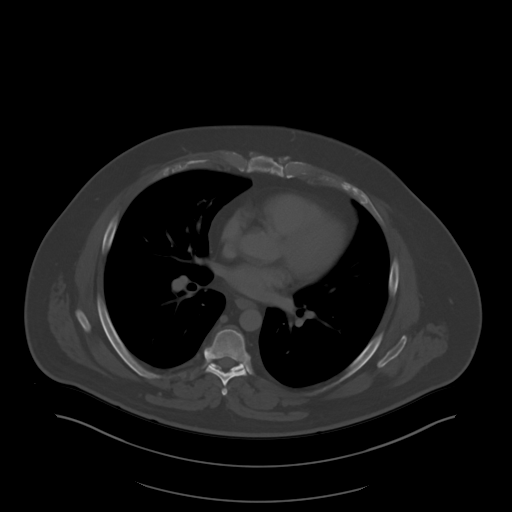
[im 116/139  soft-tissue]
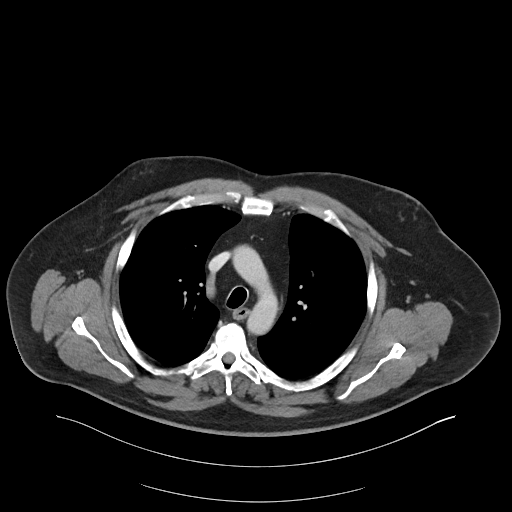
[im 127/139  soft-tissue]
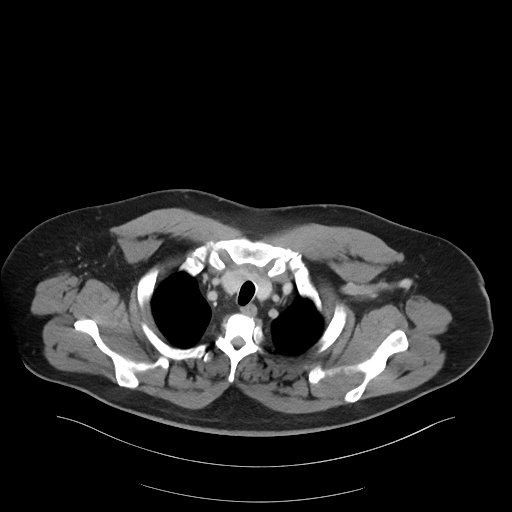

[Series 5: coronal · coronal · 1.06mm/px · 3 of 130 slices shown]
[im 44/130  soft-tissue]
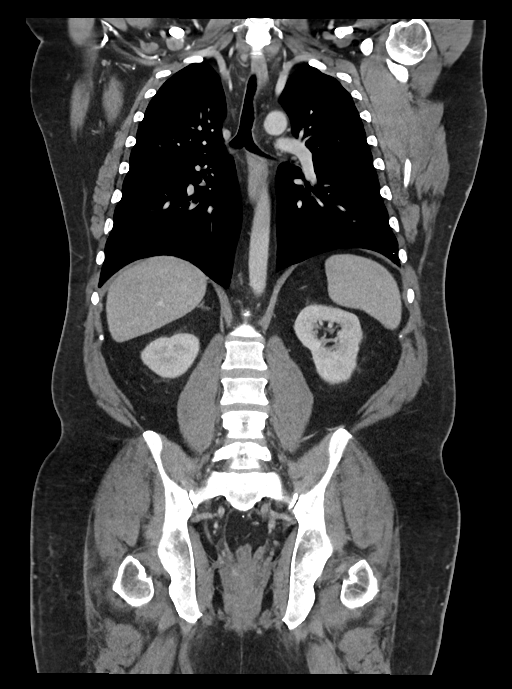
[im 58/130  soft-tissue]
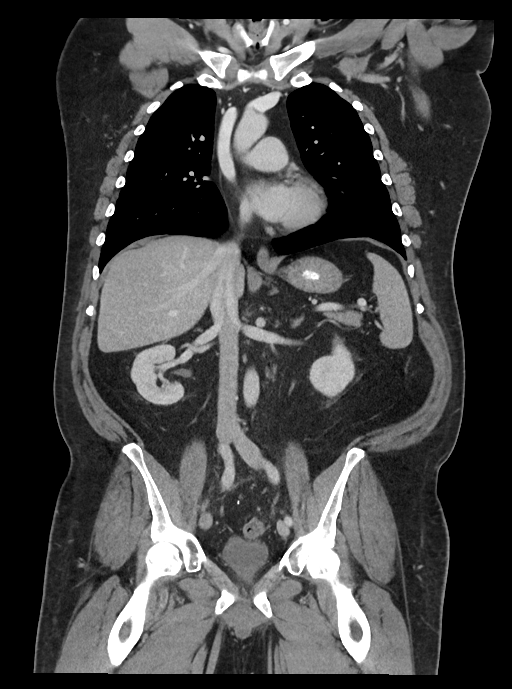
[im 72/130  soft-tissue]
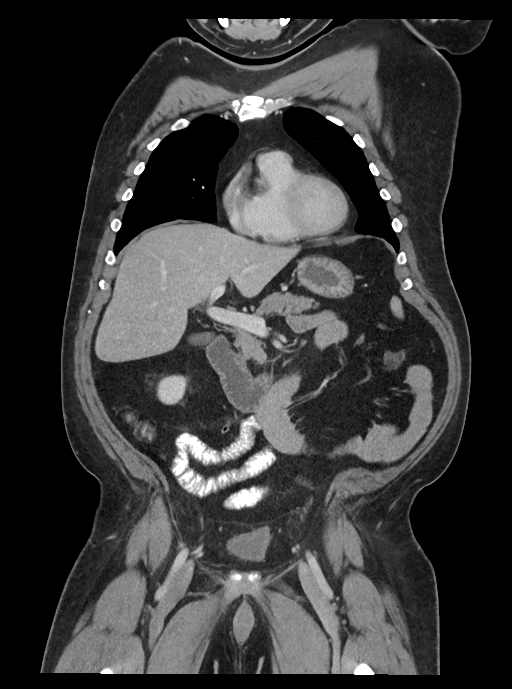

[14 of 46 positions shown; findings below may reference images not displayed]

RADIATION DOSE REDUCTION: This exam was performed according to the
departmental dose-optimization program which includes automated
exposure control, adjustment of the mA and/or kV according to
patient size and/or use of iterative reconstruction technique.

CONTRAST:  100mL OMNIPAQUE IOHEXOL 300 MG/ML  SOLN
FINDINGS: CT CHEST FINDINGS

Cardiovascular: The heart is normal in size. No pericardial
effusion. The aorta is normal in caliber. No dissection or
atherosclerotic calcification. No definite coronary artery
calcifications.

Mediastinum/Nodes: No mediastinal or hilar mass or lymphadenopathy.
The esophagus is normal. Thyroid gland is unremarkable.

Lungs/Pleura: No acute pulmonary findings. No pulmonary lesions or
pulmonary nodules to suggest metastatic disease.

Musculoskeletal: No chest wall mass, supraclavicular or axillary
adenopathy. The bony thorax is intact.

CT ABDOMEN PELVIS FINDINGS

Hepatobiliary: No hepatic lesions to suggest metastatic disease. No
intrahepatic biliary dilatation. The gallbladder is normal. No
common bile duct dilatation.

Pancreas: No mass, inflammation or ductal dilatation.

Spleen: Normal size. No focal lesions.

Adrenals/Urinary Tract: The adrenal glands are normal. Both kidneys
are normal. No renal lesions, renal calculi or hydronephrosis. The
bladder is unremarkable.

Stomach/Bowel: The stomach, duodenum, small bowel and colon are
unremarkable. No acute inflammatory changes, mass lesions or
obstructive findings. The terminal ileum and appendix are normal.
There is a diverting right lower quadrant loop ileostomy. No
complicating features are identified. The colon is unremarkable.
There are surgical changes in the pelvis from a prior low anterior
resection. Expected presacral soft tissue density but no findings
suspicious for perirectal mass. There is no adenopathy in the
ischial rectal fossa or in the sigmoid mesocolon.

Vascular/Lymphatic: The aorta is normal in caliber. No dissection.
The branch vessels are patent. The major venous structures are
patent. No mesenteric or retroperitoneal mass or adenopathy. Small
scattered lymph nodes are noted.

Reproductive: The prostate gland and seminal vesicles are
unremarkable.

Other: No free abdominal/free pelvic fluid collections. No
subcutaneous lesions. There is a small periumbilical abdominal wall
hernia containing fat. The inguinal canals containing moderate of
fat also.

Musculoskeletal: No significant bony findings.
IMPRESSION: 1. Surgical changes from a prior low anterior resection and
diverting right lower quadrant loop ileostomy without complicating
features.
2. No findings suspicious for perirectal mass, pelvic or abdominal
adenopathy or metastatic disease involving the chest or liver.

* Tracking Code: BO *

## 2022-03-03 MED ORDER — IOHEXOL 300 MG/ML  SOLN
100.0000 mL | Freq: Once | INTRAMUSCULAR | Status: AC | PRN
  Administered 2022-03-03: 100 mL via INTRAVENOUS

## 2022-03-06 ENCOUNTER — Telehealth: Payer: Self-pay

## 2022-03-06 NOTE — Telephone Encounter (Signed)
-----   Message from Eric Pier, MD sent at 03/05/2022  6:06 PM EDT ----- ?Please call patient, CTs are negative for cancer, f/u as scheduled ? ?

## 2022-03-06 NOTE — Telephone Encounter (Signed)
Pt verbalized understanding.

## 2022-03-10 ENCOUNTER — Encounter: Payer: Self-pay | Admitting: Neurology

## 2022-03-10 ENCOUNTER — Other Ambulatory Visit: Payer: Self-pay | Admitting: Neurology

## 2022-03-10 MED ORDER — BACLOFEN 10 MG PO TABS: 10.0000 mg | ORAL_TABLET | Freq: Two times a day (BID) | ORAL | 5 refills | Status: DC

## 2022-03-17 DIAGNOSIS — Z932 Ileostomy status: Secondary | ICD-10-CM | POA: Diagnosis not present

## 2022-03-29 ENCOUNTER — Inpatient Hospital Stay: Payer: BC Managed Care – PPO | Attending: Oncology | Admitting: Nurse Practitioner

## 2022-03-29 ENCOUNTER — Other Ambulatory Visit: Payer: BC Managed Care – PPO

## 2022-03-29 ENCOUNTER — Encounter: Payer: Self-pay | Admitting: Nurse Practitioner

## 2022-03-29 VITALS — BP 128/87 | HR 100 | Temp 98.2°F | Resp 18 | Ht 67.0 in | Wt 244.4 lb

## 2022-03-29 DIAGNOSIS — Z932 Ileostomy status: Secondary | ICD-10-CM | POA: Diagnosis not present

## 2022-03-29 DIAGNOSIS — R195 Other fecal abnormalities: Secondary | ICD-10-CM | POA: Insufficient documentation

## 2022-03-29 DIAGNOSIS — C20 Malignant neoplasm of rectum: Secondary | ICD-10-CM | POA: Diagnosis not present

## 2022-03-29 DIAGNOSIS — E119 Type 2 diabetes mellitus without complications: Secondary | ICD-10-CM | POA: Insufficient documentation

## 2022-03-29 DIAGNOSIS — L4050 Arthropathic psoriasis, unspecified: Secondary | ICD-10-CM | POA: Insufficient documentation

## 2022-03-29 DIAGNOSIS — Z8616 Personal history of COVID-19: Secondary | ICD-10-CM | POA: Diagnosis not present

## 2022-03-29 NOTE — Progress Notes (Signed)
  [place] OFFICE PROGRESS NOTE   Diagnosis: Rectal cancer  INTERVAL HISTORY:   Eric Lewis returns as scheduled.  Overall he feels well.  Left leg pain is better with gabapentin.  Ileostomy is functioning normally.  Stool is watery at times, mainly in the mornings.  He rarely requires Imodium.  He has a good appetite  Objective:  Vital signs in last 24 hours:  Blood pressure 128/87, pulse 100, temperature 98.2 F (36.8 C), temperature source Oral, resp. rate 18, height '5\' 7"'$  (1.702 m), weight 244 lb 6.4 oz (110.9 kg), SpO2 100 %.    Resp: Lungs clear bilaterally. Cardio: Regular rate and rhythm. GI: No hepatosplenomegaly.  Right abdomen ileostomy. Vascular: No leg edema. Neuro: Lower extremity motor strength 5/5.   Lab Results:  Lab Results  Component Value Date   WBC 6.4 09/15/2021   HGB 14.4 09/15/2021   HCT 44.0 09/15/2021   MCV 94.2 09/15/2021   PLT 243 09/15/2021   NEUTROABS 4.2 09/15/2021    Imaging:  No results found.  Medications: I have reviewed the patient's current medications.  Assessment/Plan: Rectal cancer Colonoscopy 02/15/2021- nonobstructing mass at 2-3 cm in the anal verge, removed in a piecemeal fashion, pathology confirmed moderately differentiated adenocarcinoma CTs chest, abdomen, and pelvis on 02/17/2021- no evidence of metastatic disease, no clear mass identified, areas of mild rectal wall thickening MRI 03/02/2021- rectal tumor not identified, no extension beyond the muscularis identified.  N1 disease with multiple left mesorectal nodes, and a 7 mm node beyond the mesorectum at the hypogastric region Cycle 1 FOLFOX 03/22/2021 Cycle 2 FOLFOX 04/05/2021 Cycle 3 FOLFOX 04/19/2021 Cycle 4 FOLFOX 05/03/2021 Cycle 5 FOLFOX 05/17/2021 Cycle 6 FOLFOX 05/31/2021 Cycle 7 FOLFOX 06/14/2021-Oxaliplatin infusion time lengthened, Benadryl and Pepcid added to premedications due to coughing, sneezing, throat clearing, diaphoresis during Oxaliplatin  cycle 6 Cycle 8 FOLFOX 06/28/2021 Radiation/Xeloda 07/12/2021-08/18/2021 MRI pelvis 08/25/2021 at [place] rectal tumor not definitely visualized with persistent irregular anterior rectal wall thickening and redemonstrated irregularity of the muscularis propria.  Decreased size of left mesorectal and left internal iliac lymph nodes. Low anterior resection and diverting loop ileostomy 10/20/2021,ypT0,ypN0, submucosal fibrosis and treatment effect without evidence of residual tumor MRI pelvis 12/20/2021-thick walled rim-enhancing gas and fluid collection in the presacral space communicating with the coloanal anastomosis consistent with a leak and small abscess MRI lumbar spine 01/08/2022-no abnormal enhancement of the vertebral bodies, conus medullaris, cauda equina, paraspinal structures   History of dysphagia-status post a barium swallow 10/22/2020- mild smooth stricture at the GE junction Psoriatic arthritis Diabetes COVID-19 January 22 Coughing, sneezing, throat clearing, diaphoresis during Oxaliplatin infusion cycle 6 FOLFOX Anaphylactic reaction to oxaliplatin 06/28/2021 Removal of malfunctioning Port-A-Cath 09/30/2021 Left calf muscle injury during surgery 10/20/2021-MRI of the left tibia/fibula-myonecrosis in the posterior muscle compartments      Disposition: Eric Lewis appears well.  He remains in clinical remission from rectal cancer.  The "leak test" has been rescheduled for July.  He will return for CEA and follow-up visit here in 8 weeks.  We are available to see him sooner if needed.    Ned Card ANP/GNP-BC   03/29/2022  2:35 PM

## 2022-04-04 DIAGNOSIS — C20 Malignant neoplasm of rectum: Secondary | ICD-10-CM | POA: Diagnosis not present

## 2022-04-04 DIAGNOSIS — Z932 Ileostomy status: Secondary | ICD-10-CM | POA: Diagnosis not present

## 2022-04-24 DIAGNOSIS — Z932 Ileostomy status: Secondary | ICD-10-CM | POA: Diagnosis not present

## 2022-05-08 DIAGNOSIS — E7800 Pure hypercholesterolemia, unspecified: Secondary | ICD-10-CM | POA: Diagnosis not present

## 2022-05-08 DIAGNOSIS — E1165 Type 2 diabetes mellitus with hyperglycemia: Secondary | ICD-10-CM | POA: Diagnosis not present

## 2022-05-08 DIAGNOSIS — Z23 Encounter for immunization: Secondary | ICD-10-CM | POA: Diagnosis not present

## 2022-05-08 DIAGNOSIS — I10 Essential (primary) hypertension: Secondary | ICD-10-CM | POA: Diagnosis not present

## 2022-05-15 DIAGNOSIS — M79672 Pain in left foot: Secondary | ICD-10-CM | POA: Diagnosis not present

## 2022-05-17 DIAGNOSIS — T83038A Leakage of other indwelling urethral catheter, initial encounter: Secondary | ICD-10-CM | POA: Diagnosis not present

## 2022-05-17 DIAGNOSIS — Z932 Ileostomy status: Secondary | ICD-10-CM | POA: Diagnosis not present

## 2022-05-17 DIAGNOSIS — Y848 Other medical procedures as the cause of abnormal reaction of the patient, or of later complication, without mention of misadventure at the time of the procedure: Secondary | ICD-10-CM | POA: Diagnosis not present

## 2022-05-22 ENCOUNTER — Inpatient Hospital Stay (HOSPITAL_BASED_OUTPATIENT_CLINIC_OR_DEPARTMENT_OTHER): Payer: BC Managed Care – PPO | Admitting: Oncology

## 2022-05-22 ENCOUNTER — Inpatient Hospital Stay: Payer: BC Managed Care – PPO | Attending: Oncology

## 2022-05-22 VITALS — BP 138/91 | HR 92 | Temp 98.1°F | Resp 18 | Ht 67.0 in | Wt 246.0 lb

## 2022-05-22 DIAGNOSIS — C20 Malignant neoplasm of rectum: Secondary | ICD-10-CM | POA: Insufficient documentation

## 2022-05-22 DIAGNOSIS — Z8616 Personal history of COVID-19: Secondary | ICD-10-CM | POA: Insufficient documentation

## 2022-05-22 DIAGNOSIS — E119 Type 2 diabetes mellitus without complications: Secondary | ICD-10-CM | POA: Insufficient documentation

## 2022-05-22 DIAGNOSIS — L4050 Arthropathic psoriasis, unspecified: Secondary | ICD-10-CM | POA: Insufficient documentation

## 2022-05-22 LAB — CEA (ACCESS): CEA (CHCC): 2.3 ng/mL (ref 0.00–5.00)

## 2022-05-22 NOTE — Progress Notes (Signed)
Eric Lewis   Diagnosis: Rectal cancer  INTERVAL HISTORY:   Eric Lewis returns as scheduled.  The left leg pain has improved and is controlled with gabapentin.  The ostomy output is variable in consistency.  He underwent a repeat Gastrografin enema at [facility] on 05/17/2022.Marland Kitchen  There is a persistent extension of contrast to the anterior aspect of the suture line consistent with an improved contained leak at the posterior aspect of the anastomosis.  He is scheduled for follow-up with surgery tomorrow.  He is working.  No other complaint.  Objective:  Vital signs in last 24 hours:  Blood pressure (!) 138/91, pulse 92, temperature 98.1 F (36.7 C), temperature source Oral, resp. rate 18, height '5\' 7"'$  (1.702 m), weight 246 lb (111.6 kg), SpO2 98 %.    HEENT: Neck without mass Lymphatics: No cervical, supraclavicular, axillary, or inguinal nodes Resp: Lungs clear bilaterally Cardio: Regular rate and rhythm GI: No hepatosplenomegaly, right abdomen ileostomy, no mass, nontender Vascular: No leg edema  Lab Results:  Lab Results  Component Value Date   WBC 6.4 09/15/2021   HGB 14.4 09/15/2021   HCT 44.0 09/15/2021   MCV 94.2 09/15/2021   PLT 243 09/15/2021   NEUTROABS 4.2 09/15/2021    CMP  Lab Results  Component Value Date   NA 137 03/03/2022   K 4.7 03/03/2022   CL 101 03/03/2022   CO2 26 03/03/2022   GLUCOSE 115 (H) 03/03/2022   BUN 12 03/03/2022   CREATININE 0.93 03/03/2022   CALCIUM 10.0 03/03/2022   PROT 6.9 09/15/2021   ALBUMIN 4.4 09/15/2021   AST 15 09/15/2021   ALT 16 09/15/2021   ALKPHOS 66 09/15/2021   BILITOT 0.6 09/15/2021   GFRNONAA >60 03/03/2022   GFRAA >60 05/30/2016    Lab Results  Component Value Date   CEA 2.30 05/22/2022   Medications: I have reviewed the patient's current medications.   Assessment/Plan:  Rectal cancer Colonoscopy 02/15/2021- nonobstructing mass at 2-3 cm in the anal verge, removed in a  piecemeal fashion, pathology confirmed moderately differentiated adenocarcinoma CTs chest, abdomen, and pelvis on 02/17/2021- no evidence of metastatic disease, no clear mass identified, areas of mild rectal wall thickening MRI 03/02/2021- rectal tumor not identified, no extension beyond the muscularis identified.  N1 disease with multiple left mesorectal nodes, and a 7 mm node beyond the mesorectum at the hypogastric region Cycle 1 FOLFOX 03/22/2021 Cycle 2 FOLFOX 04/05/2021 Cycle 3 FOLFOX 04/19/2021 Cycle 4 FOLFOX 05/03/2021 Cycle 5 FOLFOX 05/17/2021 Cycle 6 FOLFOX 05/31/2021 Cycle 7 FOLFOX 06/14/2021-Oxaliplatin infusion time lengthened, Benadryl and Pepcid added to premedications due to coughing, sneezing, throat clearing, diaphoresis during Oxaliplatin cycle 6 Cycle 8 FOLFOX 06/28/2021 Radiation/Xeloda 07/12/2021-08/18/2021 MRI pelvis 08/25/2021 at [place] rectal tumor not definitely visualized with persistent irregular anterior rectal wall thickening and redemonstrated irregularity of the muscularis propria.  Decreased size of left mesorectal and left internal iliac lymph nodes. Low anterior resection and diverting loop ileostomy 10/20/2021,ypT0,ypN0, submucosal fibrosis and treatment effect without evidence of residual tumor MRI pelvis 12/20/2021-thick walled rim-enhancing gas and fluid collection in the presacral space communicating with the coloanal anastomosis consistent with a leak and small abscess MRI lumbar spine 01/08/2022-no abnormal enhancement of the vertebral bodies, conus medullaris, cauda equina, paraspinal structures CTs 03/03/2022-   History of dysphagia-status post a barium swallow 10/22/2020- mild smooth stricture at the GE junction Psoriatic arthritis Diabetes COVID-19 January 22 Coughing, sneezing, throat clearing, diaphoresis during Oxaliplatin infusion cycle 6 FOLFOX Anaphylactic  reaction to oxaliplatin 06/28/2021 Removal of malfunctioning Port-A-Cath 09/30/2021 Left calf muscle  injury during surgery 10/20/2021-MRI of the left tibia/fibula-myonecrosis in the posterior muscle compartments      Disposition: Mr. Raczka is in clinical remission from rectal cancer.  He will return for an office visit and CEA in 4 months.  He will continue follow-up with Dr. Ronita Hipps for evaluation of the rectal anastomotic leak and planning for the ileostomy takedown. Betsy Coder, MD  05/22/2022  3:20 PM

## 2022-05-23 DIAGNOSIS — C20 Malignant neoplasm of rectum: Secondary | ICD-10-CM | POA: Diagnosis not present

## 2022-06-19 ENCOUNTER — Telehealth: Payer: Self-pay

## 2022-06-19 NOTE — Telephone Encounter (Signed)
TC to Pt after call to triage line 06/17/22  about ileostomy filling up with clear liquid . Pt concerned that he was dehydrated because he emptied bag almost every half an hour. Spoke with Pt who informed me he was ok. Figured out why he had the hight output. He stated he was eating a lot of life savers which is high in sorbitol problem is fixed. No other problems or concerns noted.

## 2022-07-28 DIAGNOSIS — Z932 Ileostomy status: Secondary | ICD-10-CM | POA: Diagnosis not present

## 2022-08-07 DIAGNOSIS — K9189 Other postprocedural complications and disorders of digestive system: Secondary | ICD-10-CM | POA: Diagnosis not present

## 2022-08-07 DIAGNOSIS — C20 Malignant neoplasm of rectum: Secondary | ICD-10-CM | POA: Diagnosis not present

## 2022-08-15 DIAGNOSIS — Z932 Ileostomy status: Secondary | ICD-10-CM | POA: Diagnosis not present

## 2022-08-24 DIAGNOSIS — L4050 Arthropathic psoriasis, unspecified: Secondary | ICD-10-CM | POA: Diagnosis not present

## 2022-08-24 DIAGNOSIS — L409 Psoriasis, unspecified: Secondary | ICD-10-CM | POA: Diagnosis not present

## 2022-08-24 DIAGNOSIS — Z79899 Other long term (current) drug therapy: Secondary | ICD-10-CM | POA: Diagnosis not present

## 2022-08-31 DIAGNOSIS — K9189 Other postprocedural complications and disorders of digestive system: Secondary | ICD-10-CM | POA: Diagnosis not present

## 2022-09-08 DIAGNOSIS — K632 Fistula of intestine: Secondary | ICD-10-CM | POA: Diagnosis not present

## 2022-09-08 DIAGNOSIS — R933 Abnormal findings on diagnostic imaging of other parts of digestive tract: Secondary | ICD-10-CM | POA: Diagnosis not present

## 2022-09-08 DIAGNOSIS — K624 Stenosis of anus and rectum: Secondary | ICD-10-CM | POA: Diagnosis not present

## 2022-09-08 DIAGNOSIS — E669 Obesity, unspecified: Secondary | ICD-10-CM | POA: Diagnosis not present

## 2022-09-08 DIAGNOSIS — E1140 Type 2 diabetes mellitus with diabetic neuropathy, unspecified: Secondary | ICD-10-CM | POA: Diagnosis not present

## 2022-09-08 DIAGNOSIS — Y832 Surgical operation with anastomosis, bypass or graft as the cause of abnormal reaction of the patient, or of later complication, without mention of misadventure at the time of the procedure: Secondary | ICD-10-CM | POA: Diagnosis not present

## 2022-09-08 DIAGNOSIS — K9189 Other postprocedural complications and disorders of digestive system: Secondary | ICD-10-CM | POA: Diagnosis not present

## 2022-09-08 DIAGNOSIS — Z85048 Personal history of other malignant neoplasm of rectum, rectosigmoid junction, and anus: Secondary | ICD-10-CM | POA: Diagnosis not present

## 2022-09-08 DIAGNOSIS — Z9889 Other specified postprocedural states: Secondary | ICD-10-CM | POA: Diagnosis not present

## 2022-09-08 DIAGNOSIS — C20 Malignant neoplasm of rectum: Secondary | ICD-10-CM | POA: Diagnosis not present

## 2022-09-08 DIAGNOSIS — Z888 Allergy status to other drugs, medicaments and biological substances status: Secondary | ICD-10-CM | POA: Diagnosis not present

## 2022-09-08 DIAGNOSIS — L4059 Other psoriatic arthropathy: Secondary | ICD-10-CM | POA: Diagnosis not present

## 2022-09-08 DIAGNOSIS — Z79899 Other long term (current) drug therapy: Secondary | ICD-10-CM | POA: Diagnosis not present

## 2022-09-08 DIAGNOSIS — Z7984 Long term (current) use of oral hypoglycemic drugs: Secondary | ICD-10-CM | POA: Diagnosis not present

## 2022-09-20 DIAGNOSIS — Z932 Ileostomy status: Secondary | ICD-10-CM | POA: Diagnosis not present

## 2022-09-21 ENCOUNTER — Inpatient Hospital Stay: Payer: BC Managed Care – PPO | Attending: Oncology

## 2022-09-21 ENCOUNTER — Encounter: Payer: Self-pay | Admitting: Nurse Practitioner

## 2022-09-21 ENCOUNTER — Inpatient Hospital Stay (HOSPITAL_BASED_OUTPATIENT_CLINIC_OR_DEPARTMENT_OTHER): Payer: BC Managed Care – PPO | Admitting: Nurse Practitioner

## 2022-09-21 VITALS — BP 143/92 | HR 99 | Temp 98.0°F | Resp 18 | Wt 240.2 lb

## 2022-09-21 DIAGNOSIS — L4050 Arthropathic psoriasis, unspecified: Secondary | ICD-10-CM | POA: Diagnosis not present

## 2022-09-21 DIAGNOSIS — Z8616 Personal history of COVID-19: Secondary | ICD-10-CM | POA: Diagnosis not present

## 2022-09-21 DIAGNOSIS — C20 Malignant neoplasm of rectum: Secondary | ICD-10-CM | POA: Insufficient documentation

## 2022-09-21 DIAGNOSIS — E119 Type 2 diabetes mellitus without complications: Secondary | ICD-10-CM | POA: Diagnosis not present

## 2022-09-21 LAB — CEA (ACCESS): CEA (CHCC): 2.38 ng/mL (ref 0.00–5.00)

## 2022-09-21 NOTE — Progress Notes (Signed)
  [place] OFFICE PROGRESS NOTE   Diagnosis: Rectal cancer  INTERVAL HISTORY:   Eric Lewis returns as scheduled.  He underwent a flexible sigmoidoscopy 09/08/2022-patent end-to-end ileorectal anastomosis characterized by posterior leak; the leak site was treated with argon plasma coagulation; X tac device placed.  Gastrografin enema planned.  Colostomy functioning normally.  Some mucousy discharge from the rectum since the above procedure.  No nausea or vomiting.  No abdominal pain.  No numbness or tingling in the hands or right foot.  Continued intermittent pain left foot.  Objective:  Vital signs in last 24 hours:  Blood pressure (!) 143/92, pulse 99, temperature 98 F (36.7 C), temperature source Tympanic, resp. rate 18, weight 240 lb 3.2 oz (109 kg), SpO2 98 %.     Lymphatics: No palpable cervical, supraclavicular, axillary or inguinal lymph nodes. Resp: Lungs clear bilaterally. Cardio: Regular rate and rhythm. GI: No hepatosplenomegaly.  Right abdomen ileostomy.  No mass. Vascular: No leg edema.   Lab Results:  Lab Results  Component Value Date   WBC 6.4 09/15/2021   HGB 14.4 09/15/2021   HCT 44.0 09/15/2021   MCV 94.2 09/15/2021   PLT 243 09/15/2021   NEUTROABS 4.2 09/15/2021    Imaging:  No results found.  Medications: I have reviewed the patient's current medications.  Assessment/Plan: Rectal cancer Colonoscopy 02/15/2021- nonobstructing mass at 2-3 cm in the anal verge, removed in a piecemeal fashion, pathology confirmed moderately differentiated adenocarcinoma CTs chest, abdomen, and pelvis on 02/17/2021- no evidence of metastatic disease, no clear mass identified, areas of mild rectal wall thickening MRI 03/02/2021- rectal tumor not identified, no extension beyond the muscularis identified.  N1 disease with multiple left mesorectal nodes, and a 7 mm node beyond the mesorectum at the hypogastric region Cycle 1 FOLFOX 03/22/2021 Cycle 2 FOLFOX  04/05/2021 Cycle 3 FOLFOX 04/19/2021 Cycle 4 FOLFOX 05/03/2021 Cycle 5 FOLFOX 05/17/2021 Cycle 6 FOLFOX 05/31/2021 Cycle 7 FOLFOX 06/14/2021-Oxaliplatin infusion time lengthened, Benadryl and Pepcid added to premedications due to coughing, sneezing, throat clearing, diaphoresis during Oxaliplatin cycle 6 Cycle 8 FOLFOX 06/28/2021 Radiation/Xeloda 07/12/2021-08/18/2021 MRI pelvis 08/25/2021 at [place] rectal tumor not definitely visualized with persistent irregular anterior rectal wall thickening and redemonstrated irregularity of the muscularis propria.  Decreased size of left mesorectal and left internal iliac lymph nodes. Low anterior resection and diverting loop ileostomy 10/20/2021,ypT0,ypN0, submucosal fibrosis and treatment effect without evidence of residual tumor MRI pelvis 12/20/2021-thick walled rim-enhancing gas and fluid collection in the presacral space communicating with the coloanal anastomosis consistent with a leak and small abscess MRI lumbar spine 01/08/2022-no abnormal enhancement of the vertebral bodies, conus medullaris, cauda equina, paraspinal structures CTs 03/03/2022-no metastatic disease.   History of dysphagia-status post a barium swallow 10/22/2020- mild smooth stricture at the GE junction Psoriatic arthritis Diabetes COVID-19 January 22 Coughing, sneezing, throat clearing, diaphoresis during Oxaliplatin infusion cycle 6 FOLFOX Anaphylactic reaction to oxaliplatin 06/28/2021 Removal of malfunctioning Port-A-Cath 09/30/2021 Left calf muscle injury during surgery 10/20/2021-MRI of the left tibia/fibula-myonecrosis in the posterior muscle compartments      Disposition: Eric Lewis remains in clinical remission from rectal cancer.  We will follow-up on the CEA from today.  Plan for CEA and CT scans in 4 to 5 months.  We will see him in follow-up a few days later.  He continues follow-up at [facility] regarding the rectal anastomotic leak, ileostomy takedown.    Ned Card  ANP/GNP-BC   09/21/2022  1:55 PM

## 2022-10-26 DIAGNOSIS — Z932 Ileostomy status: Secondary | ICD-10-CM | POA: Diagnosis not present

## 2022-10-26 DIAGNOSIS — Z48815 Encounter for surgical aftercare following surgery on the digestive system: Secondary | ICD-10-CM | POA: Diagnosis not present

## 2022-10-31 DIAGNOSIS — Z932 Ileostomy status: Secondary | ICD-10-CM | POA: Diagnosis not present

## 2023-01-22 ENCOUNTER — Inpatient Hospital Stay: Payer: Medicaid Other | Attending: Oncology

## 2023-01-22 ENCOUNTER — Encounter (HOSPITAL_BASED_OUTPATIENT_CLINIC_OR_DEPARTMENT_OTHER): Payer: Self-pay

## 2023-01-22 ENCOUNTER — Ambulatory Visit (HOSPITAL_BASED_OUTPATIENT_CLINIC_OR_DEPARTMENT_OTHER)
Admission: RE | Admit: 2023-01-22 | Discharge: 2023-01-22 | Disposition: A | Discharge status: Home / Self Care | Payer: Medicaid Other | Source: Ambulatory Visit | Attending: Nurse Practitioner | Admitting: Nurse Practitioner

## 2023-01-22 DIAGNOSIS — C20 Malignant neoplasm of rectum: Secondary | ICD-10-CM | POA: Insufficient documentation

## 2023-01-22 DIAGNOSIS — Z8616 Personal history of COVID-19: Secondary | ICD-10-CM | POA: Insufficient documentation

## 2023-01-22 DIAGNOSIS — E119 Type 2 diabetes mellitus without complications: Secondary | ICD-10-CM | POA: Insufficient documentation

## 2023-01-22 DIAGNOSIS — L4050 Arthropathic psoriasis, unspecified: Secondary | ICD-10-CM | POA: Insufficient documentation

## 2023-01-22 LAB — BASIC METABOLIC PANEL - CANCER CENTER ONLY
Anion gap: 4 — ABNORMAL LOW (ref 5–15)
BUN: 11 mg/dL (ref 6–20)
CO2: 30 mmol/L (ref 22–32)
Calcium: 9.6 mg/dL (ref 8.9–10.3)
Chloride: 104 mmol/L (ref 98–111)
Creatinine: 0.93 mg/dL (ref 0.61–1.24)
GFR, Estimated: >60 mL/min (ref >60)
Glucose, Bld: 139 mg/dL — ABNORMAL HIGH (ref 70–99)
Potassium: 4.9 mmol/L (ref 3.5–5.1)
Sodium: 138 mmol/L (ref 135–145)

## 2023-01-22 LAB — CEA (ACCESS): CEA (CHCC): 2.69 ng/mL (ref 0.00–5.00)

## 2023-01-22 MED ORDER — IOHEXOL 300 MG/ML  SOLN
100.0000 mL | Freq: Once | INTRAMUSCULAR | Status: AC | PRN
  Administered 2023-01-22: 100 mL via INTRAVENOUS

## 2023-01-25 ENCOUNTER — Inpatient Hospital Stay: Payer: Medicaid Other | Admitting: Oncology

## 2023-01-25 VITALS — BP 138/82 | HR 83 | Temp 98.2°F | Resp 18 | Ht 67.0 in | Wt 243.0 lb

## 2023-01-25 DIAGNOSIS — Z8616 Personal history of COVID-19: Secondary | ICD-10-CM | POA: Diagnosis not present

## 2023-01-25 DIAGNOSIS — L4050 Arthropathic psoriasis, unspecified: Secondary | ICD-10-CM | POA: Diagnosis not present

## 2023-01-25 DIAGNOSIS — C20 Malignant neoplasm of rectum: Secondary | ICD-10-CM

## 2023-01-25 DIAGNOSIS — E119 Type 2 diabetes mellitus without complications: Secondary | ICD-10-CM | POA: Diagnosis not present

## 2023-01-25 NOTE — Progress Notes (Signed)
[place] OFFICE PROGRESS NOTE   Diagnosis: Rectal cancer  INTERVAL HISTORY:   Eric Lewis returns as scheduled.  He feels well.  He continues to have left foot pain, relieved with gabapentin.  The ileostomy remains in place.  The stool is variably formed and liquid.  He is scheduled for a sigmoidoscopy tomorrow.  A Gastrografin enema on 12/20/2022 revealed no evidence of a leak at the rectosigmoid anastomosis.  Objective:  Vital signs in last 24 hours:  Blood pressure 138/82, pulse 83, temperature 98.2 F (36.8 C), resp. rate 18, height 5\' 7"  (1.702 m), weight 243 lb (110.2 kg), SpO2 98 %.     Lymphatics: No cervical, supraclavicular, axillary, or inguinal nodes Resp: Lungs clear bilaterally Cardio: Regular rate and rhythm GI: No hepatosplenomegaly, nontender, no mass, reducible umbilical hernia, right abdomen ileostomy with formed brown stool Vascular: No leg edema  Lab Results:  Lab Results  Component Value Date   WBC 6.4 09/15/2021   HGB 14.4 09/15/2021   HCT 44.0 09/15/2021   MCV 94.2 09/15/2021   PLT 243 09/15/2021   NEUTROABS 4.2 09/15/2021    CMP  Lab Results  Component Value Date   NA 138 01/22/2023   K 4.9 01/22/2023   CL 104 01/22/2023   CO2 30 01/22/2023   GLUCOSE 139 (H) 01/22/2023   BUN 11 01/22/2023   CREATININE 0.93 01/22/2023   CALCIUM 9.6 01/22/2023   PROT 6.9 09/15/2021   ALBUMIN 4.4 09/15/2021   AST 15 09/15/2021   ALT 16 09/15/2021   ALKPHOS 66 09/15/2021   BILITOT 0.6 09/15/2021   GFRNONAA >60 01/22/2023   GFRAA >60 05/30/2016    Lab Results  Component Value Date   CEA 2.69 01/22/2023    No results found for: "INR", "LABPROT"  Imaging:  CT CHEST ABDOMEN PELVIS W CONTRAST  Result Date: 01/23/2023 CLINICAL DATA:  Follow-up rectal carcinoma.  * Tracking Code: BO * EXAM: CT CHEST, ABDOMEN, AND PELVIS WITH CONTRAST TECHNIQUE: Multidetector CT imaging of the chest, abdomen and pelvis was performed following the  standard protocol during bolus administration of intravenous contrast. RADIATION DOSE REDUCTION: This exam was performed according to the departmental dose-optimization program which includes automated exposure control, adjustment of the mA and/or kV according to patient size and/or use of iterative reconstruction technique. CONTRAST:  155mL OMNIPAQUE IOHEXOL 300 MG/ML  SOLN COMPARISON:  03/03/2022 FINDINGS: CT CHEST FINDINGS Cardiovascular: No acute findings. Mediastinum/Lymph Nodes: No masses or pathologically enlarged lymph nodes identified. Lungs/Pleura: No suspicious pulmonary nodules or masses identified. No evidence of infiltrate or pleural effusion. Musculoskeletal:  No suspicious bone lesions identified. CT ABDOMEN AND PELVIS FINDINGS Hepatobiliary: No masses identified. Pancreas:  No mass or inflammatory changes. Spleen:  Within normal limits in size and appearance. Adrenals/Urinary tract: No suspicious masses or hydronephrosis. Stomach/Bowel: Right abdomen again noted. Stable presacral soft tissue density with calcification, consistent with post treatment changes. No new or enlarging mass identified. No evidence of obstruction, inflammatory process, or abnormal fluid collections. Vascular/Lymphatic: No pathologically enlarged lymph nodes identified. No acute vascular findings. Reproductive:  No mass or other significant abnormality identified. Other:  Small umbilical hernia again seen, which contains only fat. Musculoskeletal:  No suspicious bone lesions identified. IMPRESSION: Stable postsurgical changes in presacral space. No evidence of recurrent or metastatic carcinoma. Electronically Signed   By: Marlaine Hind M.D.   On: 01/23/2023 15:23    Medications: I have reviewed the patient's current medications.   Assessment/Plan: Rectal cancer Colonoscopy 02/15/2021- nonobstructing mass  at 2-3 cm in the anal verge, removed in a piecemeal fashion, pathology confirmed moderately differentiated  adenocarcinoma CTs chest, abdomen, and pelvis on 02/17/2021- no evidence of metastatic disease, no clear mass identified, areas of mild rectal wall thickening MRI 03/02/2021- rectal tumor not identified, no extension beyond the muscularis identified.  N1 disease with multiple left mesorectal nodes, and a 7 mm node beyond the mesorectum at the hypogastric region Cycle 1 FOLFOX 03/22/2021 Cycle 2 FOLFOX 04/05/2021 Cycle 3 FOLFOX 04/19/2021 Cycle 4 FOLFOX 05/03/2021 Cycle 5 FOLFOX 05/17/2021 Cycle 6 FOLFOX 05/31/2021 Cycle 7 FOLFOX 06/14/2021-Oxaliplatin infusion time lengthened, Benadryl and Pepcid added to premedications due to coughing, sneezing, throat clearing, diaphoresis during Oxaliplatin cycle 6 Cycle 8 FOLFOX 06/28/2021 Radiation/Xeloda 07/12/2021-08/18/2021 MRI pelvis 08/25/2021 at [place] rectal tumor not definitely visualized with persistent irregular anterior rectal wall thickening and redemonstrated irregularity of the muscularis propria.  Decreased size of left mesorectal and left internal iliac lymph nodes. Low anterior resection and diverting loop ileostomy 10/20/2021,ypT0,ypN0, submucosal fibrosis and treatment effect without evidence of residual tumor MRI pelvis 12/20/2021-thick walled rim-enhancing gas and fluid collection in the presacral space communicating with the coloanal anastomosis consistent with a leak and small abscess MRI lumbar spine 01/08/2022-no abnormal enhancement of the vertebral bodies, conus medullaris, cauda equina, paraspinal structures CTs 03/03/2022-no metastatic disease. CTs 01/22/2023-no evidence of recurrent disease   History of dysphagia-status post a barium swallow 10/22/2020- mild smooth stricture at the GE junction Psoriatic arthritis Diabetes COVID-19 January 22 Coughing, sneezing, throat clearing, diaphoresis during Oxaliplatin infusion cycle 6 FOLFOX Anaphylactic reaction to oxaliplatin 06/28/2021 Removal of malfunctioning Port-A-Cath 09/30/2021 Left calf  muscle injury during surgery 10/20/2021-MRI of the left tibia/fibula-myonecrosis in the posterior muscle compartments Rectal anastomotic leak-Gastrografin enema 12/20/2022-no leak       Disposition: [name] is in clinical remission from rectal cancer.  He continues gabapentin for treatment of nerve injury causing left foot pain.  The ileostomy remains in place.  He is scheduled for follow-up with Dr.Migaly Orland Mustard for a sigmoidoscopy.  Hopefully he will be able to undergo ileostomy reversal within the next few months.  Mr. Gean will return for an office visit and CEA in 6 months.  Betsy Coder, MD  01/25/2023  12:27 PM

## 2023-03-27 HISTORY — PX: ILEOSTOMY CLOSURE: SHX1784

## 2023-07-26 ENCOUNTER — Inpatient Hospital Stay: Payer: Medicaid Other | Attending: Oncology

## 2023-07-26 ENCOUNTER — Inpatient Hospital Stay: Payer: Medicaid Other | Admitting: Oncology

## 2023-07-26 VITALS — BP 115/76 | HR 91 | Temp 98.2°F | Resp 18 | Ht 67.0 in | Wt 190.0 lb

## 2023-07-26 DIAGNOSIS — R152 Fecal urgency: Secondary | ICD-10-CM | POA: Diagnosis not present

## 2023-07-26 DIAGNOSIS — E119 Type 2 diabetes mellitus without complications: Secondary | ICD-10-CM | POA: Diagnosis not present

## 2023-07-26 DIAGNOSIS — L4050 Arthropathic psoriasis, unspecified: Secondary | ICD-10-CM | POA: Insufficient documentation

## 2023-07-26 DIAGNOSIS — Z8616 Personal history of COVID-19: Secondary | ICD-10-CM | POA: Diagnosis not present

## 2023-07-26 DIAGNOSIS — C20 Malignant neoplasm of rectum: Secondary | ICD-10-CM | POA: Insufficient documentation

## 2023-07-26 LAB — CEA (ACCESS): CEA (CHCC): 1.54 ng/mL (ref 0.00–5.00)

## 2023-07-26 NOTE — Progress Notes (Signed)
[facility] OFFICE PROGRESS NOTE   Diagnosis: Rectal cancer  INTERVAL HISTORY:   [name] returns as scheduled.  He underwent ileostomy reversal on 03/27/2023.  He reports frequent bowel movements, up to 20 times daily since the ileostomy was reversed.  The stool is now more formed and he feels Imodium helps the stool frequency.  He is followed by the surgical service at [facility] and was prescribed Lomotil yesterday.  He has occasional rectal bleeding.  He continues to have pain in the left foot, relieved with gabapentin and position change.  He has intermittent numbness in the hands when the hands that is relieved with changing position.  No hand weakness.  He reports intentional weight loss beginning in March of this year.  He eats twice daily and has changed his diet.  Objective:  Vital signs in last 24 hours:  Blood pressure 115/76, pulse 91, temperature 98.2 F (36.8 C), temperature source Temporal, resp. rate 18, height 5\' 7"  (1.702 m), weight 190 lb (86.2 kg), SpO2 98%.    Lymphatics: No cervical, supraclavicular, axillary, or inguinal nodes Resp: Lungs clear bilaterally Cardio: Regular rate and rhythm GI: No hepatosplenomegaly, no mass, nontender, healing ileostomy site Vascular: No leg edema     Lab Results:  Lab Results  Component Value Date   WBC 6.4 09/15/2021   HGB 14.4 09/15/2021   HCT 44.0 09/15/2021   MCV 94.2 09/15/2021   PLT 243 09/15/2021   NEUTROABS 4.2 09/15/2021    CMP  Lab Results  Component Value Date   NA 138 01/22/2023   K 4.9 01/22/2023   CL 104 01/22/2023   CO2 30 01/22/2023   GLUCOSE 139 (H) 01/22/2023   BUN 11 01/22/2023   CREATININE 0.93 01/22/2023   CALCIUM 9.6 01/22/2023   PROT 6.9 09/15/2021   ALBUMIN 4.4 09/15/2021   AST 15 09/15/2021   ALT 16 09/15/2021   ALKPHOS 66 09/15/2021   BILITOT 0.6 09/15/2021   GFRNONAA >60 01/22/2023   GFRAA >60 05/30/2016    Lab Results  Component Value Date   CEA 1.54  07/26/2023     Medications: I have reviewed the patient's current medications.   Assessment/Plan: Rectal cancer Colonoscopy 02/15/2021- nonobstructing mass at 2-3 cm in the anal verge, removed in a piecemeal fashion, pathology confirmed moderately differentiated adenocarcinoma CTs chest, abdomen, and pelvis on 02/17/2021- no evidence of metastatic disease, no clear mass identified, areas of mild rectal wall thickening MRI 03/02/2021- rectal tumor not identified, no extension beyond the muscularis identified.  N1 disease with multiple left mesorectal nodes, and a 7 mm node beyond the mesorectum at the hypogastric region Cycle 1 FOLFOX 03/22/2021 Cycle 2 FOLFOX 04/05/2021 Cycle 3 FOLFOX 04/19/2021 Cycle 4 FOLFOX 05/03/2021 Cycle 5 FOLFOX 05/17/2021 Cycle 6 FOLFOX 05/31/2021 Cycle 7 FOLFOX 06/14/2021-Oxaliplatin infusion time lengthened, Benadryl and Pepcid added to premedications due to coughing, sneezing, throat clearing, diaphoresis during Oxaliplatin cycle 6 Cycle 8 FOLFOX 06/28/2021 Radiation/Xeloda 07/12/2021-08/18/2021 MRI pelvis 08/25/2021 at [facility] rectal tumor not definitely visualized with persistent irregular anterior rectal wall thickening and redemonstrated irregularity of the muscularis propria.  Decreased size of left mesorectal and left internal iliac lymph nodes. Low anterior resection and diverting loop ileostomy 10/20/2021,ypT0,ypN0, submucosal fibrosis and treatment effect without evidence of residual tumor MRI pelvis 12/20/2021-thick walled rim-enhancing gas and fluid collection in the presacral space communicating with the coloanal anastomosis consistent with a leak and small abscess MRI lumbar spine 01/08/2022-no abnormal enhancement of the vertebral bodies, conus medullaris, cauda equina,  paraspinal structures CTs 03/03/2022-no metastatic disease. CTs 01/22/2023-no evidence of recurrent disease   History of dysphagia-status post a barium swallow 10/22/2020- mild smooth stricture at  the GE junction Psoriatic arthritis Diabetes COVID-19 January 22 Coughing, sneezing, throat clearing, diaphoresis during Oxaliplatin infusion cycle 6 FOLFOX Anaphylactic reaction to oxaliplatin 06/28/2021 Removal of malfunctioning Port-A-Cath 09/30/2021 Left calf muscle injury during surgery 10/20/2021-MRI of the left tibia/fibula-myonecrosis in the posterior muscle compartments History of rectal anastomotic leak-Gastrografin enema 12/20/2022-no leak Ileostomy takedown 03/27/2023        Disposition: [name] is in clinical remission from rectal cancer.  He underwent ileostomy takedown in May.  He has frequent bowel movements following the ileostomy takedown procedure.  He is followed by the surgical service at [facility] using Metamucil, Imodium, and Lomotil.  Hopefully the stool frequency will improve with time and medical therapy.  I will refer him to Dr. Marca Lewis for a surveillance colonoscopy.  [name] will return for an office visit and surveillance CTs in 6 months.  Thornton Papas, MD  07/26/2023  12:31 PM

## 2023-08-17 ENCOUNTER — Telehealth: Payer: Self-pay | Admitting: *Deleted

## 2023-08-23 NOTE — Progress Notes (Signed)
Office Visit Note  Patient: Eric Lewis             Date of Birth: [birth year]           MRN: [id number]             PCP: Darrow Bussing, MD Referring: Darrow Bussing, MD Visit Date: 08/31/2023 Occupation: @GUAROCC @  Subjective:  Pain in multiple joints and psoriasis  History of Present Illness: Eric Lewis is a 52 y.o. male seen in consultation per request of his PCP.  According the patient he started developing symptoms of psoriasis in his scalp in 2017.  At the same time he started having pain and swelling in his ankle joints.  He was referred to [facility] rheumatology where he had extensive workup and was diagnosed with psoriatic arthritis.  He recalls also having some discomfort in his elbows and wrist joints at the time.  He denies any history of Achilles tendinitis, plantar fasciitis or uveitis.  He was started on [place] in 2017 by [facility] rheumatology as he did not like any injectable medications.  He states Henderson [place] worked well for him his psoriasis cleared up and his joint symptoms resolved.  He has been taking [place] without any interruption since then.  Due to change in his insurance he could not go back to [facility] rheumatology since October 2023.  He also developed a flare of psoriasis on his knuckles in November 2023 which responded to topical agents.  He has history of rectal cancer and since the surgery he has been having chronic diarrhea.  He recently decided to come off metformin and Otezla and diarrhea stopped.  He reintroduced metformin ER did not make worsening of the diarrhea.  When he stopped [place] the diarrhea has stopped and recurred after resuming [place].  Patient would like to stop Otezla and switch to some other medication due to ongoing diarrhea.  He has been off [place] for a week now.  Currently does not have any active psoriasis or joint inflammation.  There is no family history of psoriasis.  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  He has not been  exercising much.  He has been modifying his diet and has lost 50 pounds over the last 6 months.  He has 2 siblings who are in good health.  He does not have any children.    Activities of Daily Living:  Patient reports morning stiffness for 0 minutes.   Patient Denies nocturnal pain.  Difficulty dressing/grooming: Denies Difficulty climbing stairs: Denies Difficulty getting out of chair: Denies Difficulty using hands for taps, buttons, cutlery, and/or writing: Denies  Review of Systems  Constitutional:  Negative for fatigue.  HENT:  Negative for mouth sores and mouth dryness.   Eyes:  Negative for dryness.  Respiratory:  Negative for shortness of breath.   Cardiovascular:  Negative for chest pain and palpitations.  Gastrointestinal:  Positive for diarrhea. Negative for blood in stool, constipation, nausea and vomiting.  Endocrine: Negative for increased urination.  Genitourinary:  Negative for involuntary urination.  Musculoskeletal:  Negative for joint pain, gait problem, joint pain, joint swelling, myalgias, muscle weakness, morning stiffness, muscle tenderness and myalgias.  Skin:  Negative for color change, rash, hair loss and sensitivity to sunlight.  Allergic/Immunologic: Negative for susceptible to infections.  Neurological:  Positive for numbness. Negative for dizziness and headaches.  Hematological:  Negative for swollen glands.  Psychiatric/Behavioral:  Negative for depressed mood and sleep disturbance. The patient is not nervous/anxious.  PMFS History:  Patient Active Problem List   Diagnosis Date Noted   Encounter for antineoplastic chemotherapy 05/03/2021   Genetic testing 03/20/2021   Family history of breast cancer    Family history of prostate cancer    Adenocarcinoma of rectum (HCC) 02/24/2021   Septic arthritis of knee, right (HCC) 09/30/2014   Diabetes mellitus (HCC) 09/30/2014   Dyslipidemia 09/30/2014   Obesity 09/30/2014    Past Medical History:   Diagnosis Date   COVID jan 30 th 2022   fever x 4 days all sx resolved   DM type 2 (diabetes mellitus, type 2) (HCC)    Family history of breast cancer    Family history of prostate cancer    History of hypertension    no meds x 1 year due to weight loss as of 03-16-2021   Hypercholesteremia    Obesity    Psoriatic arthritis (HCC)    Rectal cancer (HCC)    Umbilical hernia     Family History  Problem Relation Age of Onset   Emphysema Father    Prostate cancer Paternal Uncle 40   Emphysema Maternal Grandmother    Breast cancer Paternal Grandmother        dx 31s   Past Surgical History:  Procedure Laterality Date   CHONDROPLASTY Right 09/28/2014   Procedure: CHONDROPLASTY, PATELLA/FEMORAL;  Surgeon: Harvie Junior, MD;  Location: [facility];  Service: Orthopedics;  Laterality: Right;   colonscopy  03-17-2021   [facility]   ILEOSTOMY CLOSURE  03/27/2023   IR CV LINE INJECTION  09/23/2021   IR REMOVAL TUN ACCESS W/ PORT W/O FL MOD SED  09/30/2021   KNEE ARTHROSCOPY WITH MEDIAL MENISECTOMY Right 09/28/2014   Procedure: KNEE ARTHROSCOPY WITH MEDIAL MENISECTOMY;  Surgeon: Harvie Junior, MD;  Location: [facility];  Service: Orthopedics;  Laterality: Right;   PORTACATH PLACEMENT Right 03/18/2021   Procedure: PLACEMENT PORT-A-CATH;  Surgeon: Andria Meuse, MD;  Location: [facility];  Service: General;  Laterality: Right;   ROBOTIC COLECTOMY, PARTIAL, WITH ANASTOMOSIS, WITH COLOPROCTOSTOMY (LOW PELVIC ANASTOMOSIS) WITH IIEOSTOMY  10/20/2021   [facility]   SYNOVECTOMY Right 09/28/2014   Procedure: SYNOVECTOMY, THREE COMPARTMENT;  Surgeon: Harvie Junior, MD;  Location: [facility];  Service: Orthopedics;  Laterality: Right;   Social History   Social History Narrative   Reports he is the middle child of two other siblings. Lives alone and works from home in [employer].      Right Handed    Lives in a two story home     Immunization History  Administered Date(s) Administered   Influenza Split 07/27/2014   PFIZER(Purple Top)SARS-COV-2 Vaccination 02/05/2020, 03/01/2020   Pneumococcal Polysaccharide-23 12/04/2014   Tdap 09/09/2012, 05/08/2022     Objective: Vital Signs: BP 117/77 (BP Location: Right Arm, Patient Position: Sitting, Cuff Size: Normal)   Pulse 77   Resp 15   Ht 5\' 7"  (1.702 m)   Wt 188 lb 6.4 oz (85.5 kg)   BMI 29.51 kg/m    Physical Exam Vitals and nursing note reviewed.  Constitutional:      Appearance: He is well-developed.  HENT:     Head: Normocephalic and atraumatic.  Eyes:     Conjunctiva/sclera: Conjunctivae normal.     Pupils: Pupils are equal, round, and reactive to light.  Cardiovascular:     Rate and Rhythm: Normal rate and regular rhythm.     Heart sounds: Normal heart sounds.  Pulmonary:     Effort: Pulmonary effort is normal.     Breath sounds: Normal breath sounds.  Abdominal:     General: Bowel sounds are normal.     Palpations: Abdomen is soft.  Musculoskeletal:     Cervical back: Normal range of motion and neck supple.  Skin:    General: Skin is warm and dry.     Capillary Refill: Capillary refill takes less than 2 seconds.  Neurological:     Mental Status: He is alert and oriented to person, place, and time.  Psychiatric:        Behavior: Behavior normal.      Musculoskeletal Exam: He had limited lateral rotation of the cervical spine without discomfort.  There was no tenderness over thoracic or lumbar spine.  There was no tenderness over SI joints.  Shoulder joints were in good range of motion.  He had contracture in bilateral elbow joints without any synovitis.  He has some limitation with extension of the wrist joints but no synovitis was noted.  PIP and DIP thickening was noted.  Flexion contracture of the left fourth DIP was noted.  Hip joints and knee joints in good range of motion without any warmth swelling or effusion.  There was no  tenderness over ankles or MTPs.  No dactylitis was noted.  No Achilles tendinitis or plantar fasciitis was noted.  CDAI Exam: CDAI Score: -- Patient Global: --; Provider Global: -- Swollen: --; Tender: -- Joint Exam 08/31/2023   No joint exam has been documented for this visit   There is currently no information documented on the homunculus. Go to the Rheumatology activity and complete the homunculus joint exam.  Investigation: No additional findings.  Imaging: XR Ankle 2 Views Left  Result Date: 08/31/2023 No tibiotalar or subtalar joint space narrowing was noted.  Possible erosion was noted to over the medial malleolus. Impression: These findings are suggestive of possible inflammatory arthritis.  XR Ankle 2 Views Right  Result Date: 08/31/2023 No tibiotalar or subtalar joint space narrowing was noted.  No erosive changes were noted. Impression: Unremarkable x-rays of the ankle.  XR Foot 2 Views Left  Result Date: 08/31/2023 First MTP, PIP and DIP narrowing was noted.  No intertarsal, tibiotalar or subtalar joint space narrowing was noted.  Inferior and posterior calcaneal spurs were noted.  Possible erosion was noted over the fifth metatarsal head. Impression: These findings are suggestive of osteoarthritis of the foot and erosive inflammatory arthritis  XR Foot 2 Views Right  Result Date: 08/31/2023 First MTP, PIP and DIP narrowing was noted.  No intertarsal, tibiotalar or subtalar joint space narrowing was noted.  Inferior and posterior calcaneal spurs were noted.  No erosive changes were noted. Impression: These findings are suggestive of osteoarthritis of the foot.  XR Hand 2 View Left  Result Date: 08/31/2023 CMC, PIP and DIP narrowing was noted.  Severe narrowing of the fourth DIP joint and subluxation was noted.  No MCP, intercarpal or radiocarpal joint space narrowing was noted.  Cystic changes were noted in the carpal bones. Impression: These findings are suggestive  of osteoarthritis of the hand.  XR Hand 2 View Right  Result Date: 08/31/2023 CMC, PIP and DIP narrowing was noted.  No MCP, intercarpal or radiocarpal joint space narrowing was noted.  No erosive changes were noted. Impression: These findings are suggestive of osteoarthritis of the hand.   Recent Labs: Lab Results  Component Value Date   WBC 6.4 09/15/2021   HGB  14.4 09/15/2021   PLT 243 09/15/2021   NA 138 01/22/2023   K 4.9 01/22/2023   CL 104 01/22/2023   CO2 30 01/22/2023   GLUCOSE 139 (H) 01/22/2023   BUN 11 01/22/2023   CREATININE 0.93 01/22/2023   BILITOT 0.6 09/15/2021   ALKPHOS 66 09/15/2021   AST 15 09/15/2021   ALT 16 09/15/2021   PROT 6.9 09/15/2021   ALBUMIN 4.4 09/15/2021   CALCIUM 9.6 01/22/2023   GFRAA >60 05/30/2016   January 25, 2023 hemoglobin A1c 6.8, LDL 49, CMP creatinine 0.99, AST 15, ALT 23, TSH 5.05, CBC WBC 6.2, hemoglobin 14.2, platelets 208  Mar 08, 2016 anti-CCP negative, RF negative, ESR 26, uric acid 4.8  CRP 19.6, hepatitis C negative, hepatitis B negative, hepatitis C negative,  X-rays of bilateral hands and bilateral feet were unremarkable at baseline. Speciality Comments: No specialty comments available.  Procedures:  No procedures performed Allergies: Oxaliplatin and Lisinopril   Assessment / Plan:     Visit Diagnoses: Psoriatic arthropathy (HCC) - Previous pt. [place] rhe[facility].  Diagnosed on the basis of inflammatory arthritis, dactylitis and psoriasis per chart review.  Patient was started on [place] in 2017.  He had good response to [place] which she continued until now.  He stopped [place] a week ago as he has been suffering from chronic diarrhea.  He does not want to take Otezla anymore.  He wanted to discuss other treatment options.  Different treatment options and their side effects were discussed.  He does not take frequent injections.  After reviewing indications side effects contraindications we decided to proceed with Tremfya.  A handout  was given and consent was taken.  We will apply for Tremfya.  Counseled patient that Tremfya is a IL-23 inhibitor.  Counseled patient on purpose, proper use, and adverse effects of Tremfya.  Reviewed the most common adverse effects including infection, URTIs, injection site reactions, nausea/diarrhea, and recurrence of tinea and HSV infections Counseled patient that Tremfya should be held for infection and prior to scheduled surgery.  Recommend annual influenza, PCV 15 or PCV20 or Pneumovax 23, and Shingrix as indicated.  Reviewed the importance of regular labs while on Tremfya therapy.  Will monitor CBC and CMP 1 month after starting and then every 3 months routinely thereafter. Will monitor TB gold annually. Standing orders placed.  Provided patient with medication education material and answered all questions.  Patient voiced understanding.  Patient consented to [facility].  Will upload consent into the media tab.  Reviewed storage instructions of Tremfya.  Advised initial injection must be administered in office.  Patient verbalized understanding.  Will apply for Tremfya through patient's insurance and update when we receive a response.  Dose will be Tremfya 100 mg on weeks 0 and 4 then every 8 weeks thereafter.  Prescription pending lab results and insurance approval.   Psoriasis -he is on triamcinolone acetonide cream 0.1% which he uses on as needed basis.  He has noticed some recurrence of rash on his face since he stopped [place].  High risk medication use - [place] started May 2017-October 2024 patient discontinued due to chronic diarrhea.  To start him on immunosuppressive therapy I will obtain following labs today.- Plan: CBC with Differential/Platelet, COMPLETE METABOLIC PANEL WITH GFR, Sedimentation rate, Hepatitis B core antibody, IgM, Hepatitis B surface antigen, Hepatitis C antibody, QuantiFERON-TB Gold Plus, Serum protein electrophoresis with reflex, IgG, IgA, IgM  Contracture of both  elbows-no synovitis was noted.  Pain in both hands -he complains of  intermittent discomfort in his hands.  No synovitis or dactylitis was noted today.  Left fifth finger DIP joint contracture was noted without synovitis.  Plan: XR Hand 2 View Right, XR Hand 2 View Left  Chronic pain of both ankles -patient states he had a lot of discomfort and swelling in his ankles in the beginning which resolved on [place].  Plan: ANA, XR Ankle 2 Views Right, XR Ankle 2 Views Left  Pain in both feet -is intermittent discomfort in feet.  No dactylitis or synovitis was noted.  Plan: XR Foot 2 Views Right, XR Foot 2 Views Left  History of diabetes mellitus-he is on metformin.  Dyslipidemia-he is on Zocor.  Patient states he had intentional weight loss of 50 pounds in the last 6 months.  Adenocarcinoma of rectum [facility]) - Status post rectum removal October 20, 2021.  Treated with chemotherapy and radiation therapy.  In remission residual left lower extremity numbness  Neuropathy-sick and [facility] to chemotherapy.  Fatty liver  Umbilical hernia without obstruction and without gangrene  History of esophageal stricture-patient is to see could not get further workup for esophageal stricture.  Orders: Orders Placed This Encounter  Procedures   XR Foot 2 Views Right   XR Foot 2 Views Left   XR Hand 2 View Right   XR Hand 2 View Left   XR Ankle 2 Views Right   XR Ankle 2 Views Left   CBC with Differential/Platelet   COMPLETE METABOLIC PANEL WITH GFR   Sedimentation rate   ANA   Hepatitis B core antibody, IgM   Hepatitis B surface antigen   Hepatitis C antibody   QuantiFERON-TB Gold Plus   Serum protein electrophoresis with reflex   IgG, IgA, IgM   No orders of the defined types were placed in this encounter.    Follow-Up Instructions: Return for Psoriatic arthritis.   Pollyann Savoy, MD  Note - This record has been created using [employer].  Chart creation errors have been sought, but may  not always  have been located. Such creation errors do not reflect on  the standard of medical care.

## 2023-08-31 ENCOUNTER — Ambulatory Visit: Payer: Medicaid Other

## 2023-08-31 ENCOUNTER — Ambulatory Visit (INDEPENDENT_AMBULATORY_CARE_PROVIDER_SITE_OTHER): Payer: Medicaid Other

## 2023-08-31 ENCOUNTER — Telehealth: Payer: Self-pay | Admitting: Pharmacist

## 2023-08-31 ENCOUNTER — Ambulatory Visit: Payer: Medicaid Other | Attending: Rheumatology | Admitting: Rheumatology

## 2023-08-31 ENCOUNTER — Encounter: Payer: Self-pay | Admitting: Rheumatology

## 2023-08-31 VITALS — BP 117/77 | HR 77 | Resp 15 | Ht 67.0 in | Wt 188.4 lb

## 2023-08-31 DIAGNOSIS — E038 Other specified hypothyroidism: Secondary | ICD-10-CM

## 2023-08-31 DIAGNOSIS — M79671 Pain in right foot: Secondary | ICD-10-CM

## 2023-08-31 DIAGNOSIS — M24521 Contracture, right elbow: Secondary | ICD-10-CM

## 2023-08-31 DIAGNOSIS — G629 Polyneuropathy, unspecified: Secondary | ICD-10-CM

## 2023-08-31 DIAGNOSIS — Z79899 Other long term (current) drug therapy: Secondary | ICD-10-CM

## 2023-08-31 DIAGNOSIS — L4050 Arthropathic psoriasis, unspecified: Secondary | ICD-10-CM

## 2023-08-31 DIAGNOSIS — M25571 Pain in right ankle and joints of right foot: Secondary | ICD-10-CM

## 2023-08-31 DIAGNOSIS — G8929 Other chronic pain: Secondary | ICD-10-CM | POA: Diagnosis not present

## 2023-08-31 DIAGNOSIS — M79642 Pain in left hand: Secondary | ICD-10-CM

## 2023-08-31 DIAGNOSIS — M24522 Contracture, left elbow: Secondary | ICD-10-CM

## 2023-08-31 DIAGNOSIS — C20 Malignant neoplasm of rectum: Secondary | ICD-10-CM

## 2023-08-31 DIAGNOSIS — Z8639 Personal history of other endocrine, nutritional and metabolic disease: Secondary | ICD-10-CM

## 2023-08-31 DIAGNOSIS — M25572 Pain in left ankle and joints of left foot: Secondary | ICD-10-CM

## 2023-08-31 DIAGNOSIS — M79672 Pain in left foot: Secondary | ICD-10-CM

## 2023-08-31 DIAGNOSIS — K760 Fatty (change of) liver, not elsewhere classified: Secondary | ICD-10-CM

## 2023-08-31 DIAGNOSIS — E785 Hyperlipidemia, unspecified: Secondary | ICD-10-CM

## 2023-08-31 DIAGNOSIS — Z8719 Personal history of other diseases of the digestive system: Secondary | ICD-10-CM

## 2023-08-31 DIAGNOSIS — K429 Umbilical hernia without obstruction or gangrene: Secondary | ICD-10-CM

## 2023-08-31 DIAGNOSIS — Z5111 Encounter for antineoplastic chemotherapy: Secondary | ICD-10-CM

## 2023-08-31 DIAGNOSIS — F40298 Other specified phobia: Secondary | ICD-10-CM

## 2023-08-31 DIAGNOSIS — L409 Psoriasis, unspecified: Secondary | ICD-10-CM

## 2023-08-31 DIAGNOSIS — M79641 Pain in right hand: Secondary | ICD-10-CM

## 2023-08-31 NOTE — Patient Instructions (Addendum)
Guselkumab Injection What is this medication? GUSELKUMAB (goo ZELK ue mab) treats autoimmune conditions, such as arthritis and psoriasis. It works by slowing down an overactive immune system. It is a monoclonal antibody. This medicine may be used for other purposes; ask your health care provider or pharmacist if you have questions. COMMON BRAND NAME(S): Tremfya What should I tell my care team before I take this medication? They need to know if you have any of these conditions: Immune system problems Infection, such as a virus infection, chickenpox, cold sores, herpes, or a history of infections Recently received or scheduled to receive a vaccine Tuberculosis, a positive skin test for tuberculosis, or recent close contact with someone who has tuberculosis An unusual or allergic reaction to guselkumab, other medications, foods, dyes, or preservatives Pregnant or trying to get pregnant Breast-feeding How should I use this medication? This medication is injected under the skin. It is usually given by a care team in a hospital or clinic setting. It may also be given at home. If you get this medication at home, you will be taught how to prepare and give it. Use exactly as directed. Take it as directed on the prescription label. Keep taking it unless your care team tells you to stop. It is important that you put your used needles and syringes in a special sharps container. Do not put them in a trash can. If you do not have a sharps container, call your pharmacist or care team to get one. This medication comes with INSTRUCTIONS FOR USE. Ask your pharmacist for directions on how to use this medication. Read the information carefully. Talk to your pharmacist or care team if you have questions. A special MedGuide will be given to you by the pharmacist with each prescription and refill. Be sure to read this information carefully each time. Talk to your care team about the use of this medication in children.  Special care may be needed. Overdosage: If you think you have taken too much of this medicine contact a poison control center or emergency room at once. NOTE: This medicine is only for you. Do not share this medicine with others. What if I miss a dose? If you get this medication at a hospital or clinic: It is important not to miss your dose. Call your care team if you are unable to keep an appointment. If you give yourself this medication at home: If you miss a dose, take it as soon as you can. Then continue your normal schedule. If it is almost time for your next dose, take only that dose. Do not take double or extra doses. Call your care team with questions. What may interact with this medication? Do not take this medication with any of the following: Live virus vaccines This medication may also interact with the following: Amoxapine Certain medications for depression, anxiety, or mental health conditions, such as amitriptyline, clomipramine, desipramine, doxepin, imipramine, maprotiline, nortriptyline, protriptyline, trimipramine Codeine Inactivated vaccines Methadone Pimozide Thioridazine This list may not describe all possible interactions. Give your health care provider a list of all the medicines, herbs, non-prescription drugs, or dietary supplements you use. Also tell them if you smoke, drink alcohol, or use illegal drugs. Some items may interact with your medicine. What should I watch for while using this medication? Your condition will be monitored carefully while you are receiving this medication. Tell your care team if your symptoms do not start to get better or if they get worse. You will be tested for  tuberculosis (TB) before you start this medication. If your care team prescribes any medication for TB, you should start taking the TB medication before starting this medication. Make sure to finish the full course of TB medication. This medication may increase your risk of getting an  infection. Call your care team for advice if you get a fever, chills, sore throat, or other symptoms of a cold or flu. Do not treat yourself. Try to avoid being around people who are sick. What side effects may I notice from receiving this medication? Side effects that you should report to your care team as soon as possible: Allergic reactions--skin rash, itching, hives, swelling of the face, lips, tongue, or throat Infection--fever, chills, cough, sore throat, wounds that don't heal, pain or trouble when passing urine, general feeling of discomfort or being unwell Side effects that usually do not require medical attention (report to your care team if they continue or are bothersome): Diarrhea Headache Joint pain Pain, redness, or irritation at injection site Runny or stuffy nose Sore throat This list may not describe all possible side effects. Call your doctor for medical advice about side effects. You may report side effects to FDA at 1-800-FDA-1088. Where should I keep my medication? Keep out of the reach of children and pets. Store in the refrigerator. Do not freeze. Keep it in the original container until you are ready to use. Protect from light. Do not shake. Remove the dose from the refrigerator about 30 minutes prior to use. Get rid of any unused medication after the expiration date on the label. To get rid of medications that are no longer needed or have expired: Take the medication to a medication take-back program. Check with your pharmacy or law enforcement to find a location. If you cannot return the medication, ask your pharmacist or care team how to get rid of this medication safely. NOTE: This sheet is a summary. It may not cover all possible information. If you have questions about this medicine, talk to your doctor, pharmacist, or health care provider.  2024 Elsevier/Gold Standard (2022-05-11 00:00:00)  Standing Labs We placed an order today for your standing lab work.    Please have your standing labs drawn in 1 month after starting medication and then every 3 months  Please have your labs drawn 2 weeks prior to your appointment so that the provider can discuss your lab results at your appointment, if possible.  Please note that you may see your imaging and lab results in MyChart before we have reviewed them. We will contact you once all results are reviewed. Please allow our office up to 72 hours to thoroughly review all of the results before contacting the office for clarification of your results.  WALK-IN LAB HOURS  Monday through Thursday from 8:00 am -12:30 pm and 1:00 pm-5:00 pm and Friday from 8:00 am-12:00 pm.  Patients with office visits requiring labs will be seen before walk-in labs.  You may encounter longer than normal wait times. Please allow additional time. Wait times may be shorter on  Monday and Thursday afternoons.  We do not book appointments for walk-in labs. We appreciate your patience and understanding with our staff.   Labs are drawn by [facility]. Please bring your co-pay at the time of your lab draw.  You may receive a bill from [facility] for your lab work.  Please note if you are on Hydroxychloroquine and and an order has been placed for a Hydroxychloroquine level,  you will need  to have it drawn 4 hours or more after your last dose.  If you wish to have your labs drawn at another location, please call the office 24 hours in advance so we can fax the orders.  The office is located at [address], [address], [place], [place] [address]   If you have any questions regarding directions or hours of operation,  please call [phone].   As a reminder, please drink plenty of water prior to coming for your lab work. Thanks!  Vaccines You are taking a medication(s) that can suppress your immune system.  The following immunizations are recommended: Flu annually Covid-19  RSV Td/Tdap (tetanus, diphtheria, pertussis) every 10  years Pneumonia (Prevnar 15 then Pneumovax 23 at least 1 year apart.  Alternatively, can take Prevnar 20 without needing additional dose) Shingrix: 2 doses from 4 weeks to 6 months apart  Please check with your PCP to make sure you are up to date.  If you have signs or symptoms of an infection or start antibiotics: First, call your PCP for workup of your infection. Hold your medication through the infection, until you complete your antibiotics, and until symptoms resolve if you take the following: Injectable medication (Actemra, Benlysta, Cimzia, Cosentyx, Enbrel, Humira, Kevzara, Orencia, Remicade, Simponi, Stelara, Taltz, Tremfya) Methotrexate Leflunomide (Arava) Mycophenolate (Cellcept) Harriette Ohara, Olumiant, or Rinvoq

## 2023-08-31 NOTE — Telephone Encounter (Addendum)
Pending baseline labs from today (TB gold and hepatitis), patient will be new start to [facility]. SAVED a Prior Authorization request to [facility] for [facility] via CoverMyMeds. Pending labs  Key: [id number]   ----- Message from CMA Marissa G sent at 08/31/2023 10:15 AM EDT ----- Please apply for tremfya, per Dr. Corliss Skains. Thanks!   Consent obtained and sent to the scan center.

## 2023-09-03 LAB — CBC WITH DIFFERENTIAL/PLATELET
Absolute Lymphocytes: 1186 {cells}/uL (ref 850–3900)
Absolute Monocytes: 456 {cells}/uL (ref 200–950)
Basophils Absolute: 40 {cells}/uL (ref 0–200)
Basophils Relative: 0.7 %
Eosinophils Absolute: 51 {cells}/uL (ref 15–500)
Eosinophils Relative: 0.9 %
HCT: 44.4 % (ref 38.5–50.0)
Hemoglobin: 14.4 g/dL (ref 13.2–17.1)
MCH: 27.7 pg (ref 27.0–33.0)
MCHC: 32.4 g/dL (ref 32.0–36.0)
MCV: 85.4 fL (ref 80.0–100.0)
MPV: 9.3 fL (ref 7.5–12.5)
Monocytes Relative: 8 %
Neutro Abs: 3967 {cells}/uL (ref 1500–7800)
Neutrophils Relative %: 69.6 %
Platelets: 230 10*3/uL (ref 140–400)
RBC: 5.2 10*6/uL (ref 4.20–5.80)
RDW: 13.1 % (ref 11.0–15.0)
Total Lymphocyte: 20.8 %
WBC: 5.7 10*3/uL (ref 3.8–10.8)

## 2023-09-03 LAB — QUANTIFERON-TB GOLD PLUS
Mitogen-NIL: 7.35 [IU]/mL
NIL: 0.05 [IU]/mL
QuantiFERON-TB Gold Plus: NEGATIVE
TB1-NIL: <0 [IU]/mL
TB2-NIL: <0 [IU]/mL

## 2023-09-03 LAB — COMPLETE METABOLIC PANEL WITH GFR
AG Ratio: 1.6 (calc) (ref 1.0–2.5)
ALT: 34 U/L (ref 9–46)
AST: 20 U/L (ref 10–35)
Albumin: 4.2 g/dL (ref 3.6–5.1)
Alkaline phosphatase (APISO): 111 U/L (ref 35–144)
BUN: 11 mg/dL (ref 7–25)
CO2: 27 mmol/L (ref 20–32)
Calcium: 9.4 mg/dL (ref 8.6–10.3)
Chloride: 104 mmol/L (ref 98–110)
Creat: 0.91 mg/dL (ref 0.70–1.30)
Globulin: 2.6 g/dL (ref 1.9–3.7)
Glucose, Bld: 85 mg/dL (ref 65–99)
Potassium: 4.2 mmol/L (ref 3.5–5.3)
Sodium: 141 mmol/L (ref 135–146)
Total Bilirubin: 0.5 mg/dL (ref 0.2–1.2)
Total Protein: 6.8 g/dL (ref 6.1–8.1)
eGFR: 101 mL/min/{1.73_m2} (ref >=60)

## 2023-09-03 LAB — IGG, IGA, IGM
IgG (Immunoglobin G), Serum: 835 mg/dL (ref 600–1640)
IgM, Serum: 74 mg/dL (ref 50–300)
Immunoglobulin A: 288 mg/dL (ref 47–310)

## 2023-09-03 LAB — PROTEIN ELECTROPHORESIS, SERUM, WITH REFLEX
Albumin ELP: 4 g/dL (ref 3.8–4.8)
Alpha 1: 0.3 g/dL (ref 0.2–0.3)
Alpha 2: 0.7 g/dL (ref 0.5–0.9)
Beta 2: 0.4 g/dL (ref 0.2–0.5)
Beta Globulin: 0.6 g/dL (ref 0.4–0.6)
Gamma Globulin: 0.8 g/dL (ref 0.8–1.7)
Total Protein: 6.8 g/dL (ref 6.1–8.1)

## 2023-09-03 LAB — ANA: Anti Nuclear Antibody (ANA): NEGATIVE

## 2023-09-03 LAB — SEDIMENTATION RATE: Sed Rate: 6 mm/h (ref 0–20)

## 2023-09-03 LAB — HEPATITIS B CORE ANTIBODY, IGM: Hep B C IgM: NONREACTIVE

## 2023-09-03 LAB — HEPATITIS C ANTIBODY: Hepatitis C Ab: NONREACTIVE

## 2023-09-03 LAB — HEPATITIS B SURFACE ANTIGEN: Hepatitis B Surface Ag: NONREACTIVE

## 2023-09-03 NOTE — Progress Notes (Signed)
CBC, CMP are normal.  Sed rate normal, ANA negative, hepatitis B-, hepatitis C negative, immunoglobulins normal, TB Gold negative, SPEP pending.  Okay to proceed with Tremfya as discussed during the last visit.

## 2023-09-03 NOTE — Telephone Encounter (Signed)
PA for Tremfya submitted with TB gold and hepatitis labs

## 2023-09-03 NOTE — Progress Notes (Signed)
SPEP is normal.

## 2023-09-04 ENCOUNTER — Other Ambulatory Visit (HOSPITAL_COMMUNITY): Payer: Self-pay

## 2023-09-04 ENCOUNTER — Other Ambulatory Visit: Payer: Self-pay

## 2023-09-04 MED ORDER — TREMFYA 100 MG/ML ~~LOC~~ SOAJ
SUBCUTANEOUS | 0 refills | Status: DC
  Filled 2023-09-04: qty 1, 28d supply, fill #0

## 2023-09-04 NOTE — Progress Notes (Signed)
Specialty Pharmacy Initial Fill Coordination Note  Eric Lewis is a 52 y.o. male contacted today regarding refills of specialty medication(s) Guselkumab   Patient requested Courier to Provider Office   Delivery date: 09/06/23   Verified address: [address] [place], [place] [address]   Medication will be filled on 09/05/2023.   Patient is aware of $4 copayment.

## 2023-09-04 NOTE — Telephone Encounter (Signed)
Received notification from [facility] regarding a prior authorization for [facility]. Authorization has been APPROVED from 09/03/23 to 09/02/2024. Approval letter sent to scan center.  Copay for 28 days supply is $4  Patient can fill through [facility]: [phone]   Authorization # [id number]  Patient scheduled for Tremfya new start on 09/10/2023. Rx sent to [facility].  Chesley Mires, PharmD, MPH, BCPS, CPP Clinical Pharmacist (Rheumatology and Pulmonology)

## 2023-09-06 ENCOUNTER — Other Ambulatory Visit: Payer: Self-pay

## 2023-09-06 ENCOUNTER — Other Ambulatory Visit (HOSPITAL_COMMUNITY): Payer: Self-pay

## 2023-09-10 ENCOUNTER — Other Ambulatory Visit: Payer: Self-pay

## 2023-09-10 ENCOUNTER — Other Ambulatory Visit: Payer: Self-pay | Admitting: Pharmacist

## 2023-09-10 ENCOUNTER — Ambulatory Visit: Payer: Medicaid Other | Attending: Rheumatology | Admitting: Pharmacist

## 2023-09-10 DIAGNOSIS — Z79899 Other long term (current) drug therapy: Secondary | ICD-10-CM

## 2023-09-10 DIAGNOSIS — Z7189 Other specified counseling: Secondary | ICD-10-CM

## 2023-09-10 DIAGNOSIS — L409 Psoriasis, unspecified: Secondary | ICD-10-CM

## 2023-09-10 DIAGNOSIS — L4050 Arthropathic psoriasis, unspecified: Secondary | ICD-10-CM

## 2023-09-10 MED ORDER — TREMFYA 100 MG/ML ~~LOC~~ SOAJ
SUBCUTANEOUS | 1 refills | Status: DC
  Filled 2023-09-10: qty 1, fill #0
  Filled 2023-09-27 (×2): qty 1, 56d supply, fill #0

## 2023-09-10 NOTE — Patient Instructions (Signed)
Your next TREMFYA dose is due on 10/08/2023, then every 8 weeks thereafter (starting 12/03/23)  HOLD TREMFYA if you have signs or symptoms of an infection. You can resume once you feel better or back to your baseline. HOLD TREMFYA if you start antibiotics to treat an infection. HOLD TREMFYA around the time of surgery/procedures. Your surgeon will be able to provide recommendations on when to hold BEFORE and when you are cleared to RESUME.  Pharmacy information: Your prescription will be shipped from [facility]. Their phone number is [phone] They will call to schedule shipment and confirm address. They will mail your medication to your home.  Labs are due in 1 month then every 3 months. Lab hours are from Monday to Thursday 8am-12:30pm and 1pm-5pm and Friday 8am-12pm. You do not need an appointment if you come for labs during these times. If you'd like to go to a [facility] or [facility] closer to home, please call our clinic 48 hours prior to lab date so we can release orders in a timely manner.  How to manage an injection site reaction: Remember the 5 C's: COUNTER - leave on the counter at least 30 minutes but up to overnight to bring medication to room temperature. This may help prevent stinging COLD - place something cold (like an ice gel pack or cold water bottle) on the injection site just before cleansing with alcohol. This may help reduce pain CLARITIN - use Claritin (generic name is loratadine) for the first two weeks of treatment or the day of, the day before, and the day after injecting. This will help to minimize injection site reactions CORTISONE CREAM - apply if injection site is irritated and itching CALL ME - if injection site reaction is bigger than the size of your fist, looks infected, blisters, or if you develop hives

## 2023-09-10 NOTE — Progress Notes (Signed)
Patient started on Tremfya in office on 09/10/2023. Tolerated without issue. Switching from [place] d/t recurrent diarrhea. Please refer to clinical support OV note from 09/10/2023  Chesley Mires, PharmD, MPH, BCPS, CPP Clinical Pharmacist (Rheumatology and Pulmonology)

## 2023-09-10 NOTE — Progress Notes (Signed)
Pharmacy Note  Subjective:   Patient presents to clinic today to receive first dose of Tremfya for psoriatic arthritis and psoriasis. He is naive to injectable DMARDs. Switching from [place] due to recurrent diarrhea.  He did previously self-administer enoxaparin PFS injections in abdomen  Patient running a fever or have signs/symptoms of infection? No  Patient currently on antibiotics for the treatment of infection? No  Patient have any upcoming invasive procedures/surgeries? No  Objective: CMP     Component Value Date/Time   NA 141 08/31/2023 0942   K 4.2 08/31/2023 0942   CL 104 08/31/2023 0942   CO2 27 08/31/2023 0942   GLUCOSE 85 08/31/2023 0942   BUN 11 08/31/2023 0942   CREATININE 0.91 08/31/2023 0942   CALCIUM 9.4 08/31/2023 0942   PROT 6.8 08/31/2023 0942   PROT 6.8 08/31/2023 0942   ALBUMIN 4.4 09/15/2021 1427   AST 20 08/31/2023 0942   AST 15 09/15/2021 1427   ALT 34 08/31/2023 0942   ALT 16 09/15/2021 1427   ALKPHOS 66 09/15/2021 1427   BILITOT 0.5 08/31/2023 0942   BILITOT 0.6 09/15/2021 1427   GFRNONAA >60 01/22/2023 1059   GFRAA >60 05/30/2016 1023    CBC    Component Value Date/Time   WBC 5.7 08/31/2023 0942   RBC 5.20 08/31/2023 0942   HGB 14.4 08/31/2023 0942   HGB 14.4 09/15/2021 1427   HCT 44.4 08/31/2023 0942   PLT 230 08/31/2023 0942   PLT 243 09/15/2021 1427   MCV 85.4 08/31/2023 0942   MCH 27.7 08/31/2023 0942   MCHC 32.4 08/31/2023 0942   RDW 13.1 08/31/2023 0942   LYMPHSABS 1.1 09/15/2021 1427   MONOABS 0.7 09/15/2021 1427   EOSABS 51 08/31/2023 0942   BASOSABS 40 08/31/2023 0942    Baseline Immunosuppressant Therapy Labs TB GOLD    Latest Ref Rng & Units 08/31/2023    9:42 AM  Quantiferon TB Gold  Quantiferon TB Gold Plus NEGATIVE NEGATIVE    Hepatitis Panel    Latest Ref Rng & Units 08/31/2023    9:42 AM  Hepatitis  Hep B Surface Ag NON-REACTIVE NON-REACTIVE   Hep B IgM NON-REACTIVE NON-REACTIVE   Hep C Ab  NON-REACTIVE NON-REACTIVE    HIV No results found for: "HIV" Immunoglobulins    Latest Ref Rng & Units 08/31/2023    9:42 AM  Immunoglobulin Electrophoresis  IgA  47 - 310 mg/dL 742   IgG 595 - 6,387 mg/dL 564   IgM 50 - 332 mg/dL 74    SPEP    Latest Ref Rng & Units 08/31/2023    9:42 AM  Serum Protein Electrophoresis  Total Protein 6.1 - 8.1 g/dL 6.1 - 8.1 g/dL 6.8    6.8   Albumin 3.8 - 4.8 g/dL 4.0   Alpha-1 0.2 - 0.3 g/dL 0.3   Alpha-2 0.5 - 0.9 g/dL 0.7   Beta Globulin 0.4 - 0.6 g/dL 0.6   Beta 2 0.2 - 0.5 g/dL 0.4   Gamma Globulin 0.8 - 1.7 g/dL 0.8   Interpretation  --    Chest x-ray: 01/22/23 - Stable postsurgical changes in presacral space. No evidence of recurrent or metastatic carcinoma.  Assessment/Plan:  Reviewed importance of holding TREMFYA with signs/symptoms of an infections, if antibiotics are prescribed to treat an active infection, and with invasive procedures  Demonstrated proper injection technique with [facility] demo device  Patient able to demonstrate proper injection technique using the teach back method.  Patient self injected in  the left upper thigh with:  Sample Medication: Tremfya 100mg /mL autoinjector pen NDC: [phone] Lot: [id number] Expiration: 07/2024  Patient tolerated well.  Observed for 30 mins in office for adverse reaction. Patient denies itchiness and irritation at injection., Reviewed injection site reaction management with patient verbally and printed information for review in AVS, and no swelling noted  Some redness but patient denies itchiness or irritation  Patient is to return in 1 month for labs and 6-8 weeks for follow-up appointment.  Standing orders for CBC/CMP placed.  TB gold will be monitored yearly.  TREMFYA approved through insurance .   Rx sent to: [facility]: [phone] .  Patient provided with pharmacy phone number and advised that they will call to schedule shipment to home.  Patient will  continue Tremfya 100mg  subcut at Week 0 (self-administered in clinic today), Week 4, then every 8 weeks thereafter  All questions encouraged and answered.  Instructed patient to call with any further questions or concerns.  Chesley Mires, PharmD, MPH, BCPS, CPP Clinical Pharmacist (Rheumatology and Pulmonology)  09/10/2023 8:39 AM

## 2023-09-27 ENCOUNTER — Other Ambulatory Visit: Payer: Self-pay

## 2023-09-27 ENCOUNTER — Other Ambulatory Visit: Payer: Self-pay | Admitting: Pharmacist

## 2023-09-27 ENCOUNTER — Ambulatory Visit: Payer: Medicaid Other | Admitting: Rheumatology

## 2023-09-27 DIAGNOSIS — L409 Psoriasis, unspecified: Secondary | ICD-10-CM

## 2023-09-27 DIAGNOSIS — L4050 Arthropathic psoriasis, unspecified: Secondary | ICD-10-CM

## 2023-09-27 DIAGNOSIS — Z79899 Other long term (current) drug therapy: Secondary | ICD-10-CM

## 2023-09-27 MED ORDER — TREMFYA 100 MG/ML ~~LOC~~ SOAJ
SUBCUTANEOUS | 1 refills | Status: DC
  Filled 2023-09-27: qty 1, 56d supply, fill #0
  Filled 2023-11-23: qty 1, 56d supply, fill #1

## 2023-09-27 NOTE — Progress Notes (Signed)
Specialty Pharmacy Refill Coordination Note  Eric Lewis is a 52 y.o. male contacted today regarding refills of specialty medication(s) Guselkumab   Patient requested Delivery   Delivery date: 10/03/23   Verified address: [address] [place] [address]   Medication will be filled on 10/02/23.

## 2023-09-27 NOTE — Telephone Encounter (Signed)
Telephone call  

## 2023-10-02 ENCOUNTER — Other Ambulatory Visit: Payer: Self-pay

## 2023-10-08 ENCOUNTER — Telehealth: Payer: Self-pay | Admitting: Rheumatology

## 2023-10-08 ENCOUNTER — Other Ambulatory Visit: Payer: Self-pay | Admitting: *Deleted

## 2023-10-08 DIAGNOSIS — L4050 Arthropathic psoriasis, unspecified: Secondary | ICD-10-CM

## 2023-10-08 DIAGNOSIS — L409 Psoriasis, unspecified: Secondary | ICD-10-CM

## 2023-10-08 DIAGNOSIS — Z79899 Other long term (current) drug therapy: Secondary | ICD-10-CM

## 2023-10-08 LAB — CBC WITH DIFFERENTIAL/PLATELET
Absolute Lymphocytes: 1165 {cells}/uL (ref 850–3900)
Absolute Monocytes: 435 {cells}/uL (ref 200–950)
Basophils Absolute: 60 {cells}/uL (ref 0–200)
Basophils Relative: 1.2 %
Eosinophils Absolute: 90 {cells}/uL (ref 15–500)
Eosinophils Relative: 1.8 %
HCT: 43.7 % (ref 38.5–50.0)
Hemoglobin: 14.4 g/dL (ref 13.2–17.1)
MCH: 28.1 pg (ref 27.0–33.0)
MCHC: 33 g/dL (ref 32.0–36.0)
MCV: 85.4 fL (ref 80.0–100.0)
MPV: 9.3 fL (ref 7.5–12.5)
Monocytes Relative: 8.7 %
Neutro Abs: 3250 {cells}/uL (ref 1500–7800)
Neutrophils Relative %: 65 %
Platelets: 214 10*3/uL (ref 140–400)
RBC: 5.12 Million/uL (ref 4.20–5.80)
RDW: 13 % (ref 11.0–15.0)
Total Lymphocyte: 23.3 %
WBC: 5 10*3/uL (ref 3.8–10.8)

## 2023-10-08 LAB — COMPLETE METABOLIC PANEL WITH GFR
AG Ratio: 1.6 (calc) (ref 1.0–2.5)
ALT: 24 U/L (ref 9–46)
AST: 19 U/L (ref 10–35)
Albumin: 4.1 g/dL (ref 3.6–5.1)
Alkaline phosphatase (APISO): 96 U/L (ref 35–144)
BUN: 12 mg/dL (ref 7–25)
CO2: 32 mmol/L (ref 20–32)
Calcium: 9.5 mg/dL (ref 8.6–10.3)
Chloride: 104 mmol/L (ref 98–110)
Creat: 0.88 mg/dL (ref 0.70–1.30)
Globulin: 2.5 g/dL (ref 1.9–3.7)
Glucose, Bld: 80 mg/dL (ref 65–99)
Potassium: 4.7 mmol/L (ref 3.5–5.3)
Sodium: 140 mmol/L (ref 135–146)
Total Bilirubin: 0.4 mg/dL (ref 0.2–1.2)
Total Protein: 6.6 g/dL (ref 6.1–8.1)
eGFR: 103 mL/min/{1.73_m2} (ref >=60)

## 2023-10-08 NOTE — Telephone Encounter (Signed)
Patient returned the call and will contact his insurance company.

## 2023-10-08 NOTE — Telephone Encounter (Signed)
Attempted to contact the patient and left message for patient to advise he would need to reach out to [facility] to see who may be in network for him to see.

## 2023-10-08 NOTE — Telephone Encounter (Signed)
Patient states he received a letter from [facility] stating as of 11/07/23 Dr. Corliss Skains will no longer be a participating provider.  Patient states he picked the office due to being in network and requested a return call.

## 2023-10-09 NOTE — Progress Notes (Signed)
CBC and CMP are normal.

## 2023-10-11 ENCOUNTER — Encounter: Payer: Medicaid Other | Admitting: Rheumatology

## 2023-10-23 NOTE — Progress Notes (Signed)
 Office Visit Note  Eric Lewis: Eric Lewis             Date of Birth: 1971-06-03           MRN: [id number]             PCP: Regino Slater, MD Referring: Regino Slater, MD Visit Date: 11/06/2023 Occupation: @GUAROCC @  Subjective:  Medication monitoring   History of Present Illness: Eric Lewis is a 52 y.o. male with history of psoriatic arthritis.  Eric Lewis was started on tremfya  on 09/10/23.  Eric Lewis has had a total of 2 doses of Tremfya  so far and has noticed up to 90% improvement in skin clearance on his scalp since initiating therapy.  Eric Lewis has been tolerating Tremfya  without any side effects or injection site reactions.  Eric Lewis has not had any recent or recurrent infections.  Eric Lewis experiences intermittent stiffness in the left ankle in both hands but denies any joint swelling.  Eric Lewis denies any Achilles tendinitis or plantar fasciitis.  Eric Lewis denies any SI joint pain.    Activities of Daily Living:  Eric Lewis reports morning stiffness for less then 1 minute.   Eric Lewis Denies nocturnal pain.  Difficulty dressing/grooming: Denies Difficulty climbing stairs: Denies Difficulty getting out of chair: Denies Difficulty using hands for taps, buttons, cutlery, and/or writing: Denies  Review of Systems  Constitutional:  Negative for fatigue.  HENT:  Negative for mouth sores and mouth dryness.   Eyes:  Negative for dryness.  Respiratory:  Negative for shortness of breath.   Cardiovascular:  Negative for chest pain and palpitations.  Gastrointestinal:  Positive for diarrhea. Negative for blood in stool and constipation.  Endocrine: Negative for increased urination.  Genitourinary:  Negative for involuntary urination.  Musculoskeletal:  Positive for morning stiffness. Negative for joint pain, gait problem, joint pain, joint swelling, myalgias, muscle weakness, muscle tenderness and myalgias.  Skin:  Positive for rash. Negative for color change, hair loss and sensitivity to sunlight.   Allergic/Immunologic: Negative for susceptible to infections.  Neurological:  Negative for dizziness and headaches.  Hematological:  Negative for swollen glands.  Psychiatric/Behavioral:  Negative for depressed mood and sleep disturbance. The Eric Lewis is not nervous/anxious.     PMFS History:  Eric Lewis Active Problem List   Diagnosis Date Noted   Encounter for antineoplastic chemotherapy 05/03/2021   Genetic testing 03/20/2021   Family history of breast cancer    Family history of prostate cancer    Adenocarcinoma of rectum (HCC) 02/24/2021   Septic arthritis of knee, right (HCC) 09/30/2014   Diabetes mellitus (HCC) 09/30/2014   Dyslipidemia 09/30/2014   Obesity 09/30/2014    Past Medical History:  Diagnosis Date   COVID jan 30 th 2022   fever x 4 days all sx resolved   DM type 2 (diabetes mellitus, type 2) (HCC)    Family history of breast cancer    Family history of prostate cancer    History of hypertension    no meds x 1 year due to weight loss as of 03-16-2021   Hypercholesteremia    Obesity    Psoriatic arthritis (HCC)    Rectal cancer (HCC)    Umbilical hernia     Family History  Problem Relation Age of Onset   Emphysema Father    Prostate cancer Paternal Uncle 46   Emphysema Maternal Grandmother    Breast cancer Paternal Grandmother        dx 36s   Past Surgical History:  Procedure Laterality Date  CHONDROPLASTY Right 09/28/2014   Procedure: CHONDROPLASTY, PATELLA/FEMORAL;  Surgeon: Norleen LITTIE Gavel, MD;  Location: [facility];  Service: Orthopedics;  Laterality: Right;   colonscopy  03-17-2021   [facility]   ILEOSTOMY CLOSURE  03/27/2023   IR CV LINE INJECTION  09/23/2021   IR REMOVAL TUN ACCESS W/ PORT W/O FL MOD SED  09/30/2021   KNEE ARTHROSCOPY WITH MEDIAL MENISECTOMY Right 09/28/2014   Procedure: KNEE ARTHROSCOPY WITH MEDIAL MENISECTOMY;  Surgeon: Norleen LITTIE Gavel, MD;  Location: [facility];  Service: Orthopedics;  Laterality: Right;    PORTACATH PLACEMENT Right 03/18/2021   Procedure: PLACEMENT PORT-A-CATH;  Surgeon: Teresa Lonni HERO, MD;  Location: [facility];  Service: General;  Laterality: Right;   ROBOTIC COLECTOMY, PARTIAL, WITH ANASTOMOSIS, WITH COLOPROCTOSTOMY (LOW PELVIC ANASTOMOSIS) WITH IIEOSTOMY  10/20/2021   [facility]   SYNOVECTOMY Right 09/28/2014   Procedure: SYNOVECTOMY, THREE COMPARTMENT;  Surgeon: Norleen LITTIE Gavel, MD;  Location: [facility];  Service: Orthopedics;  Laterality: Right;   Social History   Social History Narrative   Reports Eric Lewis is the middle child of two other siblings. 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      Right Handed    Lives in a two story home    Immunization History  Administered Date(s) Administered   Influenza Split 07/27/2014   PFIZER(Purple Top)SARS-COV-2 Vaccination 02/05/2020, 03/01/2020   Pneumococcal Polysaccharide-23 12/04/2014   Tdap 09/09/2012, 05/08/2022     Objective: Vital Signs: BP 130/83 (BP Location: Left Arm, Eric Lewis Position: Sitting, Cuff Size: Normal)   Pulse 74   Resp 14   Ht 5' 7 (1.702 m)   Wt 196 lb (88.9 kg)   BMI 30.70 kg/m    Physical Exam Vitals and nursing note reviewed.  Constitutional:      Appearance: Eric Lewis is well-developed.  HENT:     Head: Normocephalic and atraumatic.  Eyes:     Conjunctiva/sclera: Conjunctivae normal.     Pupils: Pupils are equal, round, and reactive to light.  Cardiovascular:     Rate and Rhythm: Normal rate and regular rhythm.     Heart sounds: Normal heart sounds.  Pulmonary:     Effort: Pulmonary effort is normal.     Breath sounds: Normal breath sounds.  Abdominal:     General: Bowel sounds are normal.     Palpations: Abdomen is soft.  Musculoskeletal:     Cervical back: Normal range of motion and neck supple.  Skin:    General: Skin is warm and dry.     Capillary Refill: Capillary refill takes less than 2 seconds.  Neurological:     Mental Status: Eric Lewis is  alert and oriented to person, place, and time.  Psychiatric:        Behavior: Behavior normal.      Musculoskeletal Exam: C-spine has limited range of motion with lateral rotation.  No midline spinal tenderness.  No SI joint tenderness.  Shoulder joints have good range of motion with no discomfort.  Bilateral elbow joint flexion contractures noted.  Limited range of motion of both wrist joints especially with extension.  PIP and DIP thickening noted.  Flexion contracture of the left fourth DIP.  Hip joints have good range of motion with no groin pain.  Knee joints have good range of motion no warmth or effusion.  Ankle joints have good range of motion with no tenderness or joint swelling.  No evidence of Achilles tendinitis.  CDAI Exam:  CDAI Score: -- Eric Lewis Global: --; Provider Global: -- Swollen: --; Tender: -- Joint Exam 11/06/2023   No joint exam has been documented for this visit   There is currently no information documented on the homunculus. Go to the Rheumatology activity and complete the homunculus joint exam.  Investigation: No additional findings.  Imaging: No results found.  Recent Labs: Lab Results  Component Value Date   WBC 5.0 10/08/2023   HGB 14.4 10/08/2023   PLT 214 10/08/2023   NA 140 10/08/2023   K 4.7 10/08/2023   CL 104 10/08/2023   CO2 32 10/08/2023   GLUCOSE 80 10/08/2023   BUN 12 10/08/2023   CREATININE 0.88 10/08/2023   BILITOT 0.4 10/08/2023   ALKPHOS 66 09/15/2021   AST 19 10/08/2023   ALT 24 10/08/2023   PROT 6.6 10/08/2023   ALBUMIN 4.4 09/15/2021   CALCIUM  9.5 10/08/2023   GFRAA >60 05/30/2016   QFTBGOLDPLUS NEGATIVE 08/31/2023    Speciality Comments: No specialty comments available.  Procedures:  No procedures performed Allergies: Oxaliplatin  and Lisinopril   Assessment / Plan:     Visit Diagnoses: Psoriatic arthropathy (HCC) - Previous pt. [place] rheum.  Diagnosed on the basis of inflammatory arthritis, dactylitis and psoriasis:  Eric Lewis took Otezla  from 20 17-20 24.  Eric Lewis presented for initial consultation with Dr. Byron 08/31/2023.  Different treatment options were discussed in detail.  Eric Lewis was initiated on Tremfya  on 09/10/2023.  Eric Lewis has been tolerating Tremfya  without any side effects or injection site reactions.  Eric Lewis has noticed up to a 90% improvement in skin clearance since initiating Tremfya .  Eric Lewis has no synovitis or dactylitis on examination today.  No evidence of Achilles tendinitis or plantar fasciitis.  No SI joint tenderness upon palpation.  Eric Lewis on Tremfya  as prescribed.  Eric Lewis was advised to notify us  if Eric Lewis develops any new or worsening symptoms.  Eric Lewis will follow-up in the office in 3 months or sooner if needed.  Psoriasis: Scalp-Improved by 90% since initiating tremfya  on 09/10/23.   High risk medication use - Tremfya  100 mg sq injections every 8 weeks.  Completed loading dose of tremfya .   Previous tx: Otezla  started May 2017-October 2024 Eric Lewis discontinued due to chronic diarrhea.  Tremfya  was started on 09/10/23.  CBC and CMP updated on 10/08/23.  His next lab work will be due in March and every 3 months. Standing orders for CBC and CMP released today.  TB gold negative 08/31/23.   No recent or recurrent infections. Discussed the importance of holding tremfya  if Eric Lewis develops signs or symptoms of an infection and to resume once the infection has completely cleared.  Encouraged the Eric Lewis to update all necessary vaccines.   Contracture of joint of both elbows: No tenderness or inflammation noted.   Pain in both hands - Left fifth finger DIP joint contracture was noted without synovitis.  Limited extension of both wrist joints.  No synovitis or dactylitis noted.  Eric Lewis experiences intermittent stiffness in both hands but has no inflammation on examination today.   Pain in both feet: X-rays of both feet from 08/31/2023 were consistent with osteoarthritis.  Possible erosion was noted over the  left fifth metatarsal head.  Chronic pain of both ankles: X-rays of the left ankle were suggestive of possible inflammatory arthritis.  Possible erosion noted over the medial malleolus.  X-rays of the right ankle are unremarkable.  Eric Lewis continues to experience intermittent stiffness in the left ankle but has no synovitis on  exam.  No evidence of Achilles tendinitis or plantar fasciitis.  Other medical conditions are listed as follows:   History of diabetes mellitus  Dyslipidemia  Adenocarcinoma of rectum (HCC) - Status post rectum removal October 20, 2021.  Treated with chemotherapy and radiation therapy.  In remission residual left lower extremity numbness  Neuropathy: Eric Lewis remains on gabapentin as prescribed.   Fatty liver: LFTs WNL on 10/08/23.    Umbilical hernia without obstruction and without gangrene  History of esophageal stricture  Orders: No orders of the defined types were placed in this encounter.  No orders of the defined types were placed in this encounter.  Follow-Up Instructions: Return in about 3 months (around 02/04/2024) for Psoriatic arthritis.   Waddell CHRISTELLA Craze, PA-C  Note - This record has been created using Dragon software.  Chart creation errors have been sought, but may not always  have been located. Such creation errors do not reflect on  the standard of medical care.

## 2023-11-06 ENCOUNTER — Encounter: Payer: Self-pay | Admitting: Physician Assistant

## 2023-11-06 ENCOUNTER — Ambulatory Visit: Payer: Medicaid Other | Attending: Physician Assistant | Admitting: Physician Assistant

## 2023-11-06 VITALS — BP 130/83 | HR 74 | Resp 14 | Ht 67.0 in | Wt 196.0 lb

## 2023-11-06 DIAGNOSIS — M79671 Pain in right foot: Secondary | ICD-10-CM

## 2023-11-06 DIAGNOSIS — G8929 Other chronic pain: Secondary | ICD-10-CM

## 2023-11-06 DIAGNOSIS — C20 Malignant neoplasm of rectum: Secondary | ICD-10-CM

## 2023-11-06 DIAGNOSIS — M25571 Pain in right ankle and joints of right foot: Secondary | ICD-10-CM

## 2023-11-06 DIAGNOSIS — Z8639 Personal history of other endocrine, nutritional and metabolic disease: Secondary | ICD-10-CM

## 2023-11-06 DIAGNOSIS — M79642 Pain in left hand: Secondary | ICD-10-CM

## 2023-11-06 DIAGNOSIS — L4050 Arthropathic psoriasis, unspecified: Secondary | ICD-10-CM

## 2023-11-06 DIAGNOSIS — L409 Psoriasis, unspecified: Secondary | ICD-10-CM

## 2023-11-06 DIAGNOSIS — Z79899 Other long term (current) drug therapy: Secondary | ICD-10-CM

## 2023-11-06 DIAGNOSIS — M24521 Contracture, right elbow: Secondary | ICD-10-CM | POA: Diagnosis not present

## 2023-11-06 DIAGNOSIS — M79641 Pain in right hand: Secondary | ICD-10-CM

## 2023-11-06 DIAGNOSIS — K429 Umbilical hernia without obstruction or gangrene: Secondary | ICD-10-CM

## 2023-11-06 DIAGNOSIS — M25572 Pain in left ankle and joints of left foot: Secondary | ICD-10-CM

## 2023-11-06 DIAGNOSIS — M24522 Contracture, left elbow: Secondary | ICD-10-CM

## 2023-11-06 DIAGNOSIS — E785 Hyperlipidemia, unspecified: Secondary | ICD-10-CM

## 2023-11-06 DIAGNOSIS — Z8719 Personal history of other diseases of the digestive system: Secondary | ICD-10-CM

## 2023-11-06 DIAGNOSIS — M79672 Pain in left foot: Secondary | ICD-10-CM

## 2023-11-06 DIAGNOSIS — K760 Fatty (change of) liver, not elsewhere classified: Secondary | ICD-10-CM

## 2023-11-06 DIAGNOSIS — G629 Polyneuropathy, unspecified: Secondary | ICD-10-CM

## 2023-11-06 NOTE — Patient Instructions (Signed)
 Standing Labs We placed an order today for your standing lab work.   Please have your standing labs drawn in March and every 3 months   Please have your labs drawn 2 weeks prior to your appointment so that the provider can discuss your lab results at your appointment, if possible.  Please note that you may see your imaging and lab results in MyChart before we have reviewed them. We will contact you once all results are reviewed. Please allow our office up to 72 hours to thoroughly review all of the results before contacting the office for clarification of your results.  WALK-IN LAB HOURS  Monday through Thursday from 8:00 am -12:30 pm and 1:00 pm-5:00 pm and Friday from 8:00 am-12:00 pm.  Patients with office visits requiring labs will be seen before walk-in labs.  You may encounter longer than normal wait times. Please allow additional time. Wait times may be shorter on  Monday and Thursday afternoons.  We do not book appointments for walk-in labs. We appreciate your patience and understanding with our staff.   Labs are drawn by [facility]. Please bring your co-pay at the time of your lab draw.  You may receive a bill from [facility] for your lab work.  Please note if you are on Hydroxychloroquine and and an order has been placed for a Hydroxychloroquine level,  you will need to have it drawn 4 hours or more after your last dose.  If you wish to have your labs drawn at another location, please call the office 24 hours in advance so we can fax the orders.  The office is located at [address], [address], [place], [place] [address]   If you have any questions regarding directions or hours of operation,  please call [phone].   As a reminder, please drink plenty of water prior to coming for your lab work. Thanks!

## 2023-11-15 ENCOUNTER — Ambulatory Visit: Payer: Medicaid Other | Admitting: Rheumatology

## 2023-11-16 ENCOUNTER — Encounter: Payer: Self-pay | Admitting: Rheumatology

## 2023-11-16 NOTE — Telephone Encounter (Signed)
 I called patient, patient has contacted PCP.

## 2023-11-16 NOTE — Telephone Encounter (Signed)
 It would be unusual to have lip swelling so many weeks later if his allergic response.  Patient should see his PCP for the evaluation.

## 2023-11-23 ENCOUNTER — Other Ambulatory Visit: Payer: Self-pay

## 2023-11-23 NOTE — Progress Notes (Signed)
Specialty Pharmacy Refill Coordination Note  Eric Lewis is a 53 y.o. male contacted today regarding refills of specialty medication(s) Guselkumab [name])   Patient requested Delivery   Delivery date: 11/27/23   Verified address: [address]   [place] [address]   Medication will be filled on 11/26/23.

## 2023-11-26 ENCOUNTER — Other Ambulatory Visit: Payer: Self-pay

## 2024-01-10 ENCOUNTER — Other Ambulatory Visit (HOSPITAL_COMMUNITY): Payer: Self-pay

## 2024-01-11 ENCOUNTER — Other Ambulatory Visit: Payer: Self-pay

## 2024-01-11 ENCOUNTER — Other Ambulatory Visit: Payer: Self-pay | Admitting: Physician Assistant

## 2024-01-11 DIAGNOSIS — L409 Psoriasis, unspecified: Secondary | ICD-10-CM

## 2024-01-11 DIAGNOSIS — L4050 Arthropathic psoriasis, unspecified: Secondary | ICD-10-CM

## 2024-01-11 DIAGNOSIS — Z79899 Other long term (current) drug therapy: Secondary | ICD-10-CM

## 2024-01-11 MED ORDER — TREMFYA 100 MG/ML ~~LOC~~ SOAJ
100.0000 mg | SUBCUTANEOUS | 0 refills | Status: DC
  Filled 2024-01-11: qty 1, 56d supply, fill #0

## 2024-01-11 NOTE — Telephone Encounter (Signed)
 Last Fill: 09/27/2023  Labs: 10/08/2023 CBC and CMP are normal.   TB Gold: 08/31/2023 Neg    Next Visit: 01/29/2024  Last Visit: 13/31/2024  [id number] arthropathy   Current Dose per office note 11/06/2023: Tremfya 100 mg sq injections every 8 weeks   Patient to update labs at upcoming appointment on 01/29/2024  Okay to refill Tremfyia?

## 2024-01-11 NOTE — Progress Notes (Signed)
 Specialty Pharmacy Refill Coordination Note  Eric Lewis is a 53 y.o. male contacted today regarding refills of specialty medication(s) Guselkumab Eric Lewis)   Patient requested (Patient-Rptd) Delivery   Delivery date: (Patient-Rptd) 01/24/24   Verified address: (Patient-Rptd) [address] [place]   Medication will be filled on 03.19.25.

## 2024-01-16 NOTE — Progress Notes (Unsigned)
 Office Visit Note  Patient: Eric Lewis             Date of Birth: December 28, 1970           MRN: [id number]             PCP: Darrow Bussing, MD Referring: Darrow Bussing, MD Visit Date: 01/29/2024 Occupation: @GUAROCC @  Subjective:  Medication monitoring   History of Present Illness: Eric Lewis is a 53 y.o. male with history of psoriatic arthritis.  Patient remains on Tremfya 100 mg sq inj every 8 weeks.  Patient initiated Tremfya on 09/10/2023.  He continues to tolerate Tremfya without any side effects or injection site reactions.  His most recent dose of Tremfya was administered yesterday.  Patient states that in January he was 3 days overdue for his Tremfya injection but states that he had a recurrence of patches of psoriasis leading up to his dose.  He states he did not have any increased breakthrough symptoms leading up to his dose this month.  Patient states that several weeks ago he had some discomfort in his ankle joint for the first few steps after arising from a seated position but the symptoms have resolved and he does not recall which ankle was involved.  He denies any evidence of Achilles tendinitis or plantar fasciitis.  He denies any SI joint pain.  He denies any nocturnal symptoms.  He has not had any joint swelling.  He denies any recent or recurrent infections.    Activities of Daily Living:  Patient denies morning stiffness Patient Denies nocturnal pain.  Difficulty dressing/grooming: Denies Difficulty climbing stairs: Denies Difficulty getting out of chair: Denies Difficulty using hands for taps, buttons, cutlery, and/or writing: Denies  Review of Systems  Constitutional:  Negative for fatigue.  HENT:  Negative for mouth sores and mouth dryness.   Eyes:  Negative for dryness.  Respiratory:  Negative for shortness of breath.   Cardiovascular:  Negative for chest pain and palpitations.  Gastrointestinal:  Positive for diarrhea. Negative for blood in stool  and constipation.  Endocrine: Negative for increased urination.  Genitourinary:  Negative for involuntary urination.  Musculoskeletal:  Negative for joint pain, gait problem, joint pain, joint swelling, myalgias, muscle weakness, morning stiffness, muscle tenderness and myalgias.  Skin:  Negative for color change, rash, hair loss and sensitivity to sunlight.  Allergic/Immunologic: Negative for susceptible to infections.  Neurological:  Negative for dizziness and headaches.  Hematological:  Negative for swollen glands.  Psychiatric/Behavioral:  Negative for depressed mood and sleep disturbance. The patient is not nervous/anxious.     PMFS History:  Patient Active Problem List   Diagnosis Date Noted   Encounter for antineoplastic chemotherapy 05/03/2021   Genetic testing 03/20/2021   Family history of breast cancer    Family history of prostate cancer    Adenocarcinoma of rectum (HCC) 02/24/2021   Septic arthritis of knee, right (HCC) 09/30/2014   Diabetes mellitus (HCC) 09/30/2014   Dyslipidemia 09/30/2014   Obesity 09/30/2014    Past Medical History:  Diagnosis Date   COVID jan 30 th 2022   fever x 4 days all sx resolved   DM type 2 (diabetes mellitus, type 2) (HCC)    Family history of breast cancer    Family history of prostate cancer    History of hypertension    no meds x 1 year due to weight loss as of 03-16-2021   Hypercholesteremia    Obesity    Psoriatic arthritis (  HCC)    Rectal cancer (HCC)    Umbilical hernia     Family History  Problem Relation Age of Onset   Emphysema Father    Prostate cancer Paternal Uncle 30   Emphysema Maternal Grandmother    Breast cancer Paternal Grandmother        dx 67s   Past Surgical History:  Procedure Laterality Date   CHONDROPLASTY Right 09/28/2014   Procedure: CHONDROPLASTY, PATELLA/FEMORAL;  Surgeon: Harvie Junior, MD;  Location: [facility];  Service: Orthopedics;  Laterality: Right;   colonscopy  03-17-2021    [facility]   ILEOSTOMY CLOSURE  03/27/2023   IR CV LINE INJECTION  09/23/2021   IR REMOVAL TUN ACCESS W/ PORT W/O FL MOD SED  09/30/2021   KNEE ARTHROSCOPY WITH MEDIAL MENISECTOMY Right 09/28/2014   Procedure: KNEE ARTHROSCOPY WITH MEDIAL MENISECTOMY;  Surgeon: Harvie Junior, MD;  Location: [facility];  Service: Orthopedics;  Laterality: Right;   PORTACATH PLACEMENT Right 03/18/2021   Procedure: PLACEMENT PORT-A-CATH;  Surgeon: Andria Meuse, MD;  Location: [facility];  Service: General;  Laterality: Right;   ROBOTIC COLECTOMY, PARTIAL, WITH ANASTOMOSIS, WITH COLOPROCTOSTOMY (LOW PELVIC ANASTOMOSIS) WITH IIEOSTOMY  10/20/2021   [facility]   SYNOVECTOMY Right 09/28/2014   Procedure: SYNOVECTOMY, THREE COMPARTMENT;  Surgeon: Harvie Junior, MD;  Location: [facility];  Service: Orthopedics;  Laterality: Right;   Social History   Social History Narrative   Reports he is the middle child of two other siblings. Lives alone and works from home in [employer].      Right Handed    Lives in a two story home    Immunization History  Administered Date(s) Administered   Influenza Split 07/27/2014   PFIZER(Purple Top)SARS-COV-2 Vaccination 02/05/2020, 03/01/2020   Pneumococcal Polysaccharide-23 12/04/2014   Tdap 09/09/2012, 05/08/2022     Objective: Vital Signs: BP 115/75 (BP Location: Left Arm, Patient Position: Sitting, Cuff Size: Normal)   Pulse 72   Resp 15   Ht 5\' 7"  (1.702 m)   Wt 199 lb (90.3 kg)   BMI 31.17 kg/m    Physical Exam Vitals and nursing note reviewed.  Constitutional:      Appearance: He is well-developed.  HENT:     Head: Normocephalic and atraumatic.  Eyes:     Conjunctiva/sclera: Conjunctivae normal.     Pupils: Pupils are equal, round, and reactive to light.  Cardiovascular:     Rate and Rhythm: Normal rate and regular rhythm.     Heart sounds: Normal heart sounds.  Pulmonary:     Effort: Pulmonary effort is  normal.     Breath sounds: Normal breath sounds.  Abdominal:     General: Bowel sounds are normal.     Palpations: Abdomen is soft.  Musculoskeletal:     Cervical back: Normal range of motion and neck supple.  Skin:    General: Skin is warm and dry.     Capillary Refill: Capillary refill takes less than 2 seconds.  Neurological:     Mental Status: He is alert and oriented to person, place, and time.  Psychiatric:        Behavior: Behavior normal.      Musculoskeletal Exam: C-spine has slightly limited range of motion bilateral rotation.  No midline spinal tenderness.  No SI joint tenderness.  Shoulder joints have good range of motion.  Flexion contractures of both elbows but no tenderness or warmth along the joint line.  Limited range of motion of both wrist joints especially with extension.  PIP and DIP thickening noted.  Flexion contracture of the left fifth DIP.  Hip joints have good range of motion with no groin pain.  Knee joints have good range of motion with no warmth or effusion.  Ankle joints have good range of motion with no tenderness or joint swelling.  No evidence of Achilles tendinitis.  CDAI Exam: CDAI Score: -- Patient Global: --; Provider Global: -- Swollen: --; Tender: -- Joint Exam 01/29/2024   No joint exam has been documented for this visit   There is currently no information documented on the homunculus. Go to the Rheumatology activity and complete the homunculus joint exam.  Investigation: No additional findings.  Imaging: CT CHEST ABDOMEN PELVIS W CONTRAST Result Date: 01/22/2024 CLINICAL DATA:  Follow-up history of rectal cancer. * Tracking Code: BO * EXAM: CT CHEST, ABDOMEN, AND PELVIS WITH CONTRAST TECHNIQUE: Multidetector CT imaging of the chest, abdomen and pelvis was performed following the standard protocol during bolus administration of intravenous contrast. RADIATION DOSE REDUCTION: This exam was performed according to the departmental  dose-optimization program which includes automated exposure control, adjustment of the mA and/or kV according to patient size and/or use of iterative reconstruction technique. CONTRAST:  OMNIPAQUE IOHEXOL 300 MG/ML  SOLN COMPARISON:  Multiple priors including CT January 22, 2023 FINDINGS: CT CHEST FINDINGS Cardiovascular: No significant vascular findings. Normal heart size. No pericardial effusion. Mediastinum/Nodes: No suspicious thyroid nodule. No pathologically enlarged mediastinal, hilar or axillary lymph nodes. Esophagus is grossly unremarkable. Lungs/Pleura: No suspicious pulmonary nodules or masses. No pleural effusion. No pneumothorax. Musculoskeletal: No aggressive lytic or blastic lesion of bone. CT ABDOMEN PELVIS FINDINGS Hepatobiliary: No focal liver abnormality is seen. No gallstones, gallbladder wall thickening, or biliary dilatation. Pancreas: Unremarkable. No pancreatic ductal dilatation or surrounding inflammatory changes. Spleen: Normal in size without focal abnormality. Adrenals/Urinary Tract: Adrenal glands are unremarkable. Kidneys are normal, without renal calculi, focal lesion, or hydronephrosis. Bladder is unremarkable. Stomach/Bowel: Radiopaque enteric contrast material traverses the rectum. No pathologic dilation of small or large bowel. Prior low anterior resection with reanastomosis. Question of increased mesorectal/presacral soft tissue for instance on image 109/2 now measuring 6.7 x 3.6 cm previously 6.7 x 3.4 cm, although this may at least in part be related to the increased rectal distension with inclusion of the posterior wall of the rectum within this soft tissue area. Vascular/Lymphatic: Normal caliber abdominal aorta. Smooth IVC contours. The portal, splenic and superior mesenteric veins are patent. Reproductive: Prostate is unremarkable. Other: Trocar site hernia in the right anterior abdominal wall contains fat and nonobstructed portion of bowel. Fat containing paraumbilical  hernia. Musculoskeletal: No aggressive lytic or blastic lesion of bone. IMPRESSION: 1. Prior low anterior resection with reanastomosis. Question of increased mesorectal/presacral soft tissue, although this may at least in part be related to the increased rectal distension with inclusion of the posterior wall of the rectum within this soft tissue area. Suggest further evaluation with rectal MRI with and without contrast. 2. No evidence of metastatic disease in the chest. 3. Trocar site hernia in the right anterior abdominal wall contains fat and nonobstructed portion of bowel. Fat containing paraumbilical hernia. Electronically Signed   By: Maudry Mayhew M.D.   On: 01/22/2024 16:23    Recent Labs: Lab Results  Component Value Date   WBC 5.0 10/08/2023   HGB 14.4 10/08/2023   PLT 214 10/08/2023   NA 139 01/22/2024   K 4.4 01/22/2024  CL 105 01/22/2024   CO2 30 01/22/2024   GLUCOSE 90 01/22/2024   BUN 9 01/22/2024   CREATININE 0.95 01/22/2024   BILITOT 0.4 10/08/2023   ALKPHOS 66 09/15/2021   AST 19 10/08/2023   ALT 24 10/08/2023   PROT 6.6 10/08/2023   ALBUMIN 4.4 09/15/2021   CALCIUM 9.0 01/22/2024   GFRAA >60 05/30/2016   QFTBGOLDPLUS NEGATIVE 08/31/2023    Speciality Comments: No specialty comments available.  Procedures:  No procedures performed Allergies: Oxaliplatin and Lisinopril   Assessment / Plan:     Visit Diagnoses: Psoriatic arthropathy (HCC): No synovitis or dactylitis noted on examination.  No evidence of Achilles tendinitis or plantar fasciitis.  No SI joint tenderness upon palpation.  Patient remains on Tremfya 100 mg sq injections every 8 weeks.  His most recent dose of Tremfya was administered yesterday.  According to the patient in January 2025 he had a mild flare of psoriasis leading up to his Tremfya injection.  He did not seem to have any breakthrough symptoms leading up to his injection yesterday.  He was advised to notify us if he starts to have a recurrence  of breakthrough symptoms leading up to each dose of Tremfya going forward.  He declined needing any topical agents at this time.  He was advised to notify us if he develops any other new or worsening symptoms.  He will follow-up in the office in 5 months or sooner if needed.  Psoriasis: History of psoriasis on the scalp, behind both ears, and gluteal crease-improved since initiating Tremfya.  Not using any topical agents at this time.  He was advised to notify us if he has any breakthrough symptoms of psoriasis leading up to his dose of Tremfya going forward.   High risk medication use - Tremfya 100 mg sq inj every 8 weeks. Previous tx: [facility] started May 2017-October 2024 patient d/c due to chronic diarrhea.  Tremfya was initiated on 09/10/2023. BMP updated on 01/22/24. Hepatic function panel released.   CBC updated on 10/08/23. Order for CBC with diff released.  His next lab work will be due in June and every 3 months to monitor for drug toxicity.  Standing orders for CBC and CMP remain in place. TB gold negative on 08/31/23. No recent or recurrent infections.  Discussed the importance of holding tremfya if he develops signs or symptoms of an infection and to resume once the infection has completely cleared. - Plan: CBC with Differential/Platelet, Hepatic function panel  Contracture of joint of both elbows: Unchanged.  No tenderness or inflammation noted.   Pain in both hands: Limited ROM of both wrists especially with extension.  No inflammation noted.   Pain in both feet - X-rays of both feet from 08/31/2023 were consistent with osteoarthritis.  Possible erosion was noted over the left fifth metatarsal head.  He is wearing proper fitting shoes.   Chronic pain of both ankles: No tenderness or inflammation noted on examination today. No evidence of achilles tendonitis.    Other medical conditions are listed as follows:   History of diabetes mellitus  Dyslipidemia: He remains on zocor as  prescribed.   Adenocarcinoma of rectum [facility]) - Status post rectum removal October 20, 2021.  Treated with chemotherapy and radiation therapy.  In remission residual left lower extremity numbness  Neuropathy: He is taking gabapentin as prescribed.    Fatty liver: Hepatic function panel updated today.  Umbilical hernia without obstruction and without gangrene  History of esophageal stricture  Orders:  Orders Placed This Encounter  Procedures   CBC with Differential/Platelet   Hepatic function panel   No orders of the defined types were placed in this encounter.    Follow-Up Instructions: Return in about 5 months (around 06/30/2024) for Psoriatic arthritis.   Gearldine Bienenstock, PA-C  Note - This record has been created using Dragon software.  Chart creation errors have been sought, but may not always  have been located. Such creation errors do not reflect on  the standard of medical care.

## 2024-01-22 ENCOUNTER — Inpatient Hospital Stay: Payer: Self-pay | Attending: Oncology

## 2024-01-22 ENCOUNTER — Ambulatory Visit (HOSPITAL_COMMUNITY): Payer: Medicaid Other

## 2024-01-22 ENCOUNTER — Other Ambulatory Visit: Payer: Medicaid Other

## 2024-01-22 ENCOUNTER — Ambulatory Visit (HOSPITAL_BASED_OUTPATIENT_CLINIC_OR_DEPARTMENT_OTHER)
Admission: RE | Admit: 2024-01-22 | Discharge: 2024-01-22 | Disposition: A | Discharge status: Home / Self Care | Source: Ambulatory Visit | Attending: Oncology | Admitting: Oncology

## 2024-01-22 DIAGNOSIS — E119 Type 2 diabetes mellitus without complications: Secondary | ICD-10-CM | POA: Insufficient documentation

## 2024-01-22 DIAGNOSIS — R194 Change in bowel habit: Secondary | ICD-10-CM | POA: Insufficient documentation

## 2024-01-22 DIAGNOSIS — Z8616 Personal history of COVID-19: Secondary | ICD-10-CM | POA: Diagnosis not present

## 2024-01-22 DIAGNOSIS — C20 Malignant neoplasm of rectum: Secondary | ICD-10-CM | POA: Insufficient documentation

## 2024-01-22 LAB — BASIC METABOLIC PANEL - CANCER CENTER ONLY
Anion gap: 4 — ABNORMAL LOW (ref 5–15)
BUN: 9 mg/dL (ref 6–20)
CO2: 30 mmol/L (ref 22–32)
Calcium: 9 mg/dL (ref 8.9–10.3)
Chloride: 105 mmol/L (ref 98–111)
Creatinine: 0.95 mg/dL (ref 0.61–1.24)
GFR, Estimated: >60 mL/min (ref >60)
Glucose, Bld: 90 mg/dL (ref 70–99)
Potassium: 4.4 mmol/L (ref 3.5–5.1)
Sodium: 139 mmol/L (ref 135–145)

## 2024-01-22 LAB — CEA (ACCESS): CEA (CHCC): 1.9 ng/mL (ref 0.00–5.00)

## 2024-01-22 MED ORDER — IOHEXOL 300 MG/ML  SOLN
100.0000 mL | Freq: Once | INTRAMUSCULAR | Status: AC | PRN
  Administered 2024-01-22: 100 mL via INTRAVENOUS

## 2024-01-23 ENCOUNTER — Other Ambulatory Visit: Payer: Self-pay

## 2024-01-23 ENCOUNTER — Ambulatory Visit: Payer: Medicaid Other | Admitting: Oncology

## 2024-01-29 ENCOUNTER — Ambulatory Visit: Payer: Medicaid Other | Attending: Physician Assistant | Admitting: Physician Assistant

## 2024-01-29 ENCOUNTER — Encounter: Payer: Self-pay | Admitting: Physician Assistant

## 2024-01-29 VITALS — BP 115/75 | HR 72 | Resp 15 | Ht 67.0 in | Wt 199.0 lb

## 2024-01-29 DIAGNOSIS — Z8719 Personal history of other diseases of the digestive system: Secondary | ICD-10-CM

## 2024-01-29 DIAGNOSIS — L409 Psoriasis, unspecified: Secondary | ICD-10-CM | POA: Diagnosis not present

## 2024-01-29 DIAGNOSIS — M79671 Pain in right foot: Secondary | ICD-10-CM

## 2024-01-29 DIAGNOSIS — M79641 Pain in right hand: Secondary | ICD-10-CM

## 2024-01-29 DIAGNOSIS — L4050 Arthropathic psoriasis, unspecified: Secondary | ICD-10-CM

## 2024-01-29 DIAGNOSIS — C20 Malignant neoplasm of rectum: Secondary | ICD-10-CM

## 2024-01-29 DIAGNOSIS — M79672 Pain in left foot: Secondary | ICD-10-CM

## 2024-01-29 DIAGNOSIS — K429 Umbilical hernia without obstruction or gangrene: Secondary | ICD-10-CM

## 2024-01-29 DIAGNOSIS — M25571 Pain in right ankle and joints of right foot: Secondary | ICD-10-CM

## 2024-01-29 DIAGNOSIS — G629 Polyneuropathy, unspecified: Secondary | ICD-10-CM

## 2024-01-29 DIAGNOSIS — M24522 Contracture, left elbow: Secondary | ICD-10-CM

## 2024-01-29 DIAGNOSIS — E785 Hyperlipidemia, unspecified: Secondary | ICD-10-CM

## 2024-01-29 DIAGNOSIS — M24521 Contracture, right elbow: Secondary | ICD-10-CM | POA: Diagnosis not present

## 2024-01-29 DIAGNOSIS — K760 Fatty (change of) liver, not elsewhere classified: Secondary | ICD-10-CM

## 2024-01-29 DIAGNOSIS — M25572 Pain in left ankle and joints of left foot: Secondary | ICD-10-CM

## 2024-01-29 DIAGNOSIS — Z79899 Other long term (current) drug therapy: Secondary | ICD-10-CM

## 2024-01-29 DIAGNOSIS — M79642 Pain in left hand: Secondary | ICD-10-CM

## 2024-01-29 DIAGNOSIS — Z8639 Personal history of other endocrine, nutritional and metabolic disease: Secondary | ICD-10-CM

## 2024-01-29 DIAGNOSIS — G8929 Other chronic pain: Secondary | ICD-10-CM

## 2024-01-29 NOTE — Patient Instructions (Signed)

## 2024-01-30 ENCOUNTER — Encounter: Payer: Self-pay | Admitting: *Deleted

## 2024-01-30 ENCOUNTER — Telehealth: Payer: Self-pay | Admitting: Oncology

## 2024-01-30 ENCOUNTER — Encounter: Payer: Self-pay | Admitting: Oncology

## 2024-01-30 ENCOUNTER — Inpatient Hospital Stay: Payer: Medicaid Other | Admitting: Oncology

## 2024-01-30 VITALS — BP 117/80 | HR 73 | Temp 98.1°F | Resp 18 | Ht 67.0 in | Wt 199.0 lb

## 2024-01-30 DIAGNOSIS — C20 Malignant neoplasm of rectum: Secondary | ICD-10-CM

## 2024-01-30 LAB — HEPATIC FUNCTION PANEL
AG Ratio: 1.8 (calc) (ref 1.0–2.5)
ALT: 36 U/L (ref 9–46)
AST: 20 U/L (ref 10–35)
Albumin: 4.3 g/dL (ref 3.6–5.1)
Alkaline phosphatase (APISO): 87 U/L (ref 35–144)
Bilirubin, Direct: 0.2 mg/dL (ref 0.0–0.2)
Globulin: 2.4 g/dL (ref 1.9–3.7)
Indirect Bilirubin: 0.6 mg/dL (ref 0.2–1.2)
Total Bilirubin: 0.8 mg/dL (ref 0.2–1.2)
Total Protein: 6.7 g/dL (ref 6.1–8.1)

## 2024-01-30 LAB — CBC WITH DIFFERENTIAL/PLATELET
Absolute Lymphocytes: 1203 {cells}/uL (ref 850–3900)
Absolute Monocytes: 616 {cells}/uL (ref 200–950)
Basophils Absolute: 63 {cells}/uL (ref 0–200)
Basophils Relative: 1.1 %
Eosinophils Absolute: 91 {cells}/uL (ref 15–500)
Eosinophils Relative: 1.6 %
HCT: 43.6 % (ref 38.5–50.0)
Hemoglobin: 14.7 g/dL (ref 13.2–17.1)
MCH: 29.2 pg (ref 27.0–33.0)
MCHC: 33.7 g/dL (ref 32.0–36.0)
MCV: 86.7 fL (ref 80.0–100.0)
MPV: 9 fL (ref 7.5–12.5)
Monocytes Relative: 10.8 %
Neutro Abs: 3728 {cells}/uL (ref 1500–7800)
Neutrophils Relative %: 65.4 %
Platelets: 223 10*3/uL (ref 140–400)
RBC: 5.03 10*6/uL (ref 4.20–5.80)
RDW: 13 % (ref 11.0–15.0)
Total Lymphocyte: 21.1 %
WBC: 5.7 10*3/uL (ref 3.8–10.8)

## 2024-01-30 NOTE — Telephone Encounter (Signed)
 Spoke with patient confirming upcoming appointment

## 2024-01-30 NOTE — Progress Notes (Signed)
CBC and hepatic function panel WNL

## 2024-01-30 NOTE — Progress Notes (Addendum)
 [facility] OFFICE PROGRESS NOTE   Diagnosis: Rectal cancer  INTERVAL HISTORY:   [name] returns as scheduled.  Good appetite and energy level.  He continues to have frequent bowel movements, though this has improved with Colestid.  He had recent episode of rectal bleeding after multiple bowel movements.  Left foot pain is improved with gabapentin. He reports undergoing a colonoscopy by Dr. Pati Gallo in November 2024.  There was no evidence of malignancy.  Objective:  Vital signs in last 24 hours:  Blood pressure 117/80, pulse 73, temperature 98.1 F (36.7 C), temperature source Temporal, resp. rate 18, height 5\' 7"  (1.702 m), weight 199 lb (90.3 kg), SpO2 100%.    Lymphatics: No cervical, supraclavicular, axillary, or inguinal nodes Resp: Lungs clear bilaterally Cardio: Rate and rhythm GI: Hepatomegaly, nontender, no mass Vascular: No leg edema  Lab Results:  Lab Results  Component Value Date   WBC 5.7 01/29/2024   HGB 14.7 01/29/2024   HCT 43.6 01/29/2024   MCV 86.7 01/29/2024   PLT 223 01/29/2024   NEUTROABS 3,728 01/29/2024    CMP  Lab Results  Component Value Date   NA 139 01/22/2024   K 4.4 01/22/2024   CL 105 01/22/2024   CO2 30 01/22/2024   GLUCOSE 90 01/22/2024   BUN 9 01/22/2024   CREATININE 0.95 01/22/2024   CALCIUM 9.0 01/22/2024   PROT 6.7 01/29/2024   ALBUMIN 4.4 09/15/2021   AST 20 01/29/2024   ALT 36 01/29/2024   ALKPHOS 66 09/15/2021   BILITOT 0.8 01/29/2024   GFRNONAA >60 01/22/2024   GFRAA >60 05/30/2016    Lab Results  Component Value Date   CEA 1.90 01/22/2024    Medications: I have reviewed the patient's current medications.   Assessment/Plan:  Rectal cancer Colonoscopy 02/15/2021- nonobstructing mass at 2-3 cm in the anal verge, removed in a piecemeal fashion, pathology confirmed moderately differentiated adenocarcinoma CTs chest, abdomen, and pelvis on 02/17/2021- no evidence of metastatic disease, no clear  mass identified, areas of mild rectal wall thickening MRI 03/02/2021- rectal tumor not identified, no extension beyond the muscularis identified.  N1 disease with multiple left mesorectal nodes, and a 7 mm node beyond the mesorectum at the hypogastric region Cycle 1 FOLFOX 03/22/2021 Cycle 2 FOLFOX 04/05/2021 Cycle 3 FOLFOX 04/19/2021 Cycle 4 FOLFOX 05/03/2021 Cycle 5 FOLFOX 05/17/2021 Cycle 6 FOLFOX 05/31/2021 Cycle 7 FOLFOX 06/14/2021-Oxaliplatin infusion time lengthened, Benadryl and Pepcid added to premedications due to coughing, sneezing, throat clearing, diaphoresis during Oxaliplatin cycle 6 Cycle 8 FOLFOX 06/28/2021 Radiation/Xeloda 07/12/2021-08/18/2021 MRI pelvis 08/25/2021 at [facility]-mid rectal tumor not definitely visualized with persistent irregular anterior rectal wall thickening and redemonstrated irregularity of the muscularis propria.  Decreased size of left mesorectal and left internal iliac lymph nodes. Low anterior resection and diverting loop ileostomy 10/20/2021,ypT0,ypN0, submucosal fibrosis and treatment effect without evidence of residual tumor MRI pelvis 12/20/2021-thick walled rim-enhancing gas and fluid collection in the presacral space communicating with the coloanal anastomosis consistent with a leak and small abscess MRI lumbar spine 01/08/2022-no abnormal enhancement of the vertebral bodies, conus medullaris, cauda equina, paraspinal structures CTs 03/03/2022-no metastatic disease. CTs 01/22/2023-no evidence of recurrent disease Colonoscopy 09/13/2023: Nodular erythematous and inflamed mucosa was found in the rectum, biopsy-inflammatory granulation tissue, negative for dysplasia, random colon biopsy with no significant pathologic diagnosis, negative for active colitis CTs 01/22/2024-no evidence of metastatic disease, increased presacral soft tissue   History of dysphagia-status post a barium swallow 10/22/2020- mild smooth stricture at the GE junction  Psoriatic  arthritis Diabetes COVID-19 January 22 Coughing, sneezing, throat clearing, diaphoresis during Oxaliplatin infusion cycle 6 FOLFOX Anaphylactic reaction to oxaliplatin 06/28/2021 Removal of malfunctioning Port-A-Cath 09/30/2021 Left calf muscle injury during surgery 10/20/2021-MRI of the left tibia/fibula-myonecrosis in the posterior muscle compartments History of rectal anastomotic leak-Gastrografin enema 12/20/2022-no leak Ileostomy takedown 03/27/2023       Disposition: Eric Lewis remains in clinical remission from colon cancer.  He continues to have increased stool frequency.  This has improved with Colestid.  We will follow-up on the colonoscopy from Dr. Marca Ancona.  Mr. [name] will return for an office visit and CEA in 6 months.  The increased pretracheal soft tissue is likely benign posttreatment finding.  I reviewed the CT images with Eric Lewis.  He will asked Dr. Daryl Eastern to review the images when [facility].  I will present his case at the GI tumor conference for further review of the CTs.  Thornton Papas, MD  01/30/2024  10:20 AM

## 2024-02-06 ENCOUNTER — Other Ambulatory Visit: Payer: Self-pay

## 2024-02-06 ENCOUNTER — Other Ambulatory Visit: Payer: Self-pay | Admitting: *Deleted

## 2024-02-06 ENCOUNTER — Telehealth: Payer: Self-pay | Admitting: *Deleted

## 2024-02-06 DIAGNOSIS — C20 Malignant neoplasm of rectum: Secondary | ICD-10-CM

## 2024-02-06 NOTE — Telephone Encounter (Signed)
 LVM for Mr. Lehrmann to check MyChart for recommendations from GI Conference today.

## 2024-02-08 ENCOUNTER — Encounter: Payer: Self-pay | Admitting: Gastroenterology

## 2024-02-11 NOTE — Progress Notes (Signed)
 The proposed treatment discussed in conference is for discussion purpose only and is not a binding recommendation.  The patients have not been physically examined, or presented with their treatment options.  Therefore, final treatment plans cannot be decided.

## 2024-03-06 ENCOUNTER — Other Ambulatory Visit (HOSPITAL_COMMUNITY): Payer: Self-pay

## 2024-03-06 ENCOUNTER — Other Ambulatory Visit: Payer: Self-pay | Admitting: Physician Assistant

## 2024-03-06 ENCOUNTER — Other Ambulatory Visit: Payer: Self-pay

## 2024-03-06 DIAGNOSIS — L409 Psoriasis, unspecified: Secondary | ICD-10-CM

## 2024-03-06 DIAGNOSIS — L4050 Arthropathic psoriasis, unspecified: Secondary | ICD-10-CM

## 2024-03-06 DIAGNOSIS — Z79899 Other long term (current) drug therapy: Secondary | ICD-10-CM

## 2024-03-06 MED ORDER — TREMFYA 100 MG/ML ~~LOC~~ SOAJ
100.0000 mg | SUBCUTANEOUS | 0 refills | Status: DC
  Filled 2024-03-06: qty 1, 56d supply, fill #0

## 2024-03-06 NOTE — Progress Notes (Signed)
 Specialty Pharmacy Ongoing Clinical Assessment Note  Polly Brink Mettler is a 53 y.o. male who is being followed by the specialty pharmacy service for RxSp Psoriatic Arthritis   Patient's specialty medication(s) reviewed today: Guselkumab  (Tremfya )   Missed doses in the last 4 weeks: 0   Patient/Caregiver did not have any additional questions or concerns.   Therapeutic benefit summary: Patient is achieving benefit   Adverse events/side effects summary: No adverse events/side effects   Patient's therapy is appropriate to: Continue    Goals Addressed             This Visit's Progress    Comply with lab assessments   On track    Patient is initiating therapy. Patient will adhere to provider and/or lab appointments and be monitored by provider to determine if a change in treatment plan is warranted. Repeat CBC/CMP in 1 month then every 3 months      Maintain optimal adherence to therapy   On track    Patient is initiating therapy. Patient will maintain adherence         Follow up:  6 months  Malachi Screws Specialty Pharmacist

## 2024-03-06 NOTE — Progress Notes (Signed)
 Specialty Pharmacy Refill Coordination Note  Eric Lewis is a 53 y.o. male contacted today regarding refills of specialty medication(s) Guselkumab  (Tremfya )   Patient requested Delivery   Delivery date: 03/18/24   Verified address: [address] [place]   Medication will be filled on 03/17/24.This fill date is pending response to refill request from provider. Patient is aware and if they have not received fill by intended date they must follow up with pharmacy.

## 2024-03-06 NOTE — Telephone Encounter (Signed)
 Last Fill: 01/11/2024  Labs: 01/29/2024  CBC and hepatic function panel WNL 01/22/2024 BMP Anion gap 4  TB Gold: 08/31/2023  TB Gold negative   Next Visit: 07/01/2024  Last Visit: 01/29/2024  [id number] arthropathy   Current Dose per office note 01/29/2024: Tremfya  100 mg sq inj every 8 weeks.   Okay to refill Tremfyia?

## 2024-03-25 ENCOUNTER — Other Ambulatory Visit: Payer: Self-pay

## 2024-03-25 ENCOUNTER — Other Ambulatory Visit: Payer: Self-pay | Admitting: Rheumatology

## 2024-03-25 DIAGNOSIS — L409 Psoriasis, unspecified: Secondary | ICD-10-CM

## 2024-03-25 DIAGNOSIS — Z79899 Other long term (current) drug therapy: Secondary | ICD-10-CM

## 2024-03-25 DIAGNOSIS — L4050 Arthropathic psoriasis, unspecified: Secondary | ICD-10-CM

## 2024-03-26 ENCOUNTER — Other Ambulatory Visit: Payer: Self-pay

## 2024-03-26 MED ORDER — TREMFYA ONE-PRESS 100 MG/ML ~~LOC~~ SOAJ
100.0000 mg | SUBCUTANEOUS | 0 refills | Status: DC
  Filled 2024-03-26 – 2024-05-12 (×2): qty 1, 56d supply, fill #0

## 2024-03-26 NOTE — Progress Notes (Signed)
 New Rx received for Tremfya  [facility]. Rph must counsel before next refill.

## 2024-04-28 ENCOUNTER — Other Ambulatory Visit: Payer: Self-pay | Admitting: *Deleted

## 2024-04-28 DIAGNOSIS — L409 Psoriasis, unspecified: Secondary | ICD-10-CM

## 2024-04-28 DIAGNOSIS — Z79899 Other long term (current) drug therapy: Secondary | ICD-10-CM

## 2024-04-28 DIAGNOSIS — L4050 Arthropathic psoriasis, unspecified: Secondary | ICD-10-CM

## 2024-04-29 ENCOUNTER — Ambulatory Visit: Payer: Self-pay | Admitting: Physician Assistant

## 2024-04-29 LAB — CBC WITH DIFFERENTIAL/PLATELET
Absolute Lymphocytes: 1659 {cells}/uL (ref 850–3900)
Absolute Monocytes: 571 {cells}/uL (ref 200–950)
Basophils Absolute: 61 {cells}/uL (ref 0–200)
Basophils Relative: 0.9 %
Eosinophils Absolute: 129 {cells}/uL (ref 15–500)
Eosinophils Relative: 1.9 %
HCT: 44.9 % (ref 38.5–50.0)
Hemoglobin: 14.7 g/dL (ref 13.2–17.1)
MCH: 29.1 pg (ref 27.0–33.0)
MCHC: 32.7 g/dL (ref 32.0–36.0)
MCV: 88.9 fL (ref 80.0–100.0)
MPV: 9.5 fL (ref 7.5–12.5)
Monocytes Relative: 8.4 %
Neutro Abs: 4379 {cells}/uL (ref 1500–7800)
Neutrophils Relative %: 64.4 %
Platelets: 208 10*3/uL (ref 140–400)
RBC: 5.05 10*6/uL (ref 4.20–5.80)
RDW: 12.8 % (ref 11.0–15.0)
Total Lymphocyte: 24.4 %
WBC: 6.8 10*3/uL (ref 3.8–10.8)

## 2024-04-29 LAB — COMPLETE METABOLIC PANEL WITHOUT GFR
AG Ratio: 2.1 (calc) (ref 1.0–2.5)
ALT: 22 U/L (ref 9–46)
AST: 17 U/L (ref 10–35)
Albumin: 4.2 g/dL (ref 3.6–5.1)
Alkaline phosphatase (APISO): 86 U/L (ref 35–144)
BUN: 11 mg/dL (ref 7–25)
CO2: 28 mmol/L (ref 20–32)
Calcium: 9.3 mg/dL (ref 8.6–10.3)
Chloride: 103 mmol/L (ref 98–110)
Creat: 0.96 mg/dL (ref 0.70–1.30)
Globulin: 2 g/dL (ref 1.9–3.7)
Glucose, Bld: 87 mg/dL (ref 65–99)
Potassium: 4.4 mmol/L (ref 3.5–5.3)
Sodium: 140 mmol/L (ref 135–146)
Total Bilirubin: 0.6 mg/dL (ref 0.2–1.2)
Total Protein: 6.2 g/dL (ref 6.1–8.1)

## 2024-05-06 ENCOUNTER — Other Ambulatory Visit (HOSPITAL_COMMUNITY): Payer: Self-pay

## 2024-05-12 ENCOUNTER — Other Ambulatory Visit: Payer: Self-pay | Admitting: Pharmacy Technician

## 2024-05-12 ENCOUNTER — Other Ambulatory Visit: Payer: Self-pay

## 2024-05-12 ENCOUNTER — Encounter (INDEPENDENT_AMBULATORY_CARE_PROVIDER_SITE_OTHER): Payer: Self-pay

## 2024-05-12 ENCOUNTER — Telehealth: Payer: Self-pay | Admitting: Pharmacist

## 2024-05-12 NOTE — Progress Notes (Signed)
 Specialty Pharmacy Refill Coordination Note  Eric Lewis is a 53 y.o. male contacted today regarding refills of specialty medication(s) Guselkumab  (Tremfya  [facility])   Patient requested (Patient-Rptd) Delivery   Delivery date: 05/15/24   Verified address: (Patient-Rptd) [address], [place], [place]   Medication will be filled on 05/14/24.   Patient is aware that Medication needs PA- due to new insurance. PA is sent to [facility].

## 2024-05-12 NOTE — Telephone Encounter (Signed)
 Received fax from Tremfya  with copay card information:

## 2024-05-14 ENCOUNTER — Other Ambulatory Visit: Payer: Self-pay

## 2024-05-14 ENCOUNTER — Other Ambulatory Visit (HOSPITAL_COMMUNITY): Payer: Self-pay

## 2024-05-15 ENCOUNTER — Other Ambulatory Visit (HOSPITAL_COMMUNITY): Payer: Self-pay

## 2024-05-16 ENCOUNTER — Other Ambulatory Visit (HOSPITAL_COMMUNITY): Payer: Self-pay

## 2024-05-16 ENCOUNTER — Other Ambulatory Visit: Payer: Self-pay

## 2024-05-19 ENCOUNTER — Other Ambulatory Visit: Payer: Self-pay

## 2024-05-20 ENCOUNTER — Other Ambulatory Visit: Payer: Self-pay

## 2024-05-20 ENCOUNTER — Telehealth: Payer: Self-pay

## 2024-05-20 ENCOUNTER — Other Ambulatory Visit (HOSPITAL_COMMUNITY): Payer: Self-pay

## 2024-05-20 NOTE — Telephone Encounter (Signed)
 Received notification from [facility] pharmacy that patient has obtained new insurance and requires a new authorization for their medication.  Submitted an URGENT Prior Authorization request to [facility] for TREMFYA  via CoverMyMeds. Will update once we receive a response.  Key: [id number]

## 2024-05-20 NOTE — Progress Notes (Signed)
 lvm- PA approved, patient needs to confirm new delivery date since med is cold chain

## 2024-05-20 NOTE — Telephone Encounter (Signed)
 Approval letter sent to media tab

## 2024-05-20 NOTE — Telephone Encounter (Signed)
 Received notification from [facility] regarding a prior authorization for TREMFYA . Authorization has been APPROVED from 05/20/2024 to 05/20/2025.    Authorization # [id number]   Shavod at [facility] has been notified.

## 2024-05-20 NOTE — Progress Notes (Signed)
 Patient called back & aware we will ship 7/16 for 7/17 with copay $0.

## 2024-05-21 ENCOUNTER — Other Ambulatory Visit: Payer: Self-pay

## 2024-05-21 DIAGNOSIS — L409 Psoriasis, unspecified: Secondary | ICD-10-CM

## 2024-05-21 DIAGNOSIS — L4050 Arthropathic psoriasis, unspecified: Secondary | ICD-10-CM

## 2024-05-21 MED ORDER — TREMFYA ONE-PRESS 100 MG/ML ~~LOC~~ SOAJ
100.0000 mg | SUBCUTANEOUS | 0 refills | Status: DC
  Filled 2024-05-21 – 2024-07-21 (×3): qty 1, 56d supply, fill #0

## 2024-05-21 NOTE — Telephone Encounter (Signed)
 Last Fill: 03/26/2024  Labs: 04/28/2024 CBC and CMP WNL   TB Gold: 08/31/2023 TB Gold Negative    Next Visit: 07/01/2024  Last Visit: 01/29/2024  DX: Psoriatic arthropathy   Current Dose per office note 01/29/2024: Tremfya  100 mg sq injections every 8 weeks   Okay to refill Tremfyia?

## 2024-05-22 ENCOUNTER — Other Ambulatory Visit (HOSPITAL_COMMUNITY): Payer: Self-pay

## 2024-06-17 ENCOUNTER — Other Ambulatory Visit: Payer: Self-pay

## 2024-06-17 NOTE — Progress Notes (Unsigned)
 Office Visit Note  Patient: Eric Lewis             Date of Birth: 1971-01-19           MRN: [id number]             PCP: Regino Slater, MD Referring: Regino Slater, MD Visit Date: 07/01/2024 Occupation: @GUAROCC @  Subjective:  Occasional psoriasis on scalp  History of Present Illness: Eric Lewis is a 53 y.o. male with history of psoriatic arthritis.  Patient remains on Tremfya  100 mg sq inj every 8 weeks.  He is tolerating Tremfya  without any side effects or injection site reactions.  He is due for his next dose of Tremfya  at the end of September 2025.  Patient states that he had been noticing breakthrough patches of psoriasis on his scalp about 1 month prior to being due for his Tremfya  injection but states that so far this month he has not had any breakthrough symptoms.  He experiences occasional stiffness in the fifth PIP joint bilaterally but denies any other joint pain or joint swelling at this time.  He denies any SI joint discomfort.  He denies any Achilles tendinitis or plantar fasciitis.  He denies any joint swelling at this time.  He denies any recent or recurrent infections.    Activities of Daily Living:  Patient reports morning stiffness for 0 minute.   Patient Denies nocturnal pain.  Difficulty dressing/grooming: Denies Difficulty climbing stairs: Denies Difficulty getting out of chair: Denies Difficulty using hands for taps, buttons, cutlery, and/or writing: Denies  Review of Systems  Constitutional:  Negative for fatigue.  HENT:  Negative for mouth sores and mouth dryness.   Eyes:  Negative for dryness.  Respiratory:  Negative for shortness of breath.   Cardiovascular:  Negative for chest pain and palpitations.  Gastrointestinal:  Negative for blood in stool, constipation and diarrhea.  Endocrine: Negative for increased urination.  Genitourinary:  Negative for involuntary urination.  Musculoskeletal:  Positive for muscle tenderness. Negative for  joint pain, gait problem, joint pain, joint swelling, myalgias, muscle weakness, morning stiffness and myalgias.  Skin:  Positive for sensitivity to sunlight. Negative for color change, rash and hair loss.  Allergic/Immunologic: Negative for susceptible to infections.  Neurological:  Negative for dizziness and headaches.  Hematological:  Negative for swollen glands.  Psychiatric/Behavioral:  Negative for depressed mood and sleep disturbance. The patient is not nervous/anxious.     PMFS History:  Patient Active Problem List   Diagnosis Date Noted   Encounter for antineoplastic chemotherapy 05/03/2021   Genetic testing 03/20/2021   Family history of breast cancer    Family history of prostate cancer    Adenocarcinoma of rectum (HCC) 02/24/2021   Septic arthritis of knee, right (HCC) 09/30/2014   Diabetes mellitus (HCC) 09/30/2014   Dyslipidemia 09/30/2014   Obesity 09/30/2014    Past Medical History:  Diagnosis Date   COVID jan 30 th 2022   fever x 4 days all sx resolved   DM type 2 (diabetes mellitus, type 2) (HCC)    Family history of breast cancer    Family history of prostate cancer    History of hypertension    no meds x 1 year due to weight loss as of 03-16-2021   Hypercholesteremia    Obesity    Psoriatic arthritis (HCC)    Rectal cancer (HCC)    Umbilical hernia     Family History  Problem Relation Age of Onset  Emphysema Father    Prostate cancer Paternal Uncle 53   Emphysema Maternal Grandmother    Breast cancer Paternal Grandmother        dx 45s   Past Surgical History:  Procedure Laterality Date   CHONDROPLASTY Right 09/28/2014   Procedure: CHONDROPLASTY, PATELLA/FEMORAL;  Surgeon: Norleen LITTIE Gavel, MD;  Location: [facility];  Service: Orthopedics;  Laterality: Right;   colonscopy  03-17-2021   [facility]   ILEOSTOMY CLOSURE  03/27/2023   IR CV LINE INJECTION  09/23/2021   IR REMOVAL TUN ACCESS W/ PORT W/O FL MOD SED  09/30/2021   KNEE ARTHROSCOPY  WITH MEDIAL MENISECTOMY Right 09/28/2014   Procedure: KNEE ARTHROSCOPY WITH MEDIAL MENISECTOMY;  Surgeon: Norleen LITTIE Gavel, MD;  Location: [facility];  Service: Orthopedics;  Laterality: Right;   PORTACATH PLACEMENT Right 03/18/2021   Procedure: PLACEMENT PORT-A-CATH;  Surgeon: Teresa Lonni HERO, MD;  Location: [facility];  Service: General;  Laterality: Right;   ROBOTIC COLECTOMY, PARTIAL, WITH ANASTOMOSIS, WITH COLOPROCTOSTOMY (LOW PELVIC ANASTOMOSIS) WITH IIEOSTOMY  10/20/2021   [facility]   SYNOVECTOMY Right 09/28/2014   Procedure: SYNOVECTOMY, THREE COMPARTMENT;  Surgeon: Norleen LITTIE Gavel, MD;  Location: [facility];  Service: Orthopedics;  Laterality: Right;   Social History   Social History Narrative   Reports he is the middle child of two other siblings. Lives alone and works from home in [employer].      Right Handed    Lives in a two story home    Immunization History  Administered Date(s) Administered   Influenza Split 07/27/2014   PFIZER(Purple Top)SARS-COV-2 Vaccination 02/05/2020, 03/01/2020   Pneumococcal Polysaccharide-23 12/04/2014   Tdap 09/09/2012, 05/08/2022     Objective: Vital Signs: BP 118/75 (BP Location: Left Arm, Patient Position: Sitting, Cuff Size: Normal)   Pulse 75   Resp 14   Ht 5' 7.5 (1.715 m)   Wt 206 lb (93.4 kg)   BMI 31.79 kg/m    Physical Exam Vitals and nursing note reviewed.  Constitutional:      Appearance: He is well-developed.  HENT:     Head: Normocephalic and atraumatic.  Eyes:     Conjunctiva/sclera: Conjunctivae normal.     Pupils: Pupils are equal, round, and reactive to light.  Cardiovascular:     Rate and Rhythm: Normal rate and regular rhythm.     Heart sounds: Normal heart sounds.  Pulmonary:     Effort: Pulmonary effort is normal.     Breath sounds: Normal breath sounds.  Abdominal:     General: Bowel sounds are normal.     Palpations: Abdomen is soft.  Musculoskeletal:      Cervical back: Normal range of motion and neck supple.  Skin:    General: Skin is warm and dry.     Capillary Refill: Capillary refill takes less than 2 seconds.  Neurological:     Mental Status: He is alert and oriented to person, place, and time.  Psychiatric:        Behavior: Behavior normal.      Musculoskeletal Exam: C-spine has slightly limited range of motion without rotation.  Thoracic spine and lumbar spine good range of motion.  No midline spinal tenderness.  No SI joint tenderness.  Shoulder joints have good ROM. Flexion contractures of  elbow joints. Wrist joints, MCPs, PIPs, DIPs have good range of motion with no synovitis.  Complete fist formation bilaterally.  Hip joints have good range of motion with  no groin pain.  Knee joints have good range of motion no warmth or effusion.  Ankle joints have good range of motion with no synovitis.  No evidence of Achilles tendinitis or plantar fasciitis.  CDAI Exam: CDAI Score: -- Patient Global: --; Provider Global: -- Swollen: --; Tender: -- Joint Exam 07/01/2024   No joint exam has been documented for this visit   There is currently no information documented on the homunculus. Go to the Rheumatology activity and complete the homunculus joint exam.  Investigation: No additional findings.  Imaging: No results found.  Recent Labs: Lab Results  Component Value Date   WBC 6.8 04/28/2024   HGB 14.7 04/28/2024   PLT 208 04/28/2024   NA 140 04/28/2024   K 4.4 04/28/2024   CL 103 04/28/2024   CO2 28 04/28/2024   GLUCOSE 87 04/28/2024   BUN 11 04/28/2024   CREATININE 0.96 04/28/2024   BILITOT 0.6 04/28/2024   ALKPHOS 66 09/15/2021   AST 17 04/28/2024   ALT 22 04/28/2024   PROT 6.2 04/28/2024   ALBUMIN 4.4 09/15/2021   CALCIUM  9.3 04/28/2024   GFRAA >60 05/30/2016   QFTBGOLDPLUS NEGATIVE 08/31/2023    Speciality Comments: No specialty comments available.  Procedures:  No procedures performed Allergies: Oxaliplatin   and Lisinopril     Assessment / Plan:     Visit Diagnoses: Psoriatic arthropathy (HCC): He has no synovitis or dactylitis on examination today.  No evidence of Achilles tendinitis or plantar fasciitis.  No SI joint tenderness upon palpation.  He has not been experiencing any morning stiffness, nocturnal pain, or difficulty performing ADLs.  He remains on Tremfya  100 mg sq injections once every 8 weeks.  According to the patient with the last few doses of Tremfya  he has noticed breakthrough symptoms of psoriasis on his scalp about 1 month prior to when he is due for his dose.  He is not due for his next dose of Tremfya  until the end of September but has not yet noticed any breakthrough symptoms of psoriasis so he is hopeful that Tremfya  will be more effective at controlling his psoriasis going forward.  A prescription for clobetasol  solution was sent to the pharmacy to use as needed if he develops any recurrence of psoriasis on his scalp.  He will notify us  if he develops any new or worsening symptoms.  He does not want to make any medication changes at this time.  We can discuss combination therapy in the future if needed.  He will follow-up in the office in 5 months or sooner if needed.  Psoriasis - History of psoriasis on the scalp, behind both ears, and gluteal crease-improved since initiating Tremfya . He has noticed some breakthrough patches of psoriasis on his scalp about 1 month prior to his Tremfya  dose in the past.  As of this month he has not had any breakthrough symptoms yet.  He was given a prescription for clobetasol  solution which she can apply to the scalp as needed if he has any breakthrough patches.  He will remain on Tremfya  as monotherapy for now.  High risk medication use - Tremfya  100 mg sq inj every 8 weeks.  Previous tx: Otezla  started May 2017-October 2024 patient d/c due to chronic diarrhea.  Tremfya  was initiated on 09/10/2023 CBC and CMP updated on 04/28/24.  His next lab work  will be due in September and every 3 months.  TB gold negative on 08/31/23. No infections and recurrent infections.  Discussed the importance of  holding tremfya  if he develops signs or symptoms of an infection and to resume once the infection has completely cleared.   Contracture of joint of both elbows - Unchanged.  No tenderness or inflammation along the joint line.  Pain in both hands: He experiences some soreness and stiffness in the fifth PIP joint bilaterally.  No synovitis or dactylitis noted on exam.  Complete fist formation noted bilaterally.  Discussed use Voltaren gel which she can apply topically as needed for symptomatic relief.  He will notify us  if the symptoms persist or worsen.  Pain in both feet - X-rays of both feet from 08/31/2023 were consistent with osteoarthritis.  Possible erosion was noted over the left fifth metatarsal head.  No Achilles tendinitis or plantar fasciitis.  Chronic pain of both ankles: Patient has good range of motion of both ankle joints on examination today.  No evidence of Achilles tendinitis or plantar fasciitis.  Other medical conditions are listed as follows:  History of diabetes mellitus  Dyslipidemia  Adenocarcinoma of rectum (HCC)  Neuropathy  Fatty liver  Umbilical hernia without obstruction and without gangrene  History of esophageal stricture  Orders: No orders of the defined types were placed in this encounter.  Meds ordered this encounter  Medications   clobetasol  (TEMOVATE ) 0.05 % external solution    Sig: Apply 1 Application topically 2 (two) times daily.    Dispense:  50 mL    Refill:  0     Follow-Up Instructions: Return in about 5 months (around 12/01/2024) for Psoriatic arthritis.   Waddell CHRISTELLA Craze, PA-C  Note - This record has been created using Dragon software.  Chart creation errors have been sought, but may not always  have been located. Such creation errors do not reflect on  the standard of medical  care.

## 2024-07-01 ENCOUNTER — Encounter: Payer: Self-pay | Admitting: Physician Assistant

## 2024-07-01 ENCOUNTER — Ambulatory Visit: Attending: Physician Assistant | Admitting: Physician Assistant

## 2024-07-01 VITALS — BP 118/75 | HR 75 | Resp 14 | Ht 67.5 in | Wt 206.0 lb

## 2024-07-01 DIAGNOSIS — M24522 Contracture, left elbow: Secondary | ICD-10-CM

## 2024-07-01 DIAGNOSIS — M79642 Pain in left hand: Secondary | ICD-10-CM

## 2024-07-01 DIAGNOSIS — Z79899 Other long term (current) drug therapy: Secondary | ICD-10-CM

## 2024-07-01 DIAGNOSIS — M25571 Pain in right ankle and joints of right foot: Secondary | ICD-10-CM

## 2024-07-01 DIAGNOSIS — E785 Hyperlipidemia, unspecified: Secondary | ICD-10-CM

## 2024-07-01 DIAGNOSIS — M79672 Pain in left foot: Secondary | ICD-10-CM

## 2024-07-01 DIAGNOSIS — L4050 Arthropathic psoriasis, unspecified: Secondary | ICD-10-CM | POA: Diagnosis not present

## 2024-07-01 DIAGNOSIS — K429 Umbilical hernia without obstruction or gangrene: Secondary | ICD-10-CM

## 2024-07-01 DIAGNOSIS — M24521 Contracture, right elbow: Secondary | ICD-10-CM

## 2024-07-01 DIAGNOSIS — Z8719 Personal history of other diseases of the digestive system: Secondary | ICD-10-CM

## 2024-07-01 DIAGNOSIS — Z8639 Personal history of other endocrine, nutritional and metabolic disease: Secondary | ICD-10-CM

## 2024-07-01 DIAGNOSIS — G629 Polyneuropathy, unspecified: Secondary | ICD-10-CM

## 2024-07-01 DIAGNOSIS — K760 Fatty (change of) liver, not elsewhere classified: Secondary | ICD-10-CM

## 2024-07-01 DIAGNOSIS — L409 Psoriasis, unspecified: Secondary | ICD-10-CM

## 2024-07-01 DIAGNOSIS — M25572 Pain in left ankle and joints of left foot: Secondary | ICD-10-CM

## 2024-07-01 DIAGNOSIS — C20 Malignant neoplasm of rectum: Secondary | ICD-10-CM

## 2024-07-01 DIAGNOSIS — M79641 Pain in right hand: Secondary | ICD-10-CM

## 2024-07-01 DIAGNOSIS — M79671 Pain in right foot: Secondary | ICD-10-CM

## 2024-07-01 DIAGNOSIS — G8929 Other chronic pain: Secondary | ICD-10-CM

## 2024-07-01 MED ORDER — CLOBETASOL PROPIONATE 0.05 % EX SOLN: 1.0000 | Freq: Two times a day (BID) | CUTANEOUS | 0 refills | Status: AC

## 2024-07-01 NOTE — Patient Instructions (Signed)
 Standing Labs We placed an order today for your standing lab work.   Please have your standing labs drawn in September and every 3 months   Please have your labs drawn 2 weeks prior to your appointment so that the provider can discuss your lab results at your appointment, if possible.  Please note that you may see your imaging and lab results in MyChart before we have reviewed them. We will contact you once all results are reviewed. Please allow our office up to 72 hours to thoroughly review all of the results before contacting the office for clarification of your results.  WALK-IN LAB HOURS  Monday through Thursday from 8:00 am -12:30 pm and 1:00 pm-4:30 pm and Friday from 8:00 am-12:00 pm.  Patients with office visits requiring labs will be seen before walk-in labs.  You may encounter longer than normal wait times. Please allow additional time. Wait times may be shorter on  Monday and Thursday afternoons.  We do not book appointments for walk-in labs. We appreciate your patience and understanding with our staff.   Labs are drawn by [facility]. Please bring your co-pay at the time of your lab draw.  You may receive a bill from [facility] for your lab work.  Please note if you are on Hydroxychloroquine and and an order has been placed for a Hydroxychloroquine level,  you will need to have it drawn 4 hours or more after your last dose.  If you wish to have your labs drawn at another location, please call the office 24 hours in advance so we can fax the orders.  The office is located at [address], [address], [place], [place] [address]   If you have any questions regarding directions or hours of operation,  please call [phone].   As a reminder, please drink plenty of water prior to coming for your lab work. Thanks!

## 2024-07-09 ENCOUNTER — Other Ambulatory Visit: Payer: Self-pay

## 2024-07-11 ENCOUNTER — Other Ambulatory Visit (HOSPITAL_COMMUNITY): Payer: Self-pay

## 2024-07-20 ENCOUNTER — Encounter (INDEPENDENT_AMBULATORY_CARE_PROVIDER_SITE_OTHER): Payer: Self-pay

## 2024-07-21 ENCOUNTER — Other Ambulatory Visit: Payer: Self-pay

## 2024-07-21 ENCOUNTER — Other Ambulatory Visit: Payer: Self-pay | Admitting: Pharmacy Technician

## 2024-07-21 NOTE — Progress Notes (Signed)
 Specialty Pharmacy Refill Coordination Note  CHANEL MCKESSON is a 53 y.o. male contacted today regarding refills of specialty medication(s) Guselkumab  (Tremfya  [employer])   Patient requested Delivery   Delivery date: 07/29/24   Verified address: [address] [place], [place]   Medication will be filled on 07/28/24. Injection due on 9/29. Questionnaire answered.

## 2024-07-22 ENCOUNTER — Ambulatory Visit (HOSPITAL_BASED_OUTPATIENT_CLINIC_OR_DEPARTMENT_OTHER)

## 2024-07-24 ENCOUNTER — Other Ambulatory Visit: Payer: Self-pay

## 2024-07-28 ENCOUNTER — Other Ambulatory Visit: Payer: Self-pay

## 2024-07-29 ENCOUNTER — Other Ambulatory Visit

## 2024-07-29 ENCOUNTER — Ambulatory Visit: Admitting: Oncology

## 2024-07-29 ENCOUNTER — Telehealth: Payer: Self-pay

## 2024-07-29 ENCOUNTER — Inpatient Hospital Stay (HOSPITAL_BASED_OUTPATIENT_CLINIC_OR_DEPARTMENT_OTHER): Admitting: Nurse Practitioner

## 2024-07-29 ENCOUNTER — Encounter: Payer: Self-pay | Admitting: Nurse Practitioner

## 2024-07-29 ENCOUNTER — Inpatient Hospital Stay: Attending: Nurse Practitioner

## 2024-07-29 VITALS — BP 120/88 | HR 71 | Temp 97.8°F | Resp 18 | Ht 67.5 in | Wt 209.6 lb

## 2024-07-29 DIAGNOSIS — Z8616 Personal history of COVID-19: Secondary | ICD-10-CM | POA: Diagnosis not present

## 2024-07-29 DIAGNOSIS — E119 Type 2 diabetes mellitus without complications: Secondary | ICD-10-CM | POA: Diagnosis not present

## 2024-07-29 DIAGNOSIS — C20 Malignant neoplasm of rectum: Secondary | ICD-10-CM | POA: Diagnosis not present

## 2024-07-29 LAB — BASIC METABOLIC PANEL - CANCER CENTER ONLY
Anion gap: 11 (ref 5–15)
BUN: 12 mg/dL (ref 6–20)
CO2: 23 mmol/L (ref 22–32)
Calcium: 9.5 mg/dL (ref 8.9–10.3)
Chloride: 104 mmol/L (ref 98–111)
Creatinine: 1.09 mg/dL (ref 0.61–1.24)
GFR, Estimated: >60 mL/min (ref >60)
Glucose, Bld: 96 mg/dL (ref 70–99)
Potassium: 4.7 mmol/L (ref 3.5–5.1)
Sodium: 138 mmol/L (ref 135–145)

## 2024-07-29 LAB — CEA (ACCESS): CEA (CHCC): 2.35 ng/mL (ref 0.00–5.00)

## 2024-07-29 NOTE — Telephone Encounter (Signed)
 Patient gave verbal understanding and had no further questions.

## 2024-07-29 NOTE — Telephone Encounter (Signed)
-----   Message from Olam Ned sent at 07/29/2024  2:47 PM EDT ----- Please let him know CEA is stable in normal range.  Follow-up as scheduled.

## 2024-07-29 NOTE — Progress Notes (Signed)
  [facility] OFFICE PROGRESS NOTE   Diagnosis: Rectal cancer  INTERVAL HISTORY:   Eric Lewis returns as scheduled.  He feels well.  He continues to have frequent bowel movements.  He has noted improvement since beginning Colestid.  He has a good appetite and good energy level.  No nausea or vomiting.  No rectal pain or bleeding.  He reports the left foot numbness/pain is well-controlled as long as he takes gabapentin.  He was unable to complete the recommended pelvic CT due to the cost.  Objective:  Vital signs in last 24 hours:  Blood pressure 120/88, pulse 71, temperature 97.8 F (36.6 C), temperature source Temporal, resp. rate 18, height 5' 7.5 (1.715 m), weight 209 lb 9.6 oz (95.1 kg), SpO2 98%.    Lymphatics: No palpable cervical, supraclavicular, axillary or inguinal lymph nodes. Resp: Lungs clear bilaterally. Cardio: Regular rate and rhythm.   GI: No hepatosplenomegaly.  No mass.  Nontender. Vascular: No leg edema.  Lab Results:  Lab Results  Component Value Date   WBC 6.8 04/28/2024   HGB 14.7 04/28/2024   HCT 44.9 04/28/2024   MCV 88.9 04/28/2024   PLT 208 04/28/2024   NEUTROABS 4,379 04/28/2024    Imaging:  No results found.  Medications: I have reviewed the patient's current medications.  Assessment/Plan:  Rectal cancer Colonoscopy 02/15/2021- nonobstructing mass at 2-3 cm in the anal verge, removed in a piecemeal fashion, pathology confirmed moderately differentiated adenocarcinoma CTs chest, abdomen, and pelvis on 02/17/2021- no evidence of metastatic disease, no clear mass identified, areas of mild rectal wall thickening MRI 03/02/2021- rectal tumor not identified, no extension beyond the muscularis identified.  N1 disease with multiple left mesorectal nodes, and a 7 mm node beyond the mesorectum at the hypogastric region Cycle 1 FOLFOX 03/22/2021 Cycle 2 FOLFOX 04/05/2021 Cycle 3 FOLFOX 04/19/2021 Cycle 4 FOLFOX 05/03/2021 Cycle 5 FOLFOX  05/17/2021 Cycle 6 FOLFOX 05/31/2021 Cycle 7 FOLFOX 06/14/2021-Oxaliplatin  infusion time lengthened, Benadryl  and Pepcid  added to premedications due to coughing, sneezing, throat clearing, diaphoresis during Oxaliplatin  cycle 6 Cycle 8 FOLFOX 06/28/2021 Radiation/Xeloda  07/12/2021-08/18/2021 MRI pelvis 08/25/2021 at [facility]-mid rectal tumor not definitely visualized with persistent irregular anterior rectal wall thickening and redemonstrated irregularity of the muscularis propria.  Decreased size of left mesorectal and left internal iliac lymph nodes. Low anterior resection and diverting loop ileostomy 10/20/2021,ypT0,ypN0, submucosal fibrosis and treatment effect without evidence of residual tumor MRI pelvis 12/20/2021-thick walled rim-enhancing gas and fluid collection in the presacral space communicating with the coloanal anastomosis consistent with a leak and small abscess MRI lumbar spine 01/08/2022-no abnormal enhancement of the vertebral bodies, conus medullaris, cauda equina, paraspinal structures CTs 03/03/2022-no metastatic disease. CTs 01/22/2023-no evidence of recurrent disease Colonoscopy 09/13/2023: Nodular erythematous and inflamed mucosa was found in the rectum, biopsy-inflammatory granulation tissue, negative for dysplasia, random colon biopsy with no significant pathologic diagnosis, negative for active colitis CTs 01/22/2024-no evidence of metastatic disease, increased presacral soft tissue Tumor board discussion 02/06/2024-surveillance CT with IV contrast 4 to 6 month interval   History of dysphagia-status post a barium swallow 10/22/2020- mild smooth stricture at the GE junction Psoriatic arthritis Diabetes COVID-19 January 22 Coughing, sneezing, throat clearing, diaphoresis during Oxaliplatin  infusion cycle 6 FOLFOX Anaphylactic reaction to oxaliplatin  06/28/2021 Removal of malfunctioning Port-A-Cath 09/30/2021 Left calf muscle injury during surgery 10/20/2021-MRI of the left  tibia/fibula-myonecrosis in the posterior muscle compartments History of rectal anastomotic leak-Gastrografin enema 12/20/2022-no leak Ileostomy takedown 03/27/2023     Disposition: [name] remains in clinical  remission from rectal cancer.  We will follow-up on the CEA from today.  We again reviewed the increased presacral soft tissue noted on CT 01/22/2024 and the recommendation for a follow-up CT at a 4 to 65-month interval.  The CT is cost prohibitive at present.  He feels it will be affordable toward the end of the year.  We will reschedule the CT scan for mid December.  He will let us  know if he can complete it sooner.  He will return for a CEA and follow-up visit in 6 months.  We are available to see him sooner if needed.    Olam Ned ANP/GNP-BC   07/29/2024  11:49 AM

## 2024-08-01 ENCOUNTER — Ambulatory Visit: Admitting: Oncology

## 2024-08-01 ENCOUNTER — Other Ambulatory Visit

## 2024-08-06 ENCOUNTER — Other Ambulatory Visit: Payer: Self-pay | Admitting: *Deleted

## 2024-08-06 DIAGNOSIS — L409 Psoriasis, unspecified: Secondary | ICD-10-CM

## 2024-08-06 DIAGNOSIS — L4050 Arthropathic psoriasis, unspecified: Secondary | ICD-10-CM

## 2024-08-06 DIAGNOSIS — Z111 Encounter for screening for respiratory tuberculosis: Secondary | ICD-10-CM

## 2024-08-06 DIAGNOSIS — Z79899 Other long term (current) drug therapy: Secondary | ICD-10-CM

## 2024-08-06 DIAGNOSIS — Z9225 Personal history of immunosupression therapy: Secondary | ICD-10-CM | POA: Diagnosis not present

## 2024-08-07 ENCOUNTER — Ambulatory Visit: Payer: Self-pay | Admitting: Rheumatology

## 2024-08-08 LAB — CBC WITH DIFFERENTIAL/PLATELET
Absolute Lymphocytes: 1442 {cells}/uL (ref 850–3900)
Absolute Monocytes: 477 {cells}/uL (ref 200–950)
Basophils Absolute: 69 {cells}/uL (ref 0–200)
Basophils Relative: 1.3 %
Eosinophils Absolute: 90 {cells}/uL (ref 15–500)
Eosinophils Relative: 1.7 %
HCT: 47.3 % (ref 38.5–50.0)
Hemoglobin: 16 g/dL (ref 13.2–17.1)
MCH: 29.6 pg (ref 27.0–33.0)
MCHC: 33.8 g/dL (ref 32.0–36.0)
MCV: 87.4 fL (ref 80.0–100.0)
MPV: 9.5 fL (ref 7.5–12.5)
Monocytes Relative: 9 %
Neutro Abs: 3222 {cells}/uL (ref 1500–7800)
Neutrophils Relative %: 60.8 %
Platelets: 198 Thousand/uL (ref 140–400)
RBC: 5.41 Million/uL (ref 4.20–5.80)
RDW: 12.6 % (ref 11.0–15.0)
Total Lymphocyte: 27.2 %
WBC: 5.3 Thousand/uL (ref 3.8–10.8)

## 2024-08-08 LAB — COMPLETE METABOLIC PANEL WITHOUT GFR
AG Ratio: 1.8 (calc) (ref 1.0–2.5)
ALT: 22 U/L (ref 9–46)
AST: 16 U/L (ref 10–35)
Albumin: 4.4 g/dL (ref 3.6–5.1)
Alkaline phosphatase (APISO): 76 U/L (ref 35–144)
BUN: 13 mg/dL (ref 7–25)
CO2: 32 mmol/L (ref 20–32)
Calcium: 9.6 mg/dL (ref 8.6–10.3)
Chloride: 102 mmol/L (ref 98–110)
Creat: 1.05 mg/dL (ref 0.70–1.30)
Globulin: 2.5 g/dL (ref 1.9–3.7)
Glucose, Bld: 79 mg/dL (ref 65–99)
Potassium: 5 mmol/L (ref 3.5–5.3)
Sodium: 139 mmol/L (ref 135–146)
Total Bilirubin: 0.8 mg/dL (ref 0.2–1.2)
Total Protein: 6.9 g/dL (ref 6.1–8.1)

## 2024-08-08 LAB — QUANTIFERON-TB GOLD PLUS
Mitogen-NIL: >10 [IU]/mL
NIL: 0.01 [IU]/mL
QuantiFERON-TB Gold Plus: NEGATIVE
TB1-NIL: 0 [IU]/mL
TB2-NIL: 0 [IU]/mL

## 2024-08-12 DIAGNOSIS — Z23 Encounter for immunization: Secondary | ICD-10-CM | POA: Diagnosis not present

## 2024-08-12 DIAGNOSIS — G5792 Unspecified mononeuropathy of left lower limb: Secondary | ICD-10-CM | POA: Diagnosis not present

## 2024-08-12 DIAGNOSIS — E7800 Pure hypercholesterolemia, unspecified: Secondary | ICD-10-CM | POA: Diagnosis not present

## 2024-08-12 DIAGNOSIS — E119 Type 2 diabetes mellitus without complications: Secondary | ICD-10-CM | POA: Diagnosis not present

## 2024-08-12 DIAGNOSIS — I10 Essential (primary) hypertension: Secondary | ICD-10-CM | POA: Diagnosis not present

## 2024-09-15 ENCOUNTER — Other Ambulatory Visit: Payer: Self-pay

## 2024-09-15 ENCOUNTER — Other Ambulatory Visit: Payer: Self-pay | Admitting: Rheumatology

## 2024-09-15 DIAGNOSIS — L4050 Arthropathic psoriasis, unspecified: Secondary | ICD-10-CM

## 2024-09-15 DIAGNOSIS — L409 Psoriasis, unspecified: Secondary | ICD-10-CM

## 2024-09-15 DIAGNOSIS — Z9221 Personal history of antineoplastic chemotherapy: Secondary | ICD-10-CM | POA: Diagnosis not present

## 2024-09-15 DIAGNOSIS — Z85048 Personal history of other malignant neoplasm of rectum, rectosigmoid junction, and anus: Secondary | ICD-10-CM | POA: Diagnosis not present

## 2024-09-15 DIAGNOSIS — Z923 Personal history of irradiation: Secondary | ICD-10-CM | POA: Diagnosis not present

## 2024-09-15 DIAGNOSIS — Z08 Encounter for follow-up examination after completed treatment for malignant neoplasm: Secondary | ICD-10-CM | POA: Diagnosis not present

## 2024-09-15 MED ORDER — TREMFYA ONE-PRESS 100 MG/ML ~~LOC~~ SOPN
100.0000 mg | PEN_INJECTOR | SUBCUTANEOUS | 0 refills | Status: DC
  Filled 2024-09-15 – 2024-09-17 (×2): qty 1, 56d supply, fill #0

## 2024-09-15 NOTE — Telephone Encounter (Signed)
 Last Fill: 05/21/2024  Labs: 08/06/2024 CBC & CMP WNL   TB Gold: 08/06/2024 negative    Next Visit: 12/03/2024  Last Visit: 07/01/2024  IK:Ednmpjupr arthropathy   Current Dose per office note on 07/01/2024: Tremfya  100 mg sq inj every 8 weeks.   Okay to refill Tremfya ?

## 2024-09-16 ENCOUNTER — Other Ambulatory Visit: Payer: Self-pay

## 2024-09-17 ENCOUNTER — Other Ambulatory Visit: Payer: Self-pay | Admitting: Pharmacy Technician

## 2024-09-17 ENCOUNTER — Other Ambulatory Visit: Payer: Self-pay

## 2024-09-17 NOTE — Progress Notes (Signed)
 Specialty Pharmacy Refill Coordination Note  Eric Lewis is a 53 y.o. male contacted today regarding refills of specialty medication(s) Guselkumab  (Tremfya  [employer])   Patient requested Delivery   Delivery date: 09/23/24   Verified address: [address]  [place]   Medication will be filled on: 09/22/24

## 2024-09-22 ENCOUNTER — Other Ambulatory Visit: Payer: Self-pay

## 2024-10-15 ENCOUNTER — Ambulatory Visit (HOSPITAL_BASED_OUTPATIENT_CLINIC_OR_DEPARTMENT_OTHER): Admission: RE | Admit: 2024-10-15 | Discharge: 2024-10-15 | Attending: Oncology | Admitting: Oncology

## 2024-10-15 DIAGNOSIS — C20 Malignant neoplasm of rectum: Secondary | ICD-10-CM | POA: Diagnosis present

## 2024-10-15 LAB — POCT I-STAT CREATININE: Creatinine, Ser: 1.1 mg/dL (ref 0.61–1.24)

## 2024-10-15 MED ORDER — IOHEXOL 300 MG/ML  SOLN
100.0000 mL | Freq: Once | INTRAMUSCULAR | Status: AC | PRN
  Administered 2024-10-15: 100 mL via INTRAVENOUS

## 2024-11-05 ENCOUNTER — Ambulatory Visit: Payer: Self-pay | Admitting: Oncology

## 2024-11-05 NOTE — Telephone Encounter (Signed)
 Notified of CT being stable w/no evidence of cancer progression. F/U on 3/24 as scheduled.

## 2024-11-11 ENCOUNTER — Other Ambulatory Visit: Payer: Self-pay

## 2024-11-11 ENCOUNTER — Other Ambulatory Visit: Payer: Self-pay | Admitting: Physician Assistant

## 2024-11-11 DIAGNOSIS — L4050 Arthropathic psoriasis, unspecified: Secondary | ICD-10-CM

## 2024-11-11 DIAGNOSIS — L409 Psoriasis, unspecified: Secondary | ICD-10-CM

## 2024-11-11 MED ORDER — TREMFYA ONE-PRESS 100 MG/ML ~~LOC~~ SOPN
100.0000 mg | PEN_INJECTOR | SUBCUTANEOUS | 0 refills | Status: AC
  Filled 2024-11-11 – 2024-11-27 (×2): qty 1, 56d supply, fill #0

## 2024-11-11 NOTE — Telephone Encounter (Signed)
 Last Fill: 09/15/2024  Labs: 08/06/2024 CBC and CMP WNL  TB Gold: 08/06/2024 Negative   Next Visit: 12/03/2024  Last Visit: 07/01/2024  IK:Ednmpjupr arthropathy   Current Dose per office note 07/01/2024: Tremfya  100 mg sq inj every 8 weeks.   Okay to refill Tremfyia?

## 2024-11-11 NOTE — Progress Notes (Signed)
 Specialty Pharmacy Refill Coordination Note  Eric Lewis is a 54 y.o. male contacted today regarding refills of specialty medication(s) Guselkumab  (Tremfya  One-[employer])   Patient requested Delivery   Delivery date: 11/25/24   Verified address: [address]  [place]   Medication will be filled on: 11/24/24  This fill date is pending response to refill request from provider. Patient is aware and if they have not received fill by intended date they must follow up with pharmacy.

## 2024-11-19 NOTE — Progress Notes (Signed)
 "  Office Visit Note  Patient: Eric Lewis             Date of Birth: 08-06-1971           MRN: [id number]             PCP: Regino Slater, MD Referring: Regino Slater, MD Visit Date: 12/03/2024 Occupation: Data Unavailable  Subjective:  Occasional psoriasis flares  History of Present Illness: Eric Lewis is a 54 y.o. male with psoriatic arthritis and psoriasis.  He returns today after his last visit in August 2025.  He states he has been on Tremfya  every 8 weeks without any interruption.  He states he had a delay of 2 days due to shipment this time.  He took his Tremfya  injection this morning.  He states he gets small breakouts of psoriasis just prior to the next Tremfya  injection.  He has not had any joint pain or joint swelling.  He denies any episodes of plantar fasciitis, Achilles tendinitis and uveitis.  He has topical agents which he uses on his hands and his feet.    Activities of Daily Living:  Patient reports morning stiffness for 0 minutes.   Patient Denies nocturnal pain.  Difficulty dressing/grooming: Denies Difficulty climbing stairs: Denies Difficulty getting out of chair: Denies Difficulty using hands for taps, buttons, cutlery, and/or writing: Denies  Review of Systems  Constitutional:  Negative for fatigue.  HENT:  Negative for mouth sores and mouth dryness.   Eyes:  Negative for dryness.  Respiratory:  Negative for shortness of breath.   Cardiovascular:  Negative for chest pain and palpitations.  Gastrointestinal:  Negative for blood in stool, constipation and diarrhea.  Endocrine: Negative for increased urination.  Genitourinary:  Negative for involuntary urination.  Musculoskeletal:  Negative for joint pain, gait problem, joint pain, joint swelling, myalgias, muscle weakness, morning stiffness, muscle tenderness and myalgias.  Skin:  Positive for color change, rash and sensitivity to sunlight. Negative for hair loss.  Allergic/Immunologic:  Negative for susceptible to infections.  Neurological:  Negative for dizziness and headaches.  Hematological:  Negative for swollen glands.  Psychiatric/Behavioral:  Negative for depressed mood and sleep disturbance. The patient is not nervous/anxious.     PMFS History:  Patient Active Problem List   Diagnosis Date Noted   Encounter for antineoplastic chemotherapy 05/03/2021   Genetic testing 03/20/2021   Family history of breast cancer    Family history of prostate cancer    Adenocarcinoma of rectum (HCC) 02/24/2021   Septic arthritis of knee, right (HCC) 09/30/2014   Diabetes mellitus (HCC) 09/30/2014   Dyslipidemia 09/30/2014   Obesity 09/30/2014    Past Medical History:  Diagnosis Date   COVID jan 30 th 2022   fever x 4 days all sx resolved   DM type 2 (diabetes mellitus, type 2) (HCC)    Family history of breast cancer    Family history of prostate cancer    History of hypertension    Lewis meds x 1 year due to weight loss as of 03-16-2021   Hypercholesteremia    Obesity    Psoriatic arthritis (HCC)    Rectal cancer (HCC)    Umbilical hernia     Family History  Problem Relation Age of Onset   Emphysema Father    Prostate cancer Paternal Uncle 34   Emphysema Maternal Grandmother    Breast cancer Paternal Grandmother        dx 46s   Past Surgical History:  Procedure Laterality Date   CHONDROPLASTY Right 09/28/2014   Procedure: CHONDROPLASTY, PATELLA/FEMORAL;  Surgeon: Norleen LITTIE Gavel, MD;  Location: [facility];  Service: Orthopedics;  Laterality: Right;   colonscopy  03-17-2021   [facility]   ILEOSTOMY CLOSURE  03/27/2023   IR CV LINE INJECTION  09/23/2021   IR REMOVAL TUN ACCESS W/ PORT W/O FL MOD SED  09/30/2021   KNEE ARTHROSCOPY WITH MEDIAL MENISECTOMY Right 09/28/2014   Procedure: KNEE ARTHROSCOPY WITH MEDIAL MENISECTOMY;  Surgeon: Norleen LITTIE Gavel, MD;  Location: [facility];  Service: Orthopedics;  Laterality: Right;   PORTACATH PLACEMENT  Right 03/18/2021   Procedure: PLACEMENT PORT-A-CATH;  Surgeon: Teresa Lonni HERO, MD;  Location[facility];  Service: General;  Laterality: Right;   ROBOTIC COLECTOMY, PARTIAL, WITH ANASTOMOSIS, WITH COLOPROCTOSTOMY (LOW PELVIC ANASTOMOSIS) WITH IIEOSTOMY  10/20/2021   [facility]   SYNOVECTOMY Right 09/28/2014   Procedure: SYNOVECTOMY, THREE COMPARTMENT;  Surgeon: Norleen LITTIE Gavel, MD;  Location: [facility];  Service: Orthopedics;  Laterality: Right;   Social History[1] Social History   Social History Narrative   Reports he is the middle child of two other siblings. 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      Right Handed    Lives in a two story home      Immunization History  Administered Date(s) Administered   Fluzone Influenza virus vaccine,trivalent (IIV3), split virus 07/27/2014   PFIZER(Purple Top)SARS-COV-2 Vaccination 02/05/2020, 03/01/2020   Pneumococcal Polysaccharide-23 12/04/2014   Tdap 09/09/2012, 05/08/2022     Objective: Vital Signs: BP 133/89   Pulse 87   Temp 98.4 F (36.9 C)   Resp 16   Ht 5' 7 (1.702 m)   Wt 214 lb 9.6 oz (97.3 kg)   BMI 33.61 kg/m    Physical Exam Vitals and nursing note reviewed.  Constitutional:      Appearance: He is well-developed.  HENT:     Head: Normocephalic and atraumatic.  Eyes:     Conjunctiva/sclera: Conjunctivae normal.     Pupils: Pupils are equal, round, and reactive to light.  Cardiovascular:     Rate and Rhythm: Normal rate and regular rhythm.     Heart sounds: Normal heart sounds.  Pulmonary:     Effort: Pulmonary effort is normal.     Breath sounds: Normal breath sounds.  Abdominal:     General: Bowel sounds are normal.     Palpations: Abdomen is soft.  Musculoskeletal:     Cervical back: Normal range of motion and neck supple.  Skin:    General: Skin is warm and dry.     Capillary Refill: Capillary refill takes less than 2 seconds.     Comments: Dry skin is noted on  his hands and his feet.  Neurological:     Mental Status: He is alert and oriented to person, place, and time.  Psychiatric:        Behavior: Behavior normal.      Musculoskeletal Exam: Cervical, thoracic and lumbar spine were in good range of motion.  There was Lewis SI joint tenderness.  Shoulder joints, wrist joints, MCPs, PIPs and DIPs were in good range of motion with Lewis synovitis.  About 5 degree contractures were noted of bilateral elbows without any synovitis.  Hip joints and knee joints were in good range of motion without any warmth swelling or effusion.  Lewis Achilles tendinitis or plantar fasciitis was noted.  There was Lewis tenderness over  ankles or MTPs.   CDAI Exam: CDAI Score: -- Patient Global: --; Provider Global: -- Swollen: --; Tender: -- Joint Exam 12/03/2024   Lewis joint exam has been documented for this visit   There is currently Lewis information documented on the homunculus. Go to the Rheumatology activity and complete the homunculus joint exam.  Investigation: Lewis additional findings.  Imaging: Lewis results found.  Recent Labs: Lab Results  Component Value Date   WBC 5.3 08/06/2024   HGB 16.0 08/06/2024   PLT 198 08/06/2024   NA 139 08/06/2024   K 5.0 08/06/2024   CL 102 08/06/2024   CO2 32 08/06/2024   GLUCOSE 79 08/06/2024   BUN 13 08/06/2024   CREATININE 1.10 10/15/2024   BILITOT 0.8 08/06/2024   ALKPHOS 66 09/15/2021   AST 16 08/06/2024   ALT 22 08/06/2024   PROT 6.9 08/06/2024   ALBUMIN 4.4 09/15/2021   CALCIUM  9.6 08/06/2024   GFRAA >60 05/30/2016   QFTBGOLDPLUS NEGATIVE 08/06/2024   October 2025 hemoglobin A1c 5.3 Speciality Comments: Lewis specialty comments available.  Procedures:  Lewis procedures performed Allergies: Oxaliplatin  and Lisinopril   Assessment / Plan:     Visit Diagnoses: Psoriatic arthropathy (HCC)-patient denies having a flare of psoriatic arthritis.  He has been been taking Tremfya  without any interruption.  He states he will  only delay after 2 days due to the shipment of Tremfya .  He denies any episodes of plantar fasciitis or Achilles tendinitis.  Lewis synovitis or tendinitis was noted on the examination.  Lewis dactylitis was noted.  Psoriasis-patient reports breakouts of psoriasis right before Tremfya  injection.  He states sometimes he gets rash on his scalp and sometimes on his hands.  He uses topical agents triamcinolone 0.1% cream and clobetasol  0.05% external solution for scalp.   High risk medication use - Tremfya  100 mg sq inj every 8 weeks. Previous tx: Otezla started May 2017-October 2024 patient d/c due to chronic diarrhea. -August 06, 2024 CBC and CMP were normal and TB Gold was negative.  Will check labs today and every 3 months to monitor for drug toxicity.  Information regarding immunization was placed in the AVS.  Plan: CBC with Differential/Platelet, Comprehensive metabolic panel with GFR  Contracture of joint of both elbows-he has about 5 degree contracture in bilateral elbows without any synovitis.  Pain in both hands-he denies any discomfort today.  Pain in both feet -Lewis tenderness was noted on the examination today.  X-rays of both feet from 08/31/2023 were consistent with osteoarthritis.  Possible erosion was noted over the left fifth metatarsal head.  Type 2 diabetes mellitus without complication, without long-term current use of insulin (HCC) - Plan: Hemoglobin A1c  Neuropathy-he reports mild symptoms.  Other medical problems are listed as follows:  Adenocarcinoma of rectum (HCC)  Dyslipidemia-monitored by his PCP.  Dietary modifications and exercise was emphasized.  Association of heart disease with psoriatic arthritis was discussed.  Fatty liver  Umbilical hernia without obstruction and without gangrene  History of esophageal stricture  Orders: Orders Placed This Encounter  Procedures   CBC with Differential/Platelet   Comprehensive metabolic panel with GFR   Hemoglobin A1c   Lewis  orders of the defined types were placed in this encounter.    Follow-Up Instructions: Return in about 5 months (around 05/03/2025) for Psoriatic arthritis.   Maya Nash, MD  Note - This record has been created using Animal nutritionist.  Chart creation errors have been sought, but may not always  have been  located. Such creation errors do not reflect on  the standard of medical care.     [1]  Social History Tobacco Use   Smoking status: Never    Passive exposure: Past   Smokeless tobacco: Never  Vaping Use   Vaping status: Never Used  Substance Use Topics   Alcohol use: Lewis    Alcohol/week: 0.0 standard drinks of alcohol   Drug use: Lewis   "

## 2024-11-24 ENCOUNTER — Other Ambulatory Visit: Payer: Self-pay

## 2024-11-24 ENCOUNTER — Other Ambulatory Visit (HOSPITAL_COMMUNITY): Payer: Self-pay

## 2024-11-27 ENCOUNTER — Other Ambulatory Visit: Payer: Self-pay

## 2024-11-27 ENCOUNTER — Telehealth: Payer: Self-pay | Admitting: Pharmacist

## 2024-11-27 NOTE — Progress Notes (Signed)
 Specialty Pharmacy Refill Coordination Note  Eric Lewis is a 54 y.o. male contacted today regarding refills of specialty medication(s) Guselkumab  (Tremfya  One-Press)   Patient requested Delivery   Delivery date: 12/03/24   Verified address: [address]  [place]   Medication will be filled on: 12/02/24

## 2024-11-27 NOTE — Progress Notes (Signed)
 Submitted a Prior Authorization request to [employer] for TREMFYA  via CoverMyMeds. Will update once we receive a response.  Key: [id number]

## 2024-11-27 NOTE — Progress Notes (Signed)
 PA approved. Please reprocess

## 2024-11-27 NOTE — Telephone Encounter (Signed)
 Received notification from [employer] regarding a prior authorization for TREMFYA . Authorization has been APPROVED from 11/27/2024 to 11/27/2025. Approval letter sent to scan center.  Patient can continue to fill through [facility]: [phone]   Authorization # [id number]

## 2024-11-27 NOTE — Telephone Encounter (Signed)
 Submitted a Prior Authorization request to [employer] for TREMFYA  via CoverMyMeds. Will update once we receive a response.  Key: [id number]

## 2024-12-01 ENCOUNTER — Other Ambulatory Visit: Payer: Self-pay

## 2024-12-02 ENCOUNTER — Other Ambulatory Visit (HOSPITAL_COMMUNITY): Payer: Self-pay

## 2024-12-02 ENCOUNTER — Other Ambulatory Visit: Payer: Self-pay

## 2024-12-03 ENCOUNTER — Ambulatory Visit: Attending: Rheumatology | Admitting: Rheumatology

## 2024-12-03 ENCOUNTER — Encounter: Payer: Self-pay | Admitting: Rheumatology

## 2024-12-03 VITALS — BP 133/89 | HR 87 | Temp 98.4°F | Resp 16 | Ht 67.0 in | Wt 214.6 lb

## 2024-12-03 DIAGNOSIS — E119 Type 2 diabetes mellitus without complications: Secondary | ICD-10-CM | POA: Diagnosis present

## 2024-12-03 DIAGNOSIS — Z79899 Other long term (current) drug therapy: Secondary | ICD-10-CM | POA: Diagnosis present

## 2024-12-03 DIAGNOSIS — M24521 Contracture, right elbow: Secondary | ICD-10-CM | POA: Diagnosis present

## 2024-12-03 DIAGNOSIS — Z8639 Personal history of other endocrine, nutritional and metabolic disease: Secondary | ICD-10-CM | POA: Diagnosis present

## 2024-12-03 DIAGNOSIS — K429 Umbilical hernia without obstruction or gangrene: Secondary | ICD-10-CM | POA: Insufficient documentation

## 2024-12-03 DIAGNOSIS — E785 Hyperlipidemia, unspecified: Secondary | ICD-10-CM | POA: Insufficient documentation

## 2024-12-03 DIAGNOSIS — M79671 Pain in right foot: Secondary | ICD-10-CM | POA: Diagnosis present

## 2024-12-03 DIAGNOSIS — M24522 Contracture, left elbow: Secondary | ICD-10-CM | POA: Diagnosis present

## 2024-12-03 DIAGNOSIS — L409 Psoriasis, unspecified: Secondary | ICD-10-CM | POA: Diagnosis present

## 2024-12-03 DIAGNOSIS — M79672 Pain in left foot: Secondary | ICD-10-CM | POA: Diagnosis present

## 2024-12-03 DIAGNOSIS — M79642 Pain in left hand: Secondary | ICD-10-CM | POA: Diagnosis present

## 2024-12-03 DIAGNOSIS — Z8719 Personal history of other diseases of the digestive system: Secondary | ICD-10-CM | POA: Diagnosis present

## 2024-12-03 DIAGNOSIS — G8929 Other chronic pain: Secondary | ICD-10-CM

## 2024-12-03 DIAGNOSIS — L4050 Arthropathic psoriasis, unspecified: Secondary | ICD-10-CM | POA: Diagnosis present

## 2024-12-03 DIAGNOSIS — G629 Polyneuropathy, unspecified: Secondary | ICD-10-CM | POA: Insufficient documentation

## 2024-12-03 DIAGNOSIS — K760 Fatty (change of) liver, not elsewhere classified: Secondary | ICD-10-CM | POA: Insufficient documentation

## 2024-12-03 DIAGNOSIS — C20 Malignant neoplasm of rectum: Secondary | ICD-10-CM | POA: Insufficient documentation

## 2024-12-03 DIAGNOSIS — M79641 Pain in right hand: Secondary | ICD-10-CM | POA: Insufficient documentation

## 2024-12-03 NOTE — Patient Instructions (Signed)
 Standing Labs We placed an order today for your standing lab work.   Please have your standing labs drawn in April and every 3 months  Please have your labs drawn 2 weeks prior to your appointment so that the provider can discuss your lab results at your appointment, if possible.  Please note that you may see your imaging and lab results in MyChart before we have reviewed them. We will contact you once all results are reviewed. Please allow our office up to 72 hours to thoroughly review all of the results before contacting the office for clarification of your results.  WALK-IN LAB HOURS  Monday through Thursday from 8:00 am - 4:30 pm and Friday from 8:00 am-12:00 pm.  Patients with office visits requiring labs will be seen before walk-in labs.  You may encounter longer than normal wait times. Please allow additional time. Wait times may be shorter on  Monday and Thursday afternoons.  We do not book appointments for walk-in labs. We appreciate your patience and understanding with our staff.   Labs are drawn by [facility]. Please bring your co-pay at the time of your lab draw.  You may receive a bill from [facility] for your lab work.  Please note if you are on Hydroxychloroquine and and an order has been placed for a Hydroxychloroquine level,  you will need to have it drawn 4 hours or more after your last dose.  If you wish to have your labs drawn at another location, please call the office 24 hours in advance so we can fax the orders.  The office is located at [address], [place], [place] [address]   If you have any questions regarding directions or hours of operation,  please call [phone].   As a reminder, please drink plenty of water prior to coming for your lab work. Thanks!   Vaccines You are taking a medication(s) that can suppress your immune system.  The following immunizations are recommended: Flu annually Covid-19  Td/Tdap (tetanus, diphtheria, pertussis) every  10 years Pneumonia (Prevnar 15 then Pneumovax 23 at least 1 year apart.  Alternatively, can take Prevnar 20 without needing additional dose) Shingrix: 2 doses from 4 weeks to 6 months apart  Please check with your PCP to make sure you are up to date.   If you have signs or symptoms of an infection or start antibiotics: First, call your PCP for workup of your infection. Hold your medication through the infection, until you complete your antibiotics, and until symptoms resolve if you take the following: Injectable medication (Actemra, Benlysta, Cimzia, Cosentyx, Enbrel, Humira, Kevzara, Orencia, Remicade, Simponi, Stelara, Taltz, Tremfya) Methotrexate Leflunomide (Arava) Mycophenolate (Cellcept) Earma, Olumiant, or Rinvoq

## 2024-12-04 ENCOUNTER — Ambulatory Visit: Payer: Self-pay | Admitting: Physician Assistant

## 2024-12-04 LAB — COMPREHENSIVE METABOLIC PANEL WITH GFR
AG Ratio: 2 (calc) (ref 1.0–2.5)
ALT: 30 U/L (ref 9–46)
AST: 24 U/L (ref 10–35)
Albumin: 4.5 g/dL (ref 3.6–5.1)
Alkaline phosphatase (APISO): 83 U/L (ref 35–144)
BUN: 12 mg/dL (ref 7–25)
CO2: 29 mmol/L (ref 20–32)
Calcium: 9.9 mg/dL (ref 8.6–10.3)
Chloride: 103 mmol/L (ref 98–110)
Creat: 1.04 mg/dL (ref 0.70–1.30)
Globulin: 2.3 g/dL (ref 1.9–3.7)
Glucose, Bld: 82 mg/dL (ref 65–99)
Potassium: 4.6 mmol/L (ref 3.5–5.3)
Sodium: 139 mmol/L (ref 135–146)
Total Bilirubin: 0.7 mg/dL (ref 0.2–1.2)
Total Protein: 6.8 g/dL (ref 6.1–8.1)
eGFR: 86 mL/min/{1.73_m2}

## 2024-12-04 LAB — CBC WITH DIFFERENTIAL/PLATELET
Absolute Lymphocytes: 1578 {cells}/uL (ref 850–3900)
Absolute Monocytes: 552 {cells}/uL (ref 200–950)
Basophils Absolute: 72 {cells}/uL (ref 0–200)
Basophils Relative: 1.2 %
Eosinophils Absolute: 102 {cells}/uL (ref 15–500)
Eosinophils Relative: 1.7 %
HCT: 50.1 % (ref 39.4–51.1)
Hemoglobin: 16.3 g/dL (ref 13.2–17.1)
MCH: 28.8 pg (ref 27.0–33.0)
MCHC: 32.5 g/dL (ref 31.6–35.4)
MCV: 88.5 fL (ref 81.4–101.7)
MPV: 9.5 fL (ref 7.5–12.5)
Monocytes Relative: 9.2 %
Neutro Abs: 3696 {cells}/uL (ref 1500–7800)
Neutrophils Relative %: 61.6 %
Platelets: 202 10*3/uL (ref 140–400)
RBC: 5.66 Million/uL (ref 4.20–5.80)
RDW: 12.5 % (ref 11.0–15.0)
Total Lymphocyte: 26.3 %
WBC: 6 10*3/uL (ref 3.8–10.8)

## 2024-12-04 LAB — HEMOGLOBIN A1C
Hgb A1c MFr Bld: 5.3 %
Mean Plasma Glucose: 105 mg/dL
eAG (mmol/L): 5.8 mmol/L

## 2024-12-08 ENCOUNTER — Other Ambulatory Visit: Payer: Self-pay

## 2024-12-08 ENCOUNTER — Other Ambulatory Visit: Payer: Self-pay | Admitting: Pharmacist

## 2024-12-08 NOTE — Progress Notes (Signed)
 Specialty Pharmacy Ongoing Clinical Assessment Note  Eric Lewis is a 54 y.o. male who is being followed by the specialty pharmacy service for RxSp Psoriatic Arthritis   Patient's specialty medication(s) reviewed today: Guselkumab  (Tremfya  One-Press)   Missed doses in the last 4 weeks: 0   Patient/Caregiver did not have any additional questions or concerns.   Therapeutic benefit summary: Patient is achieving benefit   Adverse events/side effects summary: No adverse events/side effects   Patient's therapy is appropriate to: Continue    Goals Addressed             This Visit's Progress    Maintain optimal adherence to therapy   On track    Patient is initiating therapy. Patient will maintain adherence         Follow up: 12 months  Lyle LELON Chalk Specialty Pharmacist

## 2024-12-09 ENCOUNTER — Other Ambulatory Visit: Payer: Self-pay

## 2025-01-27 ENCOUNTER — Other Ambulatory Visit

## 2025-01-27 ENCOUNTER — Ambulatory Visit: Admitting: Oncology

## 2025-05-05 ENCOUNTER — Ambulatory Visit: Admitting: Physician Assistant
